# Patient Record
Sex: Female | Born: 1940 | Race: Black or African American | Hispanic: No | Smoking: Never smoker
Health system: Southern US, Community
[De-identification: ages and names within clinical notes are randomized; demographics above are authoritative.]

## PROBLEM LIST (undated history)

## (undated) DIAGNOSIS — I10 Essential (primary) hypertension: Secondary | ICD-10-CM

## (undated) DIAGNOSIS — H269 Unspecified cataract: Secondary | ICD-10-CM

## (undated) DIAGNOSIS — M1990 Unspecified osteoarthritis, unspecified site: Secondary | ICD-10-CM

## (undated) DIAGNOSIS — N289 Disorder of kidney and ureter, unspecified: Secondary | ICD-10-CM

## (undated) DIAGNOSIS — R7302 Impaired glucose tolerance (oral): Secondary | ICD-10-CM

## (undated) HISTORY — DX: Essential (primary) hypertension: I10

## (undated) HISTORY — DX: Impaired glucose tolerance (oral): R73.02

## (undated) HISTORY — DX: Unspecified osteoarthritis, unspecified site: M19.90

## (undated) HISTORY — DX: Disorder of kidney and ureter, unspecified: N28.9

## (undated) HISTORY — PX: COLONOSCOPY: SHX174

## (undated) HISTORY — DX: Unspecified cataract: H26.9

---

## 2012-02-24 ENCOUNTER — Ambulatory Visit (INDEPENDENT_AMBULATORY_CARE_PROVIDER_SITE_OTHER): Payer: Medicare Other | Admitting: Emergency Medicine

## 2012-02-24 VITALS — BP 160/80 | HR 88 | Temp 98.3°F | Resp 18 | Ht 65.5 in | Wt 266.0 lb

## 2012-02-24 DIAGNOSIS — J40 Bronchitis, not specified as acute or chronic: Secondary | ICD-10-CM | POA: Diagnosis not present

## 2012-02-24 DIAGNOSIS — E539 Vitamin B deficiency, unspecified: Secondary | ICD-10-CM

## 2012-02-24 DIAGNOSIS — J018 Other acute sinusitis: Secondary | ICD-10-CM

## 2012-02-24 DIAGNOSIS — J329 Chronic sinusitis, unspecified: Secondary | ICD-10-CM

## 2012-02-24 MED ORDER — AMOXICILLIN-POT CLAVULANATE 875-125 MG PO TABS: 1.0000 | ORAL_TABLET | Freq: Two times a day (BID) | ORAL | Status: AC

## 2012-02-24 NOTE — Progress Notes (Signed)
  Subjective:    Patient ID: Tammy Mccullough, female    DOB: 1940/10/14, 71 y.o.   MRN: [id number]  Cough This is a new problem. The current episode started in the past 7 days. The problem has been unchanged. The problem occurs hourly. The cough is productive of sputum. Associated symptoms include nasal congestion, postnasal drip and a sore throat. Pertinent negatives include no chest pain, chills, ear congestion, ear pain, fever, headaches, heartburn, hemoptysis, myalgias, rash, rhinorrhea, shortness of breath, sweats, weight loss or wheezing. The symptoms are aggravated by lying down. She has tried nothing for the symptoms. There is no history of asthma, bronchiectasis, bronchitis, COPD, emphysema, environmental allergies or pneumonia.      Review of Systems  Constitutional: Negative.  Negative for fever, chills and weight loss.  HENT: Positive for congestion, sore throat and postnasal drip. Negative for ear pain, nosebleeds, rhinorrhea, sneezing, neck stiffness, tinnitus and ear discharge.   Eyes: Negative.   Respiratory: Positive for cough. Negative for hemoptysis, choking, chest tightness, shortness of breath and wheezing.   Cardiovascular: Negative.  Negative for chest pain.  Gastrointestinal: Negative.  Negative for heartburn.  Genitourinary: Negative.   Musculoskeletal: Negative.  Negative for myalgias.  Skin: Negative for rash.  Neurological: Negative.  Negative for headaches.  Hematological: Negative for environmental allergies.       Objective:   Physical Exam  Nursing note and vitals reviewed. Constitutional: She is oriented to person, place, and time. She appears well-developed and well-nourished.  HENT:  Head: Normocephalic and atraumatic.  Right Ear: External ear normal.  Left Ear: External ear normal.  Mouth/Throat: Oropharyngeal exudate present.  Eyes: Conjunctivae and EOM are normal. Pupils are equal, round, and reactive to light.  Neck: Normal range of motion.  Neck supple.  Cardiovascular: Normal rate, regular rhythm and normal heart sounds.   Pulmonary/Chest: Effort normal and breath sounds normal.  Abdominal: Soft.  Musculoskeletal: Normal range of motion.  Neurological: She is alert and oriented to person, place, and time.  Skin: Skin is warm and dry.          Assessment & Plan:  Sinusitis Bronchitis augmentin Follow up as needed

## 2012-04-15 ENCOUNTER — Ambulatory Visit: Payer: Medicare Other

## 2012-04-15 ENCOUNTER — Ambulatory Visit (INDEPENDENT_AMBULATORY_CARE_PROVIDER_SITE_OTHER): Payer: Medicare Other | Admitting: Family Medicine

## 2012-04-15 VITALS — BP 126/74 | HR 84 | Temp 98.4°F | Resp 17 | Ht 65.0 in | Wt 262.0 lb

## 2012-04-15 DIAGNOSIS — I10 Essential (primary) hypertension: Secondary | ICD-10-CM | POA: Diagnosis not present

## 2012-04-15 MED ORDER — VALSARTAN-HYDROCHLOROTHIAZIDE 320-25 MG PO TABS: 1.0000 | ORAL_TABLET | Freq: Every day | ORAL | Status: DC

## 2012-04-15 NOTE — Patient Instructions (Addendum)
1. Hypertension  CBC with Differential, Comprehensive metabolic panel    We will schedule you for a six month follow-up and physical for 12/2012.    Hypertension As your heart beats, it forces blood through your arteries. This force is your blood pressure. If the pressure is too high, it is called hypertension (HTN) or high blood pressure. HTN is dangerous because you may have it and not know it. High blood pressure may mean that your heart has to work harder to pump blood. Your arteries may be narrow or stiff. The extra work puts you at risk for heart disease, stroke, and other problems.  Blood pressure consists of two numbers, a higher number over a lower, 110/72, for example. It is stated as "110 over 72." The ideal is below 120 for the top number (systolic) and under 80 for the bottom (diastolic). Write down your blood pressure today. You should pay close attention to your blood pressure if you have certain conditions such as:  Heart failure.   Prior heart attack.   Diabetes   Chronic kidney disease.   Prior stroke.   Multiple risk factors for heart disease.  To see if you have HTN, your blood pressure should be measured while you are seated with your arm held at the level of the heart. It should be measured at least twice. A one-time elevated blood pressure reading (especially in the Emergency Department) does not mean that you need treatment. There may be conditions in which the blood pressure is different between your right and left arms. It is important to see your caregiver soon for a recheck. Most people have essential hypertension which means that there is not a specific cause. This type of high blood pressure may be lowered by changing lifestyle factors such as:  Stress.   Smoking.   Lack of exercise.   Excessive weight.   Drug/tobacco/alcohol use.   Eating less salt.  Most people do not have symptoms from high blood pressure until it has caused damage to the body.  Effective treatment can often prevent, delay or reduce that damage. TREATMENT  When a cause has been identified, treatment for high blood pressure is directed at the cause. There are a large number of medications to treat HTN. These fall into several categories, and your caregiver will help you select the medicines that are best for you. Medications may have side effects. You should review side effects with your caregiver. If your blood pressure stays high after you have made lifestyle changes or started on medicines,   Your medication(s) may need to be changed.   Other problems may need to be addressed.   Be certain you understand your prescriptions, and know how and when to take your medicine.   Be sure to follow up with your caregiver within the time frame advised (usually within two weeks) to have your blood pressure rechecked and to review your medications.   If you are taking more than one medicine to lower your blood pressure, make sure you know how and at what times they should be taken. Taking two medicines at the same time can result in blood pressure that is too low.  SEEK IMMEDIATE MEDICAL CARE IF:  You develop a severe headache, blurred or changing vision, or confusion.   You have unusual weakness or numbness, or a faint feeling.   You have severe chest or abdominal pain, vomiting, or breathing problems.  MAKE SURE YOU:   Understand these instructions.   Will  watch your condition.   Will get help right away if you are not doing well or get worse.  Document Released: 08/03/2005 Document Revised: 07/23/2011 Document Reviewed: 03/23/2008 [facility] Patient Information 2012 [place], [place].

## 2012-04-15 NOTE — Progress Notes (Signed)
Subjective:    Patient ID: Tammy Mccullough, female    DOB: [birth year], 71 y.o.   MRN: [id number]  HPIThis 71 y.o. female presents for evaluation of HTN.  Just moved from [place], [place]; needs refill on Diovan/HCTZ 320-25 one daily.  Diagnosed with HTN 2000.  No chest pain, shortness of breath,palpitations, leg swelling.   Some R foot swelling in ankle.  No side effects to medication.  PMH:  HTN 2000, glaucoma.   Psurg:  None All:  NKDA Medications:  As above. Social:  Widowed since 2010; married/separated x 15 years; 2 children; no grandchildren.  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  Living with son disabled.   Family: M---deceased at 40; DMII complications, heart murmur, mild CVA age 51.  F -- died age 69; emphysema.   Siblings 7 brothers, 1 sister --- DMII, HTN, hyperlipidemia.    Health Maintenance:  CPE 12/2011.  Pap smear 12/2011.  Mammogram 12/2011.  Colonoscopy 2005; no polyps.  Pneumovax never.  Zostavax never.  Influenza vaccine never.  Tetanus vaccine less than 10 years.     Review of Systems  Constitutional: Negative for fever, chills, diaphoresis and fatigue.  Respiratory: Negative for cough, chest tightness, shortness of breath and wheezing.   Cardiovascular: Negative for chest pain, palpitations and leg swelling.  Gastrointestinal: Negative for nausea, vomiting, abdominal pain, diarrhea and constipation.  Neurological: Negative for dizziness, syncope, facial asymmetry, weakness, light-headedness, numbness and headaches.    Past Medical History  Diagnosis Date  . Glaucoma   . Hypertension     No past surgical history on file.  Prior to Admission medications   Medication Sig Start Date End Date Taking? Authorizing Provider  bimatoprost (LUMIGAN) 0.01 % SOLN 1 drop at bedtime.   Yes Historical Provider, MD  valsartan (DIOVAN) 40 MG tablet Take 40 mg by mouth daily.   Yes  Historical Provider, MD  valsartan-hydrochlorothiazide (DIOVAN HCT) 320-25 MG per tablet Take 1 tablet by mouth daily. 04/15/12 04/15/13  Ethelda Chick, MD    No Known Allergies  History   Social History  . Marital Status: Single    Spouse Name: N/A    Number of Children: N/A  . Years of Education: N/A   Occupational History  . Not on file.   Social History Main Topics  . Smoking status: Never Smoker   . Smokeless tobacco: Not on file  . Alcohol Use: 3.0 oz/week    6 drink(s) per week  . Drug Use: No  . Sexually Active: Not Currently   Other Topics Concern  . Not on file   Social History Narrative   Marital status: widowed since 2010 after 15 years of marriage.   Children: 2   Living with: son who is disabled.   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    No family history on file.      Objective:   Physical Exam  Nursing note and vitals reviewed. Constitutional: She is oriented to person, place, and time. She appears well-developed and well-nourished. No distress.  HENT:  Head: Normocephalic and atraumatic.  Eyes: Conjunctivae normal are normal. Pupils are equal, round, and reactive to light.  Neck: Normal range of motion. Neck supple. No JVD present. No thyromegaly present.  Cardiovascular: Normal rate, normal heart  sounds and intact distal pulses.  Exam reveals no gallop and no friction rub.   No murmur heard. Pulmonary/Chest: Effort normal and breath sounds normal. No respiratory distress. She has no wheezes. She has no rales.  Abdominal: Soft. Bowel sounds are normal. She exhibits no distension. There is no tenderness. There is no rebound and no guarding.  Lymphadenopathy:    She has no cervical adenopathy.  Neurological: She is alert and oriented to person, place, and time. No cranial nerve deficit. She exhibits normal muscle tone.    Skin: She is not diaphoretic.  Psychiatric: She has a normal mood and affect. Her behavior is normal. Judgment and thought content normal.        Assessment & Plan:   1. Hypertension  CBC with Differential, Comprehensive metabolic panel     1. HTN: controlled; no change in management today; obtain labs; refills provided.   2.  Glaucoma: stable; needs to establish with ophthalmology locally.

## 2012-04-16 LAB — CBC WITH DIFFERENTIAL/PLATELET
Basophils Absolute: 0 K/uL (ref 0.0–0.1)
Basophils Relative: 0 % (ref 0–1)
Eosinophils Absolute: 0.3 K/uL (ref 0.0–0.7)
Eosinophils Relative: 6 % — ABNORMAL HIGH (ref 0–5)
HCT: 38.6 % (ref 36.0–46.0)
Hemoglobin: 13.5 g/dL (ref 12.0–15.0)
Lymphocytes Relative: 38 % (ref 12–46)
Lymphs Abs: 1.7 K/uL (ref 0.7–4.0)
MCH: 31.1 pg (ref 26.0–34.0)
MCHC: 35 g/dL (ref 30.0–36.0)
MCV: 88.9 fL (ref 78.0–100.0)
Monocytes Absolute: 0.4 K/uL (ref 0.1–1.0)
Monocytes Relative: 8 % (ref 3–12)
Neutro Abs: 2.2 K/uL (ref 1.7–7.7)
Neutrophils Relative %: 48 % (ref 43–77)
Platelets: 214 K/uL (ref 150–400)
RBC: 4.34 MIL/uL (ref 3.87–5.11)
RDW: 14.2 % (ref 11.5–15.5)
WBC: 4.6 K/uL (ref 4.0–10.5)

## 2012-04-16 LAB — COMPREHENSIVE METABOLIC PANEL
ALT: 16 U/L (ref 0–35)
AST: 22 U/L (ref 0–37)
Albumin: 4.1 g/dL (ref 3.5–5.2)
Calcium: 9.1 mg/dL (ref 8.4–10.5)
Chloride: 105 mEq/L (ref 96–112)
Potassium: 3.8 mEq/L (ref 3.5–5.3)

## 2012-05-18 ENCOUNTER — Encounter: Payer: Self-pay | Admitting: Family Medicine

## 2012-05-20 NOTE — Progress Notes (Signed)
Reviewed and agree.

## 2012-08-04 DIAGNOSIS — L03039 Cellulitis of unspecified toe: Secondary | ICD-10-CM | POA: Diagnosis not present

## 2012-08-04 DIAGNOSIS — L600 Ingrowing nail: Secondary | ICD-10-CM | POA: Diagnosis not present

## 2012-11-17 DIAGNOSIS — H409 Unspecified glaucoma: Secondary | ICD-10-CM | POA: Diagnosis not present

## 2012-11-17 DIAGNOSIS — H4011X0 Primary open-angle glaucoma, stage unspecified: Secondary | ICD-10-CM | POA: Diagnosis not present

## 2012-11-17 DIAGNOSIS — H2510 Age-related nuclear cataract, unspecified eye: Secondary | ICD-10-CM | POA: Diagnosis not present

## 2012-11-17 DIAGNOSIS — H25019 Cortical age-related cataract, unspecified eye: Secondary | ICD-10-CM | POA: Diagnosis not present

## 2012-12-21 ENCOUNTER — Other Ambulatory Visit: Payer: Self-pay | Admitting: Family Medicine

## 2012-12-22 ENCOUNTER — Ambulatory Visit (INDEPENDENT_AMBULATORY_CARE_PROVIDER_SITE_OTHER): Payer: Medicare Other | Admitting: Family Medicine

## 2012-12-22 VITALS — BP 140/90 | HR 86 | Temp 98.4°F | Resp 18 | Ht 65.0 in | Wt 252.0 lb

## 2012-12-22 DIAGNOSIS — N289 Disorder of kidney and ureter, unspecified: Secondary | ICD-10-CM

## 2012-12-22 DIAGNOSIS — I10 Essential (primary) hypertension: Secondary | ICD-10-CM | POA: Diagnosis not present

## 2012-12-22 LAB — COMPREHENSIVE METABOLIC PANEL
AST: 24 U/L (ref 0–37)
Albumin: 3.9 g/dL (ref 3.5–5.2)
Alkaline Phosphatase: 59 U/L (ref 39–117)
BUN: 16 mg/dL (ref 6–23)
Potassium: 3.9 mEq/L (ref 3.5–5.3)
Sodium: 137 mEq/L (ref 135–145)
Total Bilirubin: 1.4 mg/dL — ABNORMAL HIGH (ref 0.3–1.2)

## 2012-12-22 LAB — CBC
HCT: 40.9 % (ref 36.0–46.0)
MCH: 30.6 pg (ref 26.0–34.0)
MCHC: 33.5 g/dL (ref 30.0–36.0)
RDW: 13.7 % (ref 11.5–15.5)

## 2012-12-22 MED ORDER — VALSARTAN-HYDROCHLOROTHIAZIDE 320-25 MG PO TABS: 1.0000 | ORAL_TABLET | Freq: Every day | ORAL | Status: DC

## 2012-12-22 NOTE — Patient Instructions (Signed)
Continue current medications.  Always make sure you drink plenty of water do not let yourself get dehydrated  Return in 6 months, which would be early November. Sooner if needed.

## 2012-12-22 NOTE — Progress Notes (Signed)
Subjective: 72 year old lady who is basically pretty healthy. She goes to water aerobics 3 or 4 times a week. She takes her blood pressure medicine faithfully, except she ran out this past weekend. The pharmacy gave her 3 pills while the prescription was pending, then she got notice that she needed to come in and be seen. She's been out of medicine for about 3 days. She does not smoke. She's not been having any chest pains palpitations or excessive shortness of breath. She does have a little swelling in ankles occasionally, but not of the problem.  Objective: Reviewed last years labs which had mild renal insufficiency. Her chest was clear. Heart regular without murmurs. No ankle edema today.Alert.  Oriented.  No bruits.  Assessment: Hypertension Mild renal insufficiency  Plan: Check CBC and metabolic panel.  Refill medicat

## 2012-12-24 ENCOUNTER — Encounter: Payer: Self-pay | Admitting: Family Medicine

## 2012-12-26 ENCOUNTER — Encounter: Payer: Self-pay | Admitting: Family Medicine

## 2013-02-09 ENCOUNTER — Ambulatory Visit (INDEPENDENT_AMBULATORY_CARE_PROVIDER_SITE_OTHER): Payer: Medicare Other | Admitting: Family Medicine

## 2013-02-09 ENCOUNTER — Ambulatory Visit: Payer: Medicare Other

## 2013-02-09 VITALS — BP 126/72 | HR 78 | Temp 97.7°F | Resp 18 | Ht 65.0 in | Wt 247.0 lb

## 2013-02-09 DIAGNOSIS — H409 Unspecified glaucoma: Secondary | ICD-10-CM

## 2013-02-09 DIAGNOSIS — M25552 Pain in left hip: Secondary | ICD-10-CM

## 2013-02-09 DIAGNOSIS — M25559 Pain in unspecified hip: Secondary | ICD-10-CM | POA: Diagnosis not present

## 2013-02-09 DIAGNOSIS — I10 Essential (primary) hypertension: Secondary | ICD-10-CM

## 2013-02-09 NOTE — Progress Notes (Signed)
[facility] [address] [place] [address] [phone]  Date:  02/09/2013   Name:  Tammy Mccullough   DOB:  12-Sep-1940   MRN:  [id number]  PCP:  Nilda Simmer, MD    Chief Complaint: Leg Pain   History of Present Illness:  Tammy Mccullough is a 72 y.o. very pleasant female patient who presents with the following:  Here today with a pain in her right leg (hip and buttock area).  She does water aerobics for exercise, and is not sure if she might have hurt herself there.  She noted that pain more when she is in bed at night.  When she gets up and is active during the day it gets better. She has noted this sx for about 2 weeks, off and on.  She will feel fine for 2 or 3 days, but then she will have some pain at night once again.   Just the left leg is affected.   She notes intermittent numbness on the lateral leg/ down the sciatic nerve root.  This is not persistent.   She has not noted any weakness.  No bowel or bladder control issues.  She is not aware of any particular injury.  She does her aerobics 3 or 4x a week.   She has tried some aleve, but her sx are intermittent so she is not sure if it is working.  She has not used anything consistently.   There are no active problems to display for this patient.   Past Medical History  Diagnosis Date  . Glaucoma   . Hypertension   . Arthritis     History reviewed. No pertinent past surgical history.  History  Substance Use Topics  . Smoking status: Never Smoker   . Smokeless tobacco: Not on file  . Alcohol Use: 3.0 oz/week    6 drink(s) per week    History reviewed. No pertinent family history.  No Known Allergies  Medication list has been reviewed and updated.  Current Outpatient Prescriptions on File Prior to Visit  Medication Sig Dispense Refill  . bimatoprost (LUMIGAN) 0.01 % SOLN 1 drop at bedtime.      . valsartan-hydrochlorothiazide (DIOVAN HCT) 320-25 MG per tablet Take 1 tablet by  mouth daily.  90 tablet  3  . valsartan (DIOVAN) 40 MG tablet Take 40 mg by mouth daily.       No current facility-administered medications on file prior to visit.    Review of Systems:  As per HPI- otherwise negative.   Physical Examination: Filed Vitals:   02/09/13 1039  BP: 126/72  Pulse: 78  Temp: 97.7 F (36.5 C)  Resp: 18   Filed Vitals:   02/09/13 1039  Height: 5\' 5"  (1.651 m)  Weight: 247 lb (112.038 kg)   Body mass index is 41.1 kg/(m^2). Ideal Body Weight: Weight in (lb) to have BMI = 25: 149.9  GEN: WDWN, NAD, Non-toxic, A & O x 3, obese, looks well HEENT: Atraumatic, Normocephalic. Neck supple. No masses, No LAD. Ears and Nose: No external deformity. CV: RRR, No M/G/R. No JVD. No thrill. No extra heart sounds. PULM: CTA B, no wheezes, crackles, rhonchi. No retractions. No resp. distress. No accessory muscle use. ABD: S, NT, ND EXTR: No c/c/e NEURO Normal gait.  PSYCH: Normally interactive. Conversant. Not depressed or anxious appearing.  Calm demeanor.  She has mild tenderness in the left glute.  Not tender over the greater trochanter.  Normal LE strength, sensation and DTR bilaterally, no saddle anesthesia.   Normal motion of the hip, no pain with internal/ external rotation of the left hip.    [facility] reading (PRIMARY) by  Dr. Patsy Lager. Left hip: negative, mild joint space loss.   LEFT HIP - COMPLETE 2+ VIEW  Comparison: None.  Findings: No trauma or bone destruction. Mild to moderate narrowing of both tip joints without erosions or other acute findings. The frontal and lateral views of the proximal left femur show no trauma or bone destruction and no soft tissue abnormalities.  IMPRESSION: Degenerative changes of both hip joints. No acute findings.  Clinically significant discrepancy from primary report, if provided: None  Assessment and Plan: Pain in left hip - Plan: DG Hip Complete Left  Likely strain of the left glute/ arthritis of the  hip.  So far she has not tried much besides occasional aleve.  Recommended that she try taking tylenol before bed consistently for the next week or so.  If not better she can call and I will rx something else.  However, given her age will try tylenol first.   Signed Abbe Amsterdam, MD

## 2013-02-09 NOTE — Patient Instructions (Addendum)
Your x-rays look good.  Try taking a dose of tylenol before bed every night for a week. If you are not feeling better please give me a call- Sooner if worse.   If you do not get better we can try calling in a muscle relaxer or stronger pain medication

## 2013-04-03 DIAGNOSIS — L259 Unspecified contact dermatitis, unspecified cause: Secondary | ICD-10-CM | POA: Diagnosis not present

## 2013-04-03 DIAGNOSIS — I872 Venous insufficiency (chronic) (peripheral): Secondary | ICD-10-CM | POA: Diagnosis not present

## 2013-05-03 DIAGNOSIS — Z23 Encounter for immunization: Secondary | ICD-10-CM | POA: Diagnosis not present

## 2013-05-05 ENCOUNTER — Ambulatory Visit (INDEPENDENT_AMBULATORY_CARE_PROVIDER_SITE_OTHER): Payer: Medicare Other | Admitting: Family Medicine

## 2013-05-05 VITALS — BP 124/64 | HR 88 | Temp 98.6°F | Resp 16 | Ht 65.0 in | Wt 246.8 lb

## 2013-05-05 DIAGNOSIS — M25559 Pain in unspecified hip: Secondary | ICD-10-CM

## 2013-05-05 DIAGNOSIS — R799 Abnormal finding of blood chemistry, unspecified: Secondary | ICD-10-CM

## 2013-05-05 DIAGNOSIS — R209 Unspecified disturbances of skin sensation: Secondary | ICD-10-CM | POA: Diagnosis not present

## 2013-05-05 DIAGNOSIS — R202 Paresthesia of skin: Secondary | ICD-10-CM

## 2013-05-05 DIAGNOSIS — R7989 Other specified abnormal findings of blood chemistry: Secondary | ICD-10-CM | POA: Diagnosis not present

## 2013-05-05 DIAGNOSIS — M25551 Pain in right hip: Secondary | ICD-10-CM

## 2013-05-05 LAB — BASIC METABOLIC PANEL
BUN: 26 mg/dL — ABNORMAL HIGH (ref 6–23)
Potassium: 3.6 mEq/L (ref 3.5–5.3)
Sodium: 139 mEq/L (ref 135–145)

## 2013-05-05 LAB — BASIC METABOLIC PANEL WITH GFR
CO2: 29 meq/L (ref 19–32)
Calcium: 9.5 mg/dL (ref 8.4–10.5)
Chloride: 101 meq/L (ref 96–112)
Creat: 1.25 mg/dL — ABNORMAL HIGH (ref 0.50–1.10)
Glucose, Bld: 141 mg/dL — ABNORMAL HIGH (ref 70–99)

## 2013-05-05 LAB — POCT GLYCOSYLATED HEMOGLOBIN (HGB A1C): Hemoglobin A1C: 5.4

## 2013-05-05 NOTE — Progress Notes (Signed)
[facility]:  Office Visit  Chief Complaint:  Chief Complaint  Patient presents with  . Hip Pain    (L) hip feels hot inside, numb at times    HPI: Tammy Mccullough is a 72 y.o. female who complains ofhere with a recurrent problem last seen in 01/2013 : "burning inside her left knee, inside herleft  leg intermittnetly for 1 week at night" along the outside of her left hip and leg, sometimes behind her leg but mostly on the outside and also the front of her leg.  She changes position and that helps, She does not get it during the day, just at night. She has been taking tylenol with some relief.  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  Sometimeines she feels nubmness, but describes it mostly as burning pain, there is no tingling no weakness, Denies incontinence.  Still does water aerobics without problems. She has arthritis on xray. Denies back, hip or knee problems.    This was  A note from prior OV in 02/09/2013. Tammy Mccullough is a 72 y.o. very pleasant female patient who presents with the following:  Here today with a pain in her right leg (hip and buttock area). She does water aerobics for exercise, and is not sure if she might have hurt herself there. She noted that pain more when she is in bed at night. When she gets up and is active during the day it gets better.  She has noted this sx for about 2 weeks, off and on. She will feel fine for 2 or 3 days, but then she will have some pain at night once again.  Just the left leg is affected.  She notes intermittent numbness on the lateral leg/ down the sciatic nerve root. This is not persistent.  She has not noted any weakness.  No bowel or bladder control issues.  She is not aware of any particular injury. She does her aerobics 3 or 4x a week.  She has tried some aleve, but her sx are intermittent so she  is not sure if it is working. She has not used anything consistently.    Past Medical History  Diagnosis Date  . Glaucoma   . Hypertension   . Arthritis   . Cataract    History reviewed. No pertinent past surgical history. History   Social History  . Marital Status: Single    Spouse Name: N/A    Number of Children: N/A  . Years of Education: N/A   Social History Main Topics  . Smoking status: Never Smoker   . Smokeless tobacco: None  . Alcohol Use: 3.0 oz/week    6 drink(s) per week  . Drug Use: No  . Sexual Activity: Not Currently   Other Topics Concern  . None   Social History Narrative   Marital status: widowed since 2010 after 15 years of marriage.      Children: 2      Living with: son who is disabled.      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   History reviewed. No pertinent family history. No Known Allergies  Prior to Admission medications   Medication Sig Start Date End Date Taking? Authorizing Provider  bimatoprost (LUMIGAN) 0.01 % SOLN 1 drop at bedtime.   Yes Historical Provider, MD  valsartan-hydrochlorothiazide (DIOVAN HCT) 320-25 MG per tablet Take 1 tablet by mouth daily. 12/22/12 12/22/13 Yes Peyton Najjar, MD     ROS: The patient denies fevers, chills, night sweats, unintentional weight loss, chest pain, palpitations, wheezing, dyspnea on exertion, nausea, vomiting, abdominal pain, dysuria, hematuria, melena, + numbness, no weakness, no tingling.   All other systems have been reviewed and were otherwise negative with the exception of those mentioned in the HPI and as above.    PHYSICAL EXAM: Filed Vitals:   05/05/13 0947  BP: 124/64  Pulse: 88  Temp: 98.6 F (37 C)  Resp: 16   Filed Vitals:   05/05/13 0947  Height: 5\' 5"  (1.651 m)  Weight: 246 lb 12.8 oz (111.948 kg)   Body mass index is  41.07 kg/(m^2).  General: Alert, no acute distress HEENT:  Normocephalic, atraumatic, oropharynx patent. EOMI, PERRLA Cardiovascular:  Regular rate and rhythm, no rubs murmurs or gallops.  No Carotid bruits, radial pulse intact. No pedal edema.  Respiratory: Clear to auscultation bilaterally.  No wheezes, rales, or rhonchi.  No cyanosis, no use of accessory musculature GI: No organomegaly, abdomen is soft and non-tender, positive bowel sounds.  No masses. Skin: No rashes. Neurologic: Facial musculature symmetric. Psychiatric: Patient is appropriate throughout our interaction. Lymphatic: No cervical lymphadenopathy Musculoskeletal: Gait intact.  Lumbar spine-nontender Left hip-nl greater troch Left knee-nl Microfilament exam nl 5/5 strength, 2/2 DTRs, sensation  Intact to microfilament exam   LABS: Results for orders placed in visit on 05/05/13  POCT GLYCOSYLATED HEMOGLOBIN (HGB A1C)      Result Value Range   Hemoglobin A1C 5.4       EKG/XRAY:   Primary read interpreted by Dr. Conley Rolls at [facility].   ASSESSMENT/PLAN: Encounter Diagnoses  Name Primary?  . Paresthesia Yes  . Hip pain, right   . Abnormal blood chemistry     She does not have diabetes.  Labs pending Rx Mobic Most likely arthritis with sciatic nerve inflammation, exercises given F/u prn Gross sideeffects, risk and benefits, and alternatives of medications d/w patient. Patient is aware that all medications have potential sideeffects and we are unable to predict every sideeffect or drug-drug interaction that may occur.  Emogene Muratalla PHUONG, DO 05/05/2013 10:41 AM

## 2013-05-05 NOTE — Patient Instructions (Signed)
Piriformis Syndrome with Rehab Piriformis syndrome is a condition the affects the nervous system in the area of the hip, and is characterized by pain and possibly a loss of feeling in the backside (posterior) thigh that may extend down the entire length of the leg. The symptoms are caused by an increase in pressure on the sciatic nerve by the piriformis muscle, which is on the back of the hip and is responsible for externally rotating the hip. The sciatic nerve and its branches connect to much of the leg. Normally the sciatic nerve runs between the piriformis muscle and other muscles. However, in certain individuals the nerve runs through the muscle, which causes an increase in pressure on the nerve and results in the symptoms of piriformis syndrome. SYMPTOMS   Pain, tingling, numbness, or burning in the back of the thigh that may also extend down the entire leg.  Occasionally, tenderness in the buttock.  Loss of function of the leg.  Pain that worsens when using the piriformis muscle (running, jumping, or stairs).  Pain that increases with prolonged sitting.  Pain that is lessened by laying flat on the back. CAUSES   Piriformis syndrome is the result of an increase in pressure placed on the sciatic nerve. Often times piriformis syndrome is an overuse injury.  Stress placed on the nerve from a sudden increase in the intensity, frequency, or duration of training.  Compensation of other extremity injuries. RISK INCREASES WITH:  Sports that involve the piriformis muscle (running, walking or jumping).  You are born with (congenital) a defect in which the sciatic nerve passes through the muscle. PREVENTION  Warm up and stretch properly before activity.  Allow for adequate recovery between workouts.  Maintain physical fitness:  Strength, flexibility, and endurance.  Cardiovascular fitness. PROGNOSIS  If treated properly, then the symptoms of piriformis syndrome usually resolve in 2  to 6 weeks. RELATED COMPLICATIONS   Persistent and possibly permanent pain and numbness in the lower extremity.  Weakness of the extremity that may progress to disability and inability to compete. TREATMENT  The most effective treatment for piriformis syndrome is rest from any activities that aggravate the symptoms. Ice and pain medication may help reduce pain and inflammation. The use of strengthening and stretching exercises may help reduce pain with activity. These exercises may be performed at home or with a therapist. A referral to a therapist may be given for further evaluation and treatment, such as ultrasound. Corticosteroid injections may be given to reduce inflammation that is causing pressure to be placed on the sciatic nerve. If non-surgical (conservative) treatment is unsuccessful, then surgery may be recommended.  MEDICATION   If pain medication is necessary, then nonsteroidal anti-inflammatory medications, such as aspirin and ibuprofen, or other minor pain relievers, such as acetaminophen, are often recommended.  Do not take pain medication for 7 days before surgery.  Prescription pain relievers may be given if deemed necessary by your caregiver. Use only as directed and only as much as you need.  Corticosteroid injections may be given by your caregiver. These injections should be reserved for the most serious cases, because they may only be given a certain number of times. HEAT AND COLD:   Cold treatment (icing) relieves pain and reduces inflammation. Cold treatment should be applied for 10 to 15 minutes every 2 to 3 hours for inflammation and pain and immediately after any activity that aggravates your symptoms. Use ice packs or massage the area with a piece of ice (ice massage).    Heat treatment may be used prior to performing the stretching and strengthening activities prescribed by your caregiver, physical therapist, or athletic trainer. Use a heat pack or soak the injury in  warm water. SEEK IMMEDIATE MEDICAL CARE IF:  Treatment seems to offer no benefit, or the condition worsens.  Any medications produce adverse side effects. EXERCISES RANGE OF MOTION (ROM) AND STRETCHING EXERCISES - Piriformis Syndrome These exercises may help you when beginning to rehabilitate your injury. Your symptoms may resolve with or without further involvement from your physician, physical therapist or athletic trainer. While completing these exercises, remember:   Restoring tissue flexibility helps normal motion to return to the joints. This allows healthier, less painful movement and activity.  An effective stretch should be held for at least 30 seconds.  A stretch should never be painful. You should only feel a gentle lengthening or release in the stretched tissue. STRETCH - Hip Rotators  Lie on your back on a firm surface. Grasp your right / left knee with your right / left hand and your ankle with your opposite hand.  Keeping your hips and shoulders firmly planted, gently pull your right / left knee and rotate your lower leg toward your opposite shoulder until you feel a stretch in your buttocks.  Hold this stretch for __________ seconds. Repeat this stretch __________ times. Complete this stretch __________ times per day. STRETCH  Iliotibial Band  On the floor or bed, lie on your side so your right / left leg is on top. Bend your knee and grab your ankle.  Slowly bring your knee back so that your thigh is in line with your trunk. Keep your heel at your buttocks and gently arch your back so your head, shoulders and hips line up.  Slowly lower your leg so that your knee approaches the floor/bed until you feel a gentle stretch on the outside of your right / left thigh. If you do not feel a stretch and your knee will not fall farther, place the heel of your opposite foot on top of your knee and pull your thigh down farther.  Hold this stretch for __________ seconds. Repeat  __________ times. Complete __________ times per day. STRENGTHENING EXERCISES - Piriformis Syndrome  These are some of the caregiver again or until your symptoms are resolved. Remember:   Strong muscles with good endurance tolerate stress better.  Do the exercises as initially prescribed by your caregiver. Progress slowly with each exercise, gradually increasing the number of repetitions and weight used under their guidance. STRENGTH - Hip Abductors, Straight Leg Raises Be aware of your form throughout the entire exercise so that you exercise the correct muscles. Sloppy form means that you are not strengthening the correct muscles.  Lie on your side so that your head, shoulders, knee and hip line up. You may bend your lower knee to help maintain your balance. Your right / left leg should be on top.  Roll your hips slightly forward, so that your hips are stacked directly over each other and your right / left knee is facing forward.  Lift your top leg up 4-6 inches, leading with your heel. Be sure that your foot does not drift forward or that your knee does not roll toward the ceiling.  Hold this position for __________ seconds. You should feel the muscles in your outer hip lifting (you may not notice this until your leg begins to tire).  Slowly lower your leg to the starting position. Allow the muscles to   fully relax before beginning the next repetition. Repeat __________ times. Complete this exercise __________ times per day.  STRENGTH - Hip Abductors, Quadriped  On a firm, lightly padded surface, position yourself on your hands and knees. Your hands should be directly below your shoulders and your knees should be directly below your hips.  Keeping your right / left knee bent, lift your leg out to the side. Keep your legs level and in line with your shoulders.  Position yourself on your hands and knees.  Hold for __________ seconds.  Keeping your trunk steady and your hips level, slowly  lower your leg to the starting position. Repeat __________ times. Complete this exercise __________ times per day.  STRENGTH - Hip Abductors, Standing  Tie one end of a rubber exercise band/tubing to a secure surface (table, pole) and tie a loop at the other end.  Place the loop around your right / left ankle. Keeping your ankle with the band directly opposite of the secured end, step away until there is tension in the tube/band.  Hold onto a chair as needed for balance.  Keeping your back upright, your shoulders over your hips, and your toes pointing forward, lift your right / left leg out to your side. Be sure to lift your leg with your hip muscles. Do not "throw" your leg or tip your body to lift your leg.  Slowly and with control, return to the starting position. Repeat exercise __________ times. Complete this exercise __________ times per day.  Document Released: 08/03/2005 Document Revised: 02/02/2012 Document Reviewed: 11/15/2008 [employer] Patient Information 2014 [employer].  

## 2013-05-06 ENCOUNTER — Other Ambulatory Visit: Payer: Self-pay | Admitting: *Deleted

## 2013-05-06 MED ORDER — MELOXICAM 7.5 MG PO TABS: 7.5000 mg | ORAL_TABLET | Freq: Two times a day (BID) | ORAL | Status: DC

## 2013-05-06 NOTE — Telephone Encounter (Signed)
rx for mobic was not sent in. rx sent in.  lmom that rx was sent in.

## 2013-05-09 DIAGNOSIS — H4011X0 Primary open-angle glaucoma, stage unspecified: Secondary | ICD-10-CM | POA: Diagnosis not present

## 2013-05-09 DIAGNOSIS — H409 Unspecified glaucoma: Secondary | ICD-10-CM | POA: Diagnosis not present

## 2013-09-23 DIAGNOSIS — K529 Noninfective gastroenteritis and colitis, unspecified: Secondary | ICD-10-CM | POA: Diagnosis not present

## 2013-11-07 DIAGNOSIS — H2510 Age-related nuclear cataract, unspecified eye: Secondary | ICD-10-CM | POA: Diagnosis not present

## 2013-11-07 DIAGNOSIS — H25019 Cortical age-related cataract, unspecified eye: Secondary | ICD-10-CM | POA: Diagnosis not present

## 2013-11-07 DIAGNOSIS — H4011X0 Primary open-angle glaucoma, stage unspecified: Secondary | ICD-10-CM | POA: Diagnosis not present

## 2013-11-07 DIAGNOSIS — H409 Unspecified glaucoma: Secondary | ICD-10-CM | POA: Diagnosis not present

## 2014-02-27 ENCOUNTER — Ambulatory Visit (INDEPENDENT_AMBULATORY_CARE_PROVIDER_SITE_OTHER): Payer: Medicare Other | Admitting: Family Medicine

## 2014-02-27 VITALS — BP 132/80 | HR 81 | Temp 98.1°F | Resp 16 | Ht 65.0 in | Wt 239.4 lb

## 2014-02-27 DIAGNOSIS — R799 Abnormal finding of blood chemistry, unspecified: Secondary | ICD-10-CM

## 2014-02-27 DIAGNOSIS — R7309 Other abnormal glucose: Secondary | ICD-10-CM | POA: Diagnosis not present

## 2014-02-27 DIAGNOSIS — R739 Hyperglycemia, unspecified: Secondary | ICD-10-CM

## 2014-02-27 DIAGNOSIS — I10 Essential (primary) hypertension: Secondary | ICD-10-CM

## 2014-02-27 DIAGNOSIS — R7989 Other specified abnormal findings of blood chemistry: Secondary | ICD-10-CM

## 2014-02-27 LAB — BASIC METABOLIC PANEL WITH GFR
BUN: 27 mg/dL — AB (ref 6–23)
CALCIUM: 9.6 mg/dL (ref 8.4–10.5)
CO2: 27 mEq/L (ref 19–32)
CREATININE: 1.22 mg/dL — AB (ref 0.50–1.10)
Chloride: 102 mEq/L (ref 96–112)
GFR, EST NON AFRICAN AMERICAN: 43 mL/min — AB
GFR, Est African American: 50 mL/min — ABNORMAL LOW
Glucose, Bld: 92 mg/dL (ref 70–99)
Potassium: 4.3 mEq/L (ref 3.5–5.3)
Sodium: 139 mEq/L (ref 135–145)

## 2014-02-27 MED ORDER — VALSARTAN-HYDROCHLOROTHIAZIDE 320-25 MG PO TABS: 1.0000 | ORAL_TABLET | Freq: Every day | ORAL | Status: DC

## 2014-02-27 NOTE — Progress Notes (Addendum)
Subjective:  This chart was scribed for Merri Ray, MD, by Neta Ehlers, Medical Scribe. This  patient's care was started at 9:41 AM.   Patient ID: Tammy Mccullough, female    DOB: 08/23/1940, 73 y.o.   MRN: [id number]  HPI  Tammy Mccullough is a 73 y.o. female.   PCP: Reginia Forts, MD.    Dr. Tamala Julian is the pt's PCP, but Tammy Mccullough has seen a few other providers since her last visit with Dr. Tamala Julian two years ago in August 2013. Tammy Mccullough is here for a medication refill and for follow-up labs from last visit. The pt is not fasting today.   1. HTN: The pt takes Dilovin HCT 320-25 qd. One year of refills was given in May of last year. Her last creatine was 1.25 in September of last year, which is up slightly from May 2014 at 1.06, but her creatine had been 1.2 in August 2013. She had been prescribed Mobic for hip pain twice per day as needed. Tammy Mccullough was advised on lab work to not use Mobic more than once per day due to elevated kidney function, and only to use the second pill if needed. She was advised to repeat kidney function in 1-3 months. Her glucose was also elevated at that visit at 141, but the AIC normal at 5.4.   In the office, Tammy Mccullough reports she has not taken Mobic a lot. However, she reports she has used Aleve 2-3 times a week, though this week she transitioned to Tylenol.   She reports the HTN medication will be finished in two days.   Tammy Mccullough denies chest pain, palpitations, SOB, abdominal pain, headaches, or weakness.   She states she does water aerobics 2-3 times a week for exercise. She denies chest pain, SOB, or chest tightness with exertion.   Previous labs: Results for orders placed in visit on 78/29/56  BASIC METABOLIC PANEL      Result Value Ref Range   Sodium 139  135 - 145 mEq/L   Potassium 3.6  3.5 - 5.3 mEq/L   Chloride 101  96 - 112 mEq/L   CO2 29  19 - 32 mEq/L   Glucose, Bld 141 (*) 70 - 99 mg/dL   BUN 26 (*) 6 - 23 mg/dL   Creat 1.25 (*) 0.50 -  1.10 mg/dL   Calcium 9.5  8.4 - 10.5 mg/dL  POCT GLYCOSYLATED HEMOGLOBIN (HGB A1C)      Result Value Ref Range   Hemoglobin A1C 5.4       Patient Active Problem List   Diagnosis Date Noted  . Glaucoma 02/09/2013  . HTN (hypertension) 02/09/2013   Past Medical History  Diagnosis Date  . Glaucoma   . Hypertension   . Arthritis   . Cataract    History reviewed. No pertinent past surgical history.  No Known Allergies  Prior to Admission medications   Medication Sig Start Date End Date Taking? Authorizing Provider  bimatoprost (LUMIGAN) 0.01 % SOLN 1 drop at bedtime.   Yes Historical Provider, MD  meloxicam (MOBIC) 7.5 MG tablet Take 1 tablet (7.5 mg total) by mouth 2 (two) times daily. 05/06/13   Areta Haber Dunn, PA-C  valsartan-hydrochlorothiazide (DIOVAN HCT) 320-25 MG per tablet Take 1 tablet by mouth daily. 12/22/12 12/22/13  Posey Boyer, MD   Review of Systems  Constitutional: Negative for fatigue and unexpected weight change.  Respiratory: Negative for chest tightness and shortness of breath.   Cardiovascular:  Negative for chest pain, palpitations and leg swelling.  Gastrointestinal: Negative for abdominal pain and blood in stool.  Neurological: Negative for dizziness, syncope, light-headedness and headaches.      Objective:   Physical Exam  Vitals reviewed. Constitutional: She is oriented to person, place, and time. She appears well-developed and well-nourished.  HENT:  Head: Normocephalic and atraumatic.  Eyes: Conjunctivae and EOM are normal. Pupils are equal, round, and reactive to light.  Neck: Carotid bruit is not present.  Cardiovascular: Normal rate, regular rhythm, normal heart sounds and intact distal pulses.   Pulmonary/Chest: Effort normal and breath sounds normal.  Abdominal: Soft. She exhibits no pulsatile midline mass. There is no tenderness.  Neurological: She is alert and oriented to person, place, and time.  Skin: Skin is warm and dry.  Psychiatric:  She has a normal mood and affect. Her behavior is normal.    Filed Vitals:   02/27/14 0900  BP: 132/80  Pulse: 81  Temp: 98.1 F (36.7 C)  TempSrc: Oral  Resp: 16  Height: 5' 5"  (1.651 m)  Weight: 239 lb 6.4 oz (108.591 kg)  SpO2: 96%      Assessment & Plan:   Tammy Mccullough is a 73 y.o. female Essential hypertension - Plan: BASIC METABOLIC PANEL WITH GFR, valsartan-hydrochlorothiazide (DIOVAN HCT) 320-25 MG per tablet  -controlled, without new SE's. . No changes in meds at this time. recheck renal function, maintain hydration. Continue low intensity exercise.   Hyperglycemia - Plan: BASIC METABOLIC PANEL WITH GFR  - repeat BMP.  Prior A1c ok.   Elevated serum creatinine - Plan: BASIC METABOLIC PANEL WITH GFR  - discussed possible causes including chronic HTN, and NSAID effect. Minimize NSAID use, recheck creat with eGFR.   Plan for physical with Dr. Tamala Julian in next 6 months for continuity of care. Will message schedulers.   Meds ordered this encounter  Medications  . valsartan-hydrochlorothiazide (DIOVAN HCT) 320-25 MG per tablet    Sig: Take 1 tablet by mouth daily.    Dispense:  90 tablet    Refill:  1   Patient Instructions  You should receive a call or letter about your lab results within the next week to 10 days.  Try to avoid NSAIDS such as Alleve unless Tylenol not helping, as these can also affect your kidney function. We will call you to arrange an appointment with Dr. Tamala Julian in the next 6 months for a physical.  Return to the clinic or go to the nearest emergency room if any of your symptoms worsen or new symptoms occur.

## 2014-02-27 NOTE — Patient Instructions (Signed)
You should receive a call or letter about your lab results within the next week to 10 days.  Try to avoid NSAIDS such as Alleve unless Tylenol not helping, as these can also affect your kidney function. We will call you to arrange an appointment with Dr. Tamala Julian in the next 6 months for a physical.  Return to the clinic or go to the nearest emergency room if any of your symptoms worsen or new symptoms occur.

## 2014-05-28 ENCOUNTER — Telehealth: Payer: Self-pay

## 2014-05-28 NOTE — Telephone Encounter (Signed)
LM on cell for pt to RTC for evaluation.

## 2014-05-28 NOTE — Telephone Encounter (Signed)
Pt is having right ear problem-pain and hearing loss   Best number is [phone]

## 2014-05-29 ENCOUNTER — Ambulatory Visit (INDEPENDENT_AMBULATORY_CARE_PROVIDER_SITE_OTHER): Payer: Medicare Other | Admitting: Family Medicine

## 2014-05-29 VITALS — BP 120/70 | HR 81 | Temp 98.4°F | Resp 15 | Ht 64.0 in | Wt 237.0 lb

## 2014-05-29 DIAGNOSIS — H409 Unspecified glaucoma: Secondary | ICD-10-CM

## 2014-05-29 DIAGNOSIS — I10 Essential (primary) hypertension: Secondary | ICD-10-CM

## 2014-05-29 DIAGNOSIS — H6983 Other specified disorders of Eustachian tube, bilateral: Secondary | ICD-10-CM

## 2014-05-29 DIAGNOSIS — H6993 Unspecified Eustachian tube disorder, bilateral: Secondary | ICD-10-CM

## 2014-05-29 DIAGNOSIS — H6123 Impacted cerumen, bilateral: Secondary | ICD-10-CM

## 2014-05-29 MED ORDER — GUAIFENESIN ER 1200 MG PO TB12: 1.0000 | ORAL_TABLET | Freq: Two times a day (BID) | ORAL | Status: DC | PRN

## 2014-05-29 NOTE — Patient Instructions (Signed)
Barotitis Media Barotitis media is inflammation of your middle ear. This occurs when the auditory tube (eustachian tube) leading from the back of your nose (nasopharynx) to your eardrum is blocked. This blockage may result from a cold, environmental allergies, or an upper respiratory infection. Unresolved barotitis media may lead to damage or hearing loss (barotrauma), which may become permanent. HOME CARE INSTRUCTIONS   Use medicines as recommended by your health care provider. Over-the-counter medicines will help unblock the canal and can help during times of air travel.  Do not put anything into your ears to clean or unplug them. Eardrops will not be helpful.  Do not swim, dive, or fly until your health care provider says it is all right to do so. If these activities are necessary, chewing gum with frequent, forceful swallowing may help. It is also helpful to hold your nose and gently blow to pop your ears for equalizing pressure changes. This forces air into the eustachian tube.  Only take over-the-counter or prescription medicines for pain, discomfort, or fever as directed by your health care provider.  A decongestant may be helpful in decongesting the middle ear and make pressure equalization easier. SEEK MEDICAL CARE IF:  You experience a serious form of dizziness in which you feel as if the room is spinning and you feel nauseated (vertigo).  Your symptoms only involve one ear. SEEK IMMEDIATE MEDICAL CARE IF:   You develop a severe headache, dizziness, or severe ear pain.  You have bloody or pus-like drainage from your ears.  You develop a fever.  Your problems do not improve or become worse. MAKE SURE YOU:   Understand these instructions.  Will watch your condition.  Will get help right away if you are not doing well or get worse. Document Released: 07/31/2000 Document Revised: 05/24/2013 Document Reviewed: 02/28/2013 [facility] Patient Information 2015 [place], [place]. This  information is not intended to replace advice given to you by your health care provider. Make sure you discuss any questions you have with your health care provider.  Serous Otitis Media Serous otitis media is fluid in the middle ear space. This space contains the bones for hearing and air. Air in the middle ear space helps to transmit sound.  The air gets there through the eustachian tube. This tube goes from the back of the nose (nasopharynx) to the middle ear space. It keeps the pressure in the middle ear the same as the outside world. It also helps to drain fluid from the middle ear space. CAUSES  Serous otitis media occurs when the eustachian tube gets blocked. Blockage can come from:  Ear infections.  Colds and other upper respiratory infections.  Allergies.  Irritants such as cigarette smoke.  Sudden changes in air pressure (such as descending in an airplane).  Enlarged adenoids.  A mass in the nasopharynx. During colds and upper respiratory infections, the middle ear space can become temporarily filled with fluid. This can happen after an ear infection also. Once the infection clears, the fluid will generally drain out of the ear through the eustachian tube. If it does not, then serous otitis media occurs. SIGNS AND SYMPTOMS   Hearing loss.  A feeling of fullness in the ear, without pain.  Young children may not show any symptoms but may show slight behavioral changes, such as agitation, ear pulling, or crying. DIAGNOSIS  Serous otitis media is diagnosed by an ear exam. Tests may be done to check on the movement of the eardrum. Hearing exams may  also be done. °TREATMENT  °The fluid most often goes away without treatment. If allergy is the cause, allergy treatment may be helpful. Fluid that persists for several months may require minor surgery. A small tube is placed in the eardrum to: °· Drain the fluid. °· Restore the air in the middle ear space. °In certain situations,  antibiotic medicines are used to avoid surgery. Surgery may be done to remove enlarged adenoids (if this is the cause). °HOME CARE INSTRUCTIONS  °· Keep children away from tobacco smoke. °· Keep all follow-up visits as directed by your health care provider. °SEEK MEDICAL CARE IF:  °· Your hearing is not better in 3 months. °· Your hearing is worse. °· You have ear pain. °· You have drainage from the ear. °· You have dizziness. °· You have serous otitis media only in one ear or have any bleeding from your nose (epistaxis). °· You notice a lump on your neck. °MAKE SURE YOU: °· Understand these instructions.   °· Will watch your condition.   °· Will get help right away if you are not doing well or get worse.   °Document Released: 10/24/2003 Document Revised: 12/18/2013 Document Reviewed: 02/28/2013 °[employer]® Patient Information ©2015 [employer]. This information is not intended to replace advice given to you by your health care provider. Make sure you discuss any questions you have with your health care provider. ° °

## 2014-05-29 NOTE — Progress Notes (Signed)
Subjective:    Patient ID: Tammy Mccullough, female    DOB: September 10, 1940, 73 y.o.   MRN: [id number] This chart was scribed for Delman Cheadle, MD by Zola Button, Medical Scribe. This patient was seen in Room 12 and the patient's care was started at 9:07 AM.  Chief Complaint  Patient presents with   Ear Fullness    8 days, pt feels  echo ? clogged     HPI HPI Comments: HEAVAN Mccullough is a 73 y.o. female with a hx of HTN and glaucoma who presents to the [facility] complaining of gradual onset, constant right ear fullness that began a week ago.  She denies fever, chills, pain, congestion, sore throat, and sinus pressure.  Past Medical History  Diagnosis Date   Glaucoma    Hypertension    Arthritis    Cataract    Current Outpatient Prescriptions on File Prior to Visit  Medication Sig Dispense Refill   bimatoprost (LUMIGAN) 0.01 % SOLN 1 drop at bedtime.       valsartan-hydrochlorothiazide (DIOVAN HCT) 320-25 MG per tablet Take 1 tablet by mouth daily.  90 tablet  1   No current facility-administered medications on file prior to visit.   No Known Allergies   Review of Systems  Constitutional: Negative for fever, chills, appetite change and fatigue.  HENT: Negative for congestion, ear pain, sinus pressure and sore throat.   Eyes: Negative for discharge.  Respiratory: Negative for cough.   Cardiovascular: Negative for chest pain.  Gastrointestinal: Negative for abdominal pain and diarrhea.  Genitourinary: Negative for frequency and hematuria.  Musculoskeletal: Negative for arthralgias, back pain and myalgias.  Skin: Negative for rash.  Neurological: Negative for seizures and headaches.  Psychiatric/Behavioral: Negative for hallucinations.       Objective:  BP 120/70   Pulse 81   Temp(Src) 98.4 F (36.9 C) (Oral)   Resp 15   Ht 5\' 4"  (1.626 m)   Wt 237 lb (107.502 kg)   BMI 40.66 kg/m2   SpO2 96%  Physical Exam  Nursing note and vitals  reviewed. Constitutional: She is oriented to person, place, and time. She appears well-developed and well-nourished. No distress.  HENT:  Head: Normocephalic and atraumatic.  Nose: Nose normal. No mucosal edema.  Mouth/Throat: Oropharynx is clear and moist. No oropharyngeal exudate.  Left and right ear occluded with soft brown cerumen. Left TM with injection and retraction. Right TM with mid-ear effusion.  Eyes: Pupils are equal, round, and reactive to light.  Neck: Neck supple.  Cardiovascular: Normal rate.   Pulmonary/Chest: Effort normal.  Musculoskeletal: She exhibits no edema.  Neurological: She is alert and oriented to person, place, and time. No cranial nerve deficit.  Skin: Skin is warm and dry. No rash noted.  Psychiatric: She has a normal mood and affect. Her behavior is normal.   Successful cerumen removal bilaterally by lavage by CMA Vallarie Mare, followed by curet removal by myself. Symptoms partially resolved.       Assessment & Plan:  Will not use oral decongestant for symptoms since pt has HTN, and no nasal steroids since pt has glaucoma. Advised Mucinex and steam treatments, and return to clinic if symptoms worsen. Eustachian tube dysfunction, bilateral  Cerumen impaction, bilateral  Glaucoma  Essential hypertension  Meds ordered this encounter  Medications   Guaifenesin (MUCINEX MAXIMUM STRENGTH) 1200 MG TB12    Sig: Take 1 tablet (1,200 mg total) by mouth every 12 (twelve) hours as needed.  Dispense:  14 tablet    Refill:  1    I personally performed the services described in this documentation, which was scribed in my presence. The recorded information has been reviewed and considered, and addended by me as needed.  Delman Cheadle, MD MPH

## 2014-05-31 DIAGNOSIS — H2513 Age-related nuclear cataract, bilateral: Secondary | ICD-10-CM | POA: Diagnosis not present

## 2014-05-31 DIAGNOSIS — H4011X1 Primary open-angle glaucoma, mild stage: Secondary | ICD-10-CM | POA: Diagnosis not present

## 2014-05-31 DIAGNOSIS — H25013 Cortical age-related cataract, bilateral: Secondary | ICD-10-CM | POA: Diagnosis not present

## 2014-06-14 DIAGNOSIS — H25013 Cortical age-related cataract, bilateral: Secondary | ICD-10-CM | POA: Diagnosis not present

## 2014-06-14 DIAGNOSIS — H2513 Age-related nuclear cataract, bilateral: Secondary | ICD-10-CM | POA: Diagnosis not present

## 2014-07-17 HISTORY — PX: CATARACT EXTRACTION: SUR2

## 2014-07-24 ENCOUNTER — Telehealth: Payer: Self-pay | Admitting: Family Medicine

## 2014-07-24 NOTE — Telephone Encounter (Signed)
Patient returned call about the flu shot.   She will be in tomorrow to see Dr. Tamala Julian and will receive the flu shot at that time.

## 2014-07-24 NOTE — Telephone Encounter (Signed)
Left a message for patient to come in for a  Flu shot.

## 2014-07-25 ENCOUNTER — Encounter: Payer: Self-pay | Admitting: Family Medicine

## 2014-07-25 ENCOUNTER — Ambulatory Visit (INDEPENDENT_AMBULATORY_CARE_PROVIDER_SITE_OTHER): Payer: Medicare Other | Admitting: Family Medicine

## 2014-07-25 VITALS — BP 156/78 | HR 91 | Temp 98.1°F | Resp 16 | Ht 64.0 in | Wt 242.0 lb

## 2014-07-25 DIAGNOSIS — Z1239 Encounter for other screening for malignant neoplasm of breast: Secondary | ICD-10-CM

## 2014-07-25 DIAGNOSIS — Z0000 Encounter for general adult medical examination without abnormal findings: Secondary | ICD-10-CM

## 2014-07-25 DIAGNOSIS — Z01419 Encounter for gynecological examination (general) (routine) without abnormal findings: Secondary | ICD-10-CM | POA: Diagnosis not present

## 2014-07-25 DIAGNOSIS — E2839 Other primary ovarian failure: Secondary | ICD-10-CM

## 2014-07-25 DIAGNOSIS — Z23 Encounter for immunization: Secondary | ICD-10-CM

## 2014-07-25 DIAGNOSIS — Z13220 Encounter for screening for lipoid disorders: Secondary | ICD-10-CM | POA: Diagnosis not present

## 2014-07-25 DIAGNOSIS — Z1211 Encounter for screening for malignant neoplasm of colon: Secondary | ICD-10-CM | POA: Diagnosis not present

## 2014-07-25 DIAGNOSIS — I10 Essential (primary) hypertension: Secondary | ICD-10-CM | POA: Diagnosis not present

## 2014-07-25 LAB — LIPID PANEL
Cholesterol: 206 mg/dL — ABNORMAL HIGH (ref 0–200)
HDL: 86 mg/dL (ref >39)
LDL Cholesterol: 99 mg/dL (ref 0–99)
Total CHOL/HDL Ratio: 2.4 Ratio
Triglycerides: 105 mg/dL (ref <150)
VLDL: 21 mg/dL (ref 0–40)

## 2014-07-25 LAB — POCT URINALYSIS DIPSTICK
Bilirubin, UA: NEGATIVE
Glucose, UA: NEGATIVE
KETONES UA: NEGATIVE
LEUKOCYTES UA: NEGATIVE
NITRITE UA: NEGATIVE
PH UA: 5
PROTEIN UA: NEGATIVE
RBC UA: NEGATIVE
Spec Grav, UA: <=1.005
Urobilinogen, UA: 0.2

## 2014-07-25 LAB — CBC WITH DIFFERENTIAL/PLATELET
BASOS PCT: 0 % (ref 0–1)
Basophils Absolute: 0 10*3/uL (ref 0.0–0.1)
EOS ABS: 0.2 10*3/uL (ref 0.0–0.7)
EOS PCT: 3 % (ref 0–5)
HEMATOCRIT: 40.8 % (ref 36.0–46.0)
HEMOGLOBIN: 12.7 g/dL (ref 12.0–15.0)
LYMPHS ABS: 2.5 10*3/uL (ref 0.7–4.0)
Lymphocytes Relative: 50 % — ABNORMAL HIGH (ref 12–46)
MCH: 30.6 pg (ref 26.0–34.0)
MCHC: 31.1 g/dL (ref 30.0–36.0)
MCV: 98.3 fL (ref 78.0–100.0)
MONOS PCT: 7 % (ref 3–12)
MPV: 12.9 fL — ABNORMAL HIGH (ref 9.4–12.4)
Monocytes Absolute: 0.4 10*3/uL (ref 0.1–1.0)
NEUTROS PCT: 40 % — AB (ref 43–77)
Neutro Abs: 2 10*3/uL (ref 1.7–7.7)
Platelets: 223 10*3/uL (ref 150–400)
RBC: 4.15 MIL/uL (ref 3.87–5.11)
RDW: 13.4 % (ref 11.5–15.5)
WBC: 5 10*3/uL (ref 4.0–10.5)

## 2014-07-25 LAB — COMPREHENSIVE METABOLIC PANEL
ALT: 13 U/L (ref 0–35)
AST: 21 U/L (ref 0–37)
Albumin: 3.9 g/dL (ref 3.5–5.2)
Alkaline Phosphatase: 53 U/L (ref 39–117)
BILIRUBIN TOTAL: 1.3 mg/dL — AB (ref 0.2–1.2)
BUN: 19 mg/dL (ref 6–23)
CO2: 26 mEq/L (ref 19–32)
CREATININE: 1.06 mg/dL (ref 0.50–1.10)
Calcium: 9.1 mg/dL (ref 8.4–10.5)
Chloride: 102 mEq/L (ref 96–112)
Glucose, Bld: 115 mg/dL — ABNORMAL HIGH (ref 70–99)
Potassium: 3.6 mEq/L (ref 3.5–5.3)
Sodium: 140 mEq/L (ref 135–145)
Total Protein: 6.9 g/dL (ref 6.0–8.3)

## 2014-07-25 NOTE — Progress Notes (Addendum)
Subjective:    Patient ID: Tammy Mccullough, female    DOB: [birth year], 73 y.o.   MRN: [id number]  07/25/2014  Annual Exam and Hypertension   HPI This 73 y.o. female presents for Annual Wellness Exam.   Last physical: 04/15/2012. Pap smear:  2013 Mammogram: 2013 Colonoscopy:2005 Bone density:  never TDAP:  Less than ten years. Pneumovax:  04/2013 Zostavax: never; no previous chicken pox. Influenza: today Eye exam:  +glasses; scheduled for cataract surgery next week by [facility]. Dental exam:  In [place]; every year.   HTN: Patient reports good compliance with medication, good tolerance to medication, and good symptom control.  Denies CP/palp/SOB/leg swelling.  Review of Systems  Constitutional: Negative for fever, chills, diaphoresis, activity change, appetite change, fatigue and unexpected weight change.  HENT: Positive for sinus pressure, sneezing and sore throat. Negative for congestion, dental problem, drooling, ear discharge, ear pain, facial swelling, hearing loss, mouth sores, nosebleeds, postnasal drip, rhinorrhea, tinnitus, trouble swallowing and voice change.   Eyes: Negative for photophobia, pain, discharge, redness, itching and visual disturbance.  Respiratory: Negative for apnea, cough, choking, chest tightness, shortness of breath, wheezing and stridor.   Cardiovascular: Negative for chest pain, palpitations and leg swelling.  Gastrointestinal: Negative for nausea, vomiting, abdominal pain, diarrhea, constipation, blood in stool, abdominal distention, anal bleeding and rectal pain.  Endocrine: Negative for cold intolerance, heat intolerance, polydipsia, polyphagia and polyuria.  Genitourinary: Negative for dysuria, urgency, frequency, hematuria, flank pain, decreased urine volume, vaginal bleeding, vaginal discharge, enuresis, difficulty urinating, genital sores, vaginal pain, menstrual problem, pelvic pain and dyspareunia.  Musculoskeletal: Negative for myalgias, back  pain, joint swelling, arthralgias, gait problem, neck pain and neck stiffness.  Skin: Negative for color change, pallor, rash and wound.  Allergic/Immunologic: Negative for environmental allergies, food allergies and immunocompromised state.  Neurological: Negative for dizziness, tremors, seizures, syncope, facial asymmetry, speech difficulty, weakness, light-headedness, numbness and headaches.  Hematological: Negative for adenopathy. Does not bruise/bleed easily.  Psychiatric/Behavioral: Negative for suicidal ideas, hallucinations, behavioral problems, confusion, sleep disturbance, self-injury, dysphoric mood, decreased concentration and agitation. The patient is not nervous/anxious and is not hyperactive.     Past Medical History  Diagnosis Date  . Glaucoma   . Hypertension   . Cataract   . Arthritis     B knees.   Past Surgical History  Procedure Laterality Date  . Cataract extraction Right 07-2014  . Colonoscopy      10 +yrs ago in [place], normal per pt  . Vaginal delivery      x2   No Known Allergies Current Outpatient Prescriptions  Medication Sig Dispense Refill  . bimatoprost (LUMIGAN) 0.01 % SOLN 1 drop at bedtime.    Marland Kitchen acetaminophen (TYLENOL) 650 MG CR tablet Take 650 mg by mouth every 8 (eight) hours as needed for pain.    . valsartan-hydrochlorothiazide (DIOVAN-HCT) 320-25 MG per tablet TAKE 1 TABLET BY MOUTH DAILY. 90 tablet 0   No current facility-administered medications for this visit.   History   Social History  . Marital Status: Single    Spouse Name: N/A  . Number of Children: N/A  . Years of Education: N/A   Occupational History  . retired    Social History Main Topics  . Smoking status: Never Smoker   . Smokeless tobacco: Never Used  . Alcohol Use: 3.6 oz/week    6 Standard drinks or equivalent per week     Comment: 2-3  . Drug Use: No  . Sexual Activity: Not  Currently   Other Topics Concern  . Not on file   Social History Narrative   Marital  status: widowed since 2010 after 67 years of marriage.      Children:  2 Children; no grandchildren; 2 adopted grandchildren.      Living: alone with 2 cats.      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  2 cups per week.      Drugs: none      Exercise: water aerobics three days per week.  Teaching a class.        Seatbelt: 100%      Guns: none      ADLs:: indepdent with ADLs.        Advanced Directives: yes; healthcare POA:  Older brother [name]);  FULL CODE but no prolonged measures.     Family History  Problem Relation Age of Onset  . Diabetes Mother   . Heart disease Mother   . Stroke Mother   . COPD Father   . Diabetes Sister   . Hypertension Sister   . Diabetes Brother   . Diabetes Brother   . Colon cancer Neg Hx   . Rectal cancer Neg Hx   . Stomach cancer Neg Hx        Objective:    BP 156/78 mmHg  Pulse 91  Temp(Src) 98.1 F (36.7 C) (Oral)  Resp 16  Ht 5\' 4"  (1.626 m)  Wt 242 lb (109.77 kg)  BMI 41.52 kg/m2  SpO2 97% Physical Exam  Constitutional: She is oriented to person, place, and time. She appears well-developed and well-nourished. No distress.  HENT:  Head: Normocephalic and atraumatic.  Right Ear: External ear normal.  Left Ear: External ear normal.  Nose: Nose normal.  Mouth/Throat: Oropharynx is clear and moist.  Eyes: Conjunctivae and EOM are normal. Pupils are equal, round, and reactive to light.  Neck: Normal range of motion and full passive range of motion without pain. Neck supple. No JVD present. Carotid bruit is not present. No thyromegaly present.  Cardiovascular: Normal rate, regular rhythm and normal heart sounds.  Exam reveals no gallop and no friction rub.   No murmur heard. Pulmonary/Chest: Effort normal and breath sounds normal. She has no wheezes. She has no rales. Right breast exhibits no inverted nipple, no mass, no nipple  discharge, no skin change and no tenderness. Left breast exhibits no inverted nipple, no mass, no nipple discharge, no skin change and no tenderness. Breasts are symmetrical.  Abdominal: Soft. Bowel sounds are normal. She exhibits no distension and no mass. There is no tenderness. There is no rebound and no guarding.  Genitourinary: Vagina normal and uterus normal. There is no rash, tenderness or lesion on the right labia. There is no rash, tenderness or lesion on the left labia. Cervix exhibits no motion tenderness. Right adnexum displays no mass, no tenderness and no fullness. Left adnexum displays no mass, no tenderness and no fullness.  Musculoskeletal:       Right shoulder: Normal.       Left shoulder: Normal.       Cervical back: Normal.  Lymphadenopathy:    She has no cervical adenopathy.  Neurological: She is alert and oriented to person, place, and time. She has normal reflexes. No cranial nerve deficit. She exhibits normal muscle tone. Coordination normal.  Skin: Skin is warm  and dry. No rash noted. She is not diaphoretic. No erythema. No pallor.  Psychiatric: She has a normal mood and affect. Her behavior is normal. Judgment and thought content normal.  Nursing note and vitals reviewed.  INFLUENZA VACCINE AND PREVNAR-13 ADMINISTERED.     Assessment & Plan:   1. Need for prophylactic vaccination and inoculation against influenza   2. Medicare annual wellness visit, subsequent   3. Colon cancer screening   4. Essential hypertension, benign   5. Screening, lipid   6. Breast cancer screening   7. Screening for breast cancer   8. Estrogen deficiency   9. Need for prophylactic vaccination against Streptococcus pneumoniae (pneumococcus)     1. Annual Wellness Examination Medicare Subsequent: anticipatory guidance --- exercise, weight loss, ASA 81mg  daily.  No longer warrants pap smears due to age.  Refer for mammogram.  Refer for bone density scan.  Refer for colonoscopy.   Immunizations reviewed; s/p Prevnar-13 and influenza vaccines.  Independent with ADLs.  Low fall risk.  No evidence of depression. No hearing loss.  Has advanced directives.   2.  Gynecological exam: completed; no longer warrants pap smears; refer for mammogram.  Refer for bone density scan. 3.  Colon cancer screening: Refer to GI for colonoscopy. 4.  HTN: moderately controlled; obtain labs, u/a.  Rx for Diovan-HCTZ provided. RTC six months. 5.  Screening lipid: obtain FLP. 5.  Breast cancer screening; obtain mammogram. 7.  Estrogen deficiency/screening osteoporosis: obtain bone density scan; recommend 3 servings of dairy daily. 8. S/p Prevnar 13 and influenza vaccines.   No orders of the defined types were placed in this encounter.    Return in about 6 months (around 01/24/2015) for recheck high blood pressure.    Reginia Forts, M.D.  [facility] [address] [place], [place]  [id number] [phone] phone [phone] fax

## 2014-07-25 NOTE — Patient Instructions (Signed)

## 2014-07-26 ENCOUNTER — Encounter: Payer: Self-pay | Admitting: Gastroenterology

## 2014-07-30 ENCOUNTER — Encounter: Payer: Self-pay | Admitting: Family Medicine

## 2014-07-31 DIAGNOSIS — H25811 Combined forms of age-related cataract, right eye: Secondary | ICD-10-CM | POA: Diagnosis not present

## 2014-07-31 DIAGNOSIS — H2511 Age-related nuclear cataract, right eye: Secondary | ICD-10-CM | POA: Diagnosis not present

## 2014-07-31 DIAGNOSIS — H25011 Cortical age-related cataract, right eye: Secondary | ICD-10-CM | POA: Diagnosis not present

## 2014-08-20 ENCOUNTER — Ambulatory Visit
Admission: RE | Admit: 2014-08-20 | Discharge: 2014-08-20 | Disposition: A | Discharge status: Home / Self Care | Payer: Medicare Other | Source: Ambulatory Visit | Attending: Family Medicine | Admitting: Family Medicine

## 2014-08-20 DIAGNOSIS — Z780 Asymptomatic menopausal state: Secondary | ICD-10-CM | POA: Diagnosis not present

## 2014-08-20 DIAGNOSIS — Z13820 Encounter for screening for osteoporosis: Secondary | ICD-10-CM | POA: Diagnosis not present

## 2014-08-20 DIAGNOSIS — Z1231 Encounter for screening mammogram for malignant neoplasm of breast: Secondary | ICD-10-CM | POA: Diagnosis not present

## 2014-08-20 DIAGNOSIS — Z1239 Encounter for other screening for malignant neoplasm of breast: Secondary | ICD-10-CM

## 2014-08-20 DIAGNOSIS — E2839 Other primary ovarian failure: Secondary | ICD-10-CM

## 2014-08-22 ENCOUNTER — Other Ambulatory Visit: Payer: Self-pay | Admitting: Family Medicine

## 2014-08-22 DIAGNOSIS — R928 Other abnormal and inconclusive findings on diagnostic imaging of breast: Secondary | ICD-10-CM

## 2014-09-04 ENCOUNTER — Ambulatory Visit
Admission: RE | Admit: 2014-09-04 | Discharge: 2014-09-04 | Disposition: A | Discharge status: Home / Self Care | Payer: Medicare Other | Source: Ambulatory Visit | Attending: Family Medicine | Admitting: Family Medicine

## 2014-09-04 DIAGNOSIS — R928 Other abnormal and inconclusive findings on diagnostic imaging of breast: Secondary | ICD-10-CM

## 2014-09-04 DIAGNOSIS — N6012 Diffuse cystic mastopathy of left breast: Secondary | ICD-10-CM | POA: Diagnosis not present

## 2014-09-04 DIAGNOSIS — N6011 Diffuse cystic mastopathy of right breast: Secondary | ICD-10-CM | POA: Diagnosis not present

## 2014-09-13 ENCOUNTER — Other Ambulatory Visit: Payer: Self-pay | Admitting: Family Medicine

## 2014-09-14 ENCOUNTER — Encounter: Payer: Self-pay | Admitting: Family Medicine

## 2014-09-26 ENCOUNTER — Ambulatory Visit (AMBULATORY_SURGERY_CENTER): Payer: Self-pay | Admitting: *Deleted

## 2014-09-26 VITALS — Ht 65.0 in | Wt 241.8 lb

## 2014-09-26 DIAGNOSIS — Z1211 Encounter for screening for malignant neoplasm of colon: Secondary | ICD-10-CM

## 2014-09-26 MED ORDER — MOVIPREP 100 G PO SOLR: 1.0000 | Freq: Once | ORAL | Status: DC

## 2014-09-26 NOTE — Progress Notes (Signed)
No egg or soy allergy No diet pills No home 02 use No issues with past sedation Pt declined emmi video

## 2014-10-16 ENCOUNTER — Ambulatory Visit (AMBULATORY_SURGERY_CENTER): Payer: Medicare Other | Admitting: Gastroenterology

## 2014-10-16 ENCOUNTER — Encounter: Payer: Self-pay | Admitting: Gastroenterology

## 2014-10-16 VITALS — BP 125/77 | HR 79 | Temp 96.4°F | Resp 16 | Ht 65.0 in | Wt 241.0 lb

## 2014-10-16 DIAGNOSIS — D122 Benign neoplasm of ascending colon: Secondary | ICD-10-CM

## 2014-10-16 DIAGNOSIS — D125 Benign neoplasm of sigmoid colon: Secondary | ICD-10-CM | POA: Diagnosis not present

## 2014-10-16 DIAGNOSIS — D123 Benign neoplasm of transverse colon: Secondary | ICD-10-CM | POA: Diagnosis not present

## 2014-10-16 DIAGNOSIS — Z1211 Encounter for screening for malignant neoplasm of colon: Secondary | ICD-10-CM | POA: Diagnosis not present

## 2014-10-16 DIAGNOSIS — I10 Essential (primary) hypertension: Secondary | ICD-10-CM | POA: Diagnosis not present

## 2014-10-16 MED ORDER — SODIUM CHLORIDE 0.9 % IV SOLN: 500.0000 mL | INTRAVENOUS | Status: DC

## 2014-10-16 NOTE — Patient Instructions (Addendum)
YOU HAD AN ENDOSCOPIC PROCEDURE TODAY AT [facility]:   Refer to the procedure report that was given to you for any specific questions about what was found during the examination.  If the procedure report does not answer your questions, please call your gastroenterologist to clarify.  If you requested that your care partner not be given the details of your procedure findings, then the procedure report has been included in a sealed envelope for you to review at your convenience later.  YOU SHOULD EXPECT: Some feelings of bloating in the abdomen. Passage of more gas than usual.  Walking can help get rid of the air that was put into your GI tract during the procedure and reduce the bloating. If you had a lower endoscopy (such as a colonoscopy or flexible sigmoidoscopy) you may notice spotting of blood in your stool or on the toilet paper. If you underwent a bowel prep for your procedure, you may not have a normal bowel movement for a few days.  Please Note:  You might notice some irritation and congestion in your nose or some drainage.  This is from the oxygen used during your procedure.  There is no need for concern and it should clear up in a day or so.  SYMPTOMS TO REPORT IMMEDIATELY:   Following lower endoscopy (colonoscopy or flexible sigmoidoscopy):  Excessive amounts of blood in the stool  Significant tenderness or worsening of abdominal pains  Swelling of the abdomen that is new, acute  Fever of 100F or higher   A gastroenterologist can be reached at any hour by calling [phone].   DIET: Your first meal following the procedure should be a small meal and then it is ok to progress to your normal diet. Heavy or fried foods are harder to digest and may make you feel nauseous or bloated.  Likewise, meals heavy in dairy and vegetables can increase bloating.  Drink plenty of fluids but you should avoid alcoholic beverages for 24 hours. Try to increase the fiber in your  diet.  ACTIVITY:  You should plan to take it easy for the rest of today and you should NOT DRIVE or use heavy machinery until tomorrow (because of the sedation medicines used during the test).    FOLLOW UP: Our staff will call the number listed on your records the next business day following your procedure to check on you and address any questions or concerns that you may have regarding the information given to you following your procedure. If we do not reach you, we will leave a message.  However, if you are feeling well and you are not experiencing any problems, there is no need to return our call.  We will assume that you have returned to your regular daily activities without incident.  If any biopsies were taken you will be contacted by phone or by letter within the next 1-3 weeks.  Please call us at [phone] if you have not heard about the biopsies in 3 weeks.    SIGNATURES/CONFIDENTIALITY: You and/or your care partner have signed paperwork which will be entered into your electronic medical record.  These signatures attest to the fact that that the information above on your After Visit Summary has been reviewed and is understood.  Full responsibility of the confidentiality of this discharge information lies with you and/or your care-partner.  Read all of the handouts given to you by your recovery room nurse.

## 2014-10-16 NOTE — Progress Notes (Signed)
OK to discharge in 20 minutes per Dr. Fuller Plan.

## 2014-10-16 NOTE — Progress Notes (Signed)
Called to room to assist during endoscopic procedure.  Patient ID and intended procedure confirmed with present staff. Received instructions for my participation in the procedure from the performing physician.  

## 2014-10-16 NOTE — Op Note (Addendum)
[facility]  [address] [place], [id number]   COLONOSCOPY PROCEDURE REPORT  PATIENT: Tammy Mccullough, Tammy Mccullough  MR#: [id number] BIRTHDATE: 01/24/1941 , 62  yrs. old GENDER: female ENDOSCOPIST: Ladene Artist, MD, [facility] REFERRED [id number] Tamala Julian, M.D. PROCEDURE DATE:  10/16/2014 PROCEDURE:   Colonoscopy with snare polypectomy First Screening Colonoscopy - Avg.  risk and is 50 yrs.  old or older Yes.  Prior Negative Screening - Now for repeat screening. N/A  History of Adenoma - Now for follow-up colonoscopy & has been > or = to 3 yrs.  N/A  Polyps Removed Today? Yes. ASA CLASS:   Class II INDICATIONS:average risk patient for colorectal cancer. MEDICATIONS: Monitored anesthesia care and Propofol 260 mg IV DESCRIPTION OF PROCEDURE:   After the risks benefits and alternatives of the procedure were thoroughly explained, informed consent was obtained.  The digital rectal exam revealed no abnormalities of the rectum.   The [id number]  endoscope was introduced through the anus and advanced to the cecum, which was identified by both the appendix and ileocecal valve. No adverse events experienced.   The quality of the prep was excellent, using MoviPrep  The instrument was then slowly withdrawn as the colon was fully examined.    COLON FINDINGS: Five sessile polyps measuring 5-6 mm in size were found in the sigmoid colon, transverse colon, and ascending colon. Polypectomies were performed with a cold snare.  The resection was complete, the polyp tissue was completely retrieved and sent to histology.   There was diverticulosis noted in the descending colon and sigmoid colon.   The examination was otherwise normal. Retroflexion was not performed due to a narrow rectal vault and internal Grade I hemorrhoids. The time to cecum=3 minutes 13 seconds.  Withdrawal time=9 minutes 34 seconds.  The scope was withdrawn and the procedure completed. COMPLICATIONS: There were no  immediate complications.  ENDOSCOPIC IMPRESSION: 1.   Five sessile polyps in the sigmoid, transverse and ascending colon; polypectomies performed with a cold snare 2.   Diverticulosis in the descending colon and sigmoid colon 3.   Grade l internal hemorrhoids  RECOMMENDATIONS: 1.  Await pathology results 2.  High fiber diet with liberal fluid intake. 3.  Repeat colonoscopy in 5 years if polyp(s) adenomatous; otherwise given your age, you will not need another colonoscopy for colon cancer screening or polyp surveillance.  These types of tests usually stop around the age 16.  eSigned:  Ladene Artist, MD, [facility] 10/16/2014 10:16 AM Revised: 10/16/2014 10:16 AM

## 2014-10-16 NOTE — Progress Notes (Signed)
Procedure ends, to recovery, report given to Nyra Capes, RN. VSS.

## 2014-10-17 ENCOUNTER — Telehealth: Payer: Self-pay | Admitting: *Deleted

## 2014-10-17 NOTE — Telephone Encounter (Signed)
  Follow up Call-  Call back number 10/16/2014  Post procedure Call Back phone  # [phone]  Permission to leave phone message No     Patient questions:  Do you have a fever, pain , or abdominal swelling? No. Pain Score  0 *  Have you tolerated food without any problems? Yes.    Have you been able to return to your normal activities? Yes.    Do you have any questions about your discharge instructions: Diet   No. Medications  No. Follow up visit  No.  Do you have questions or concerns about your Care? No.  Actions: * If pain score is 4 or above: No action needed, pain <4.

## 2014-10-23 ENCOUNTER — Encounter: Payer: Self-pay | Admitting: Gastroenterology

## 2014-12-25 ENCOUNTER — Ambulatory Visit (INDEPENDENT_AMBULATORY_CARE_PROVIDER_SITE_OTHER): Payer: Medicare Other | Admitting: Physician Assistant

## 2014-12-25 VITALS — BP 128/70 | HR 78 | Temp 97.8°F | Resp 18 | Ht 64.0 in | Wt 243.4 lb

## 2014-12-25 DIAGNOSIS — Z91048 Other nonmedicinal substance allergy status: Secondary | ICD-10-CM

## 2014-12-25 DIAGNOSIS — Z9109 Other allergy status, other than to drugs and biological substances: Secondary | ICD-10-CM

## 2014-12-25 DIAGNOSIS — I10 Essential (primary) hypertension: Secondary | ICD-10-CM

## 2014-12-25 DIAGNOSIS — J309 Allergic rhinitis, unspecified: Secondary | ICD-10-CM

## 2014-12-25 MED ORDER — IPRATROPIUM BROMIDE 0.03 % NA SOLN: 2.0000 | Freq: Two times a day (BID) | NASAL | Status: DC

## 2014-12-25 MED ORDER — GUAIFENESIN ER 1200 MG PO TB12: 1.0000 | ORAL_TABLET | Freq: Two times a day (BID) | ORAL | Status: DC | PRN

## 2014-12-25 MED ORDER — CETIRIZINE HCL 10 MG PO TABS: 10.0000 mg | ORAL_TABLET | Freq: Every day | ORAL | Status: DC

## 2014-12-25 MED ORDER — VALSARTAN-HYDROCHLOROTHIAZIDE 320-25 MG PO TABS: ORAL_TABLET | ORAL | Status: DC

## 2014-12-25 NOTE — Patient Instructions (Signed)
Please continue to increase your water intake to over 64 oz per day (~4 regular sized water bottles) Allergic Rhinitis Allergic rhinitis is when the mucous membranes in the nose respond to allergens. Allergens are particles in the air that cause your body to have an allergic reaction. This causes you to release allergic antibodies. Through a chain of events, these eventually cause you to release histamine into the blood stream. Although meant to protect the body, it is this release of histamine that causes your discomfort, such as frequent sneezing, congestion, and an itchy, runny nose.  CAUSES  Seasonal allergic rhinitis (hay fever) is caused by pollen allergens that may come from grasses, trees, and weeds. Year-round allergic rhinitis (perennial allergic rhinitis) is caused by allergens such as house dust mites, pet dander, and mold spores.  SYMPTOMS   Nasal stuffiness (congestion).  Itchy, runny nose with sneezing and tearing of the eyes. DIAGNOSIS  Your health care provider can help you determine the allergen or allergens that trigger your symptoms. If you and your health care provider are unable to determine the allergen, skin or blood testing may be used. TREATMENT  Allergic rhinitis does not have a cure, but it can be controlled by:  Medicines and allergy shots (immunotherapy).  Avoiding the allergen. Hay fever may often be treated with antihistamines in pill or nasal spray forms. Antihistamines block the effects of histamine. There are over-the-counter medicines that may help with nasal congestion and swelling around the eyes. Check with your health care provider before taking or giving this medicine.  If avoiding the allergen or the medicine prescribed do not work, there are many new medicines your health care provider can prescribe. Stronger medicine may be used if initial measures are ineffective. Desensitizing injections can be used if medicine and avoidance does not work.  Desensitization is when a patient is given ongoing shots until the body becomes less sensitive to the allergen. Make sure you follow up with your health care provider if problems continue. HOME CARE INSTRUCTIONS It is not possible to completely avoid allergens, but you can reduce your symptoms by taking steps to limit your exposure to them. It helps to know exactly what you are allergic to so that you can avoid your specific triggers. SEEK MEDICAL CARE IF:   You have a fever.  You develop a cough that does not stop easily (persistent).  You have shortness of breath.  You start wheezing.  Symptoms interfere with normal daily activities. Document Released: 04/28/2001 Document Revised: 08/08/2013 Document Reviewed: 04/10/2013 [facility] Patient Information 2015 [place], [place]. This information is not intended to replace advice given to you by your health care provider. Make sure you discuss any questions you have with your health care provider.

## 2014-12-25 NOTE — Progress Notes (Signed)
[facility] [address], [place] [address] [phone]  Date:  12/25/2014   Name:  Tammy Mccullough   DOB:  [birth year]   MRN:  [id number]  PCP:  Reginia Forts, MD    Chief Complaint: sneezing; Nasal Congestion; Ear Problem; and Medication Refill   History of Present Illness:  Tammy Mccullough is a 74 y.o. very pleasant female patient who presents with the following:  Patient reports sneezing and nasal congestion that occurs intermittently for the last three weeks.  It generally occurs in the evening.  Yesterday, she felt like her right ear was clogged.  Today, it is on the left.  She describes these symptoms as aggravating, moreso than painful.  She has no cough, watery eyes, or dyspnea.  There is no fever or chills.  She does not think the air is dry in her home.  She has no known allergy history.  She has no GI symptoms of abdominal pain, nausea, or vomiting.    She would also like a refill of her anti-hypertensive.  She has some pills left.  She tolerates the medication well, and reports no adverse side effects.  She does not monitor her blood pressure, but denies chest pains, palpitations, leg swelling, or sob.    Patient Active Problem List   Diagnosis Date Noted  . Glaucoma 02/09/2013  . HTN (hypertension) 02/09/2013    Past Medical History  Diagnosis Date  . Glaucoma   . Hypertension   . Cataract   . Arthritis     B knees.    Past Surgical History  Procedure Laterality Date  . Cataract extraction Right 07-2014  . Colonoscopy      10 +yrs ago in [place], normal per pt  . Vaginal delivery      x2    History  Substance Use Topics  . Smoking status: Never Smoker   . Smokeless tobacco: Never Used  . Alcohol Use: 3.6 oz/week    6 Standard drinks or equivalent per week     Comment: 2-3    Family History  Problem Relation Age of Onset  . Diabetes Mother   . Heart disease Mother   . Stroke Mother   . COPD Father   . Diabetes Sister   .  Hypertension Sister   . Diabetes Brother   . Diabetes Brother   . Colon cancer Neg Hx   . Rectal cancer Neg Hx   . Stomach cancer Neg Hx     No Known Allergies  Medication list has been reviewed and updated.  Current Outpatient Prescriptions on File Prior to Visit  Medication Sig Dispense Refill  . acetaminophen (TYLENOL) 650 MG CR tablet Take 650 mg by mouth every 8 (eight) hours as needed for pain.    . bimatoprost (LUMIGAN) 0.01 % SOLN 1 drop at bedtime.    . valsartan-hydrochlorothiazide (DIOVAN-HCT) 320-25 MG per tablet TAKE 1 TABLET BY MOUTH DAILY. 90 tablet 0   No current facility-administered medications on file prior to visit.    Review of Systems: ROS otherwise unremarkable unless listed above.    Physical Examination: Filed Vitals:   12/25/14 0834  BP: 128/70  Pulse: 78  Temp: 97.8 F (36.6 C)  Resp: 18   Filed Vitals:   12/25/14 0834  Height: 5\' 4"  (1.626 m)  Weight: 243 lb 6.4 oz (110.406 kg)   Body mass index is 41.76 kg/(m^2). Ideal Body Weight: Weight in (lb) to have BMI = 25:  145.3  Cooperative, alert and oriented x4 in NAD.   No auricular, tonsillar, submandibular, clavicular, or cervical adenopathy. No injected conjunctiva.  Nasal turbinates are mildly edematous and pale.  Normal oral mucosa without erythema or tonsillar edema.  Ears without injection, erythema, bulging, or perforation.  The left eye had some cerumen preventing visualization of tympanic membrane, but after irrigation, TM is normal as stated.   Lungs are circulating air well without decreased breath sounds, wheezing, or rhonchi. Heart has normal R and R, without murmurs, rubs, or gallops. Skin is warm and dry without rash Normal radial and DP pulses without extremity edema.     Assessment and Plan: 74 year old female is here today for chief complaint of nasal congestion, sneezing, and desires medication refill for the diovan-hct.  Environmental allergies -  Plan: Guaifenesin  (MUCINEX MAXIMUM STRENGTH) 1200 MG TB12,  Called in cetirizine 10mg  to use for 2 weeks.    Allergic rhinitis, unspecified allergic rhinitis type -  Plan: ipratropium (ATROVENT) 0.03 % nasal spray,  Guaifenesin (MUCINEX MAXIMUM STRENGTH) 1200 MG TB12  Essential hypertension -  Plan: valsartan-hydrochlorothiazide (DIOVAN-HCT) 320-25 MG per tablet Stable, will refill.  She has an appointment with PCP in about a month. Essential hypertension, benign  Tammy Drape, PA-C [facility] 5/10/20163:04 PM

## 2015-01-16 DIAGNOSIS — H401332 Pigmentary glaucoma, bilateral, moderate stage: Secondary | ICD-10-CM | POA: Diagnosis not present

## 2015-01-16 DIAGNOSIS — H25812 Combined forms of age-related cataract, left eye: Secondary | ICD-10-CM | POA: Diagnosis not present

## 2015-01-30 ENCOUNTER — Ambulatory Visit: Payer: Medicare Other | Admitting: Family Medicine

## 2015-02-01 DIAGNOSIS — H401332 Pigmentary glaucoma, bilateral, moderate stage: Secondary | ICD-10-CM | POA: Diagnosis not present

## 2015-02-11 ENCOUNTER — Ambulatory Visit (INDEPENDENT_AMBULATORY_CARE_PROVIDER_SITE_OTHER): Payer: Medicare Other | Admitting: Podiatry

## 2015-02-11 ENCOUNTER — Ambulatory Visit (INDEPENDENT_AMBULATORY_CARE_PROVIDER_SITE_OTHER): Payer: Medicare Other

## 2015-02-11 ENCOUNTER — Encounter: Payer: Self-pay | Admitting: Podiatry

## 2015-02-11 VITALS — BP 142/76 | HR 88 | Temp 98.3°F | Resp 16

## 2015-02-11 DIAGNOSIS — L601 Onycholysis: Secondary | ICD-10-CM

## 2015-02-11 DIAGNOSIS — S90222A Contusion of left lesser toe(s) with damage to nail, initial encounter: Secondary | ICD-10-CM

## 2015-02-11 DIAGNOSIS — R52 Pain, unspecified: Secondary | ICD-10-CM

## 2015-02-11 MED ORDER — CEPHALEXIN 500 MG PO CAPS: 500.0000 mg | ORAL_CAPSULE | Freq: Three times a day (TID) | ORAL | Status: DC

## 2015-02-11 NOTE — Patient Instructions (Signed)

## 2015-02-12 NOTE — Progress Notes (Signed)
Subjective:     Patient ID: RAELIN PIXLER, female   DOB: 04/02/1941, 74 y.o.   MRN: [id number]  HPI 74 year old female presents the office with concerns of left big toenail pain. She states that last week she was mopping the floor she fell injuring her toenail. She states that since then she since noticed that her toenails with the lift up and there is been dark underneath the toenail. She states the nail is painful especially with pressure and shoe gear, causing a throbbing sensation. She denies any surrounding redness or any drainage others been some slight edema directly around the nail borders. She denies any other areas of pain to bilateral lower extremity or other injuries at the time of the accident.  Review of Systems  All other systems reviewed and are negative.      Objective:   Physical Exam AAO x3, NAD DP/PT pulses palpable bilaterally, CRT less than 3 seconds Protective sensation intact with Simms Weinstein monofilament, vibratory sensation intact, Achilles tendon reflex intact His left hallux toenail is tenderness palpation overlying the nail. The nail does appear to be loose and underlying nailbed distally. There is evidence of subungual hematoma, since approximating 80% of the toenail. There is localized edema directly around the nail borders without any associated erythema, ascending cellulitis, drainage/purulence, malodor. No areas of tenderness to bilateral lower extremities. MMT 5/5, ROM WNL.  No open lesions or pre-ulcerative lesions.  No overlying edema, erythema, increase in warmth to bilateral lower extremities.  No pain with calf compression, swelling, warmth, erythema bilaterally.      Assessment:     74 year old female with onycholysis and subungual hematoma secondary to injury    Plan:     -X-rays were obtained and reviewed with the patient.  -Treatment options discussed including all alternatives, risks, and complications -At this time, discussed total  nail removal without chemical matricectomy vs. Debridement of the nail to the left hallux. At this time she elects to proceed with the total nail avulsion.  Risks and complications were discussed with the patient for which they understand and  verbally consent to the procedure. Under sterile conditions a total of 3 mL of a mixture of 2% lidocaine plain and 0.5% Marcaine plain was infiltrated in a hallux block fashion. Once anesthetized, the skin was prepped in sterile fashion. A tourniquet was then applied. Next the left hallux nail was excised making sure to remove the entire offending nail borders. Thick, serosangenous drainage was expressed. No frank purulence was identified. Once the nail was removed, the area was debrided and the underlying skin was intact. The area was irrigated and hemostasis was obtained.  A dry sterile dressing was applied. After application of the dressing the tourniquet was removed and there is found to be an immediate capillary refill time to the digit. The patient tolerated the procedure well any complications. Post procedure instructions were discussed the patient for which he verbally understood. Follow-up in one week for nail check or sooner if any problems are to arise. Discussed signs/symptoms of worsening infection and directed to call the office immediately should any occur or go directly to the emergency room. In the meantime, encouraged to call the office with any questions, concerns, changes symptoms. -Rx Keflex  Celesta Gentile, DPM

## 2015-02-21 ENCOUNTER — Ambulatory Visit (INDEPENDENT_AMBULATORY_CARE_PROVIDER_SITE_OTHER): Payer: Medicare Other | Admitting: Podiatry

## 2015-02-21 DIAGNOSIS — S90222A Contusion of left lesser toe(s) with damage to nail, initial encounter: Secondary | ICD-10-CM

## 2015-02-21 DIAGNOSIS — Z9889 Other specified postprocedural states: Secondary | ICD-10-CM

## 2015-02-21 NOTE — Progress Notes (Signed)
She presents today for matrixectomy tibiofibular border of the hallux left. She states this seems to be doing well but I'm not sure what is suppose to look like she says. She continues to soak in Epsom salts and warm water and she is currently at plying Neosporin ointment as well as drops.  Objective: Vital signs are stable she is alert and oriented 3. Margins appear to be healing uneventfully. There is no erythema edema cellulitis drainage or odor.  Assessment: Well-healed surgical matrixectomy hallux left.  Plan: I encouraged her to increase the concentration of the salt's and continue to soak until completely resolved. She she develop any redness or swelling. Once or malodor she is to notify us immediately.

## 2015-02-26 DIAGNOSIS — H25812 Combined forms of age-related cataract, left eye: Secondary | ICD-10-CM | POA: Diagnosis not present

## 2015-02-26 DIAGNOSIS — H4011X2 Primary open-angle glaucoma, moderate stage: Secondary | ICD-10-CM | POA: Diagnosis not present

## 2015-03-08 ENCOUNTER — Ambulatory Visit (INDEPENDENT_AMBULATORY_CARE_PROVIDER_SITE_OTHER): Payer: Medicare Other | Admitting: Family Medicine

## 2015-03-08 ENCOUNTER — Encounter: Payer: Self-pay | Admitting: Family Medicine

## 2015-03-08 VITALS — BP 130/90 | HR 83 | Temp 98.7°F | Resp 16 | Ht 64.0 in | Wt 244.4 lb

## 2015-03-08 DIAGNOSIS — I10 Essential (primary) hypertension: Secondary | ICD-10-CM | POA: Diagnosis not present

## 2015-03-08 DIAGNOSIS — R7309 Other abnormal glucose: Secondary | ICD-10-CM | POA: Diagnosis not present

## 2015-03-08 DIAGNOSIS — R739 Hyperglycemia, unspecified: Secondary | ICD-10-CM | POA: Diagnosis not present

## 2015-03-08 DIAGNOSIS — H409 Unspecified glaucoma: Secondary | ICD-10-CM | POA: Diagnosis not present

## 2015-03-08 DIAGNOSIS — H6123 Impacted cerumen, bilateral: Secondary | ICD-10-CM

## 2015-03-08 DIAGNOSIS — E669 Obesity, unspecified: Secondary | ICD-10-CM

## 2015-03-08 LAB — CBC WITH DIFFERENTIAL/PLATELET
BASOS PCT: 1 % (ref 0–1)
Basophils Absolute: 0 10*3/uL (ref 0.0–0.1)
Eosinophils Absolute: 0.2 10*3/uL (ref 0.0–0.7)
Eosinophils Relative: 5 % (ref 0–5)
HCT: 38.9 % (ref 36.0–46.0)
Hemoglobin: 13 g/dL (ref 12.0–15.0)
Lymphocytes Relative: 46 % (ref 12–46)
Lymphs Abs: 1.7 10*3/uL (ref 0.7–4.0)
MCH: 31 pg (ref 26.0–34.0)
MCHC: 33.4 g/dL (ref 30.0–36.0)
MCV: 92.8 fL (ref 78.0–100.0)
MPV: 12.2 fL (ref 8.6–12.4)
Monocytes Absolute: 0.1 10*3/uL (ref 0.1–1.0)
Monocytes Relative: 3 % (ref 3–12)
NEUTROS PCT: 45 % (ref 43–77)
Neutro Abs: 1.7 10*3/uL (ref 1.7–7.7)
Platelets: 207 10*3/uL (ref 150–400)
RBC: 4.19 MIL/uL (ref 3.87–5.11)
RDW: 13.5 % (ref 11.5–15.5)
WBC: 3.8 10*3/uL — ABNORMAL LOW (ref 4.0–10.5)

## 2015-03-08 LAB — COMPREHENSIVE METABOLIC PANEL
ALK PHOS: 54 U/L (ref 39–117)
ALT: 12 U/L (ref 0–35)
AST: 20 U/L (ref 0–37)
Albumin: 3.9 g/dL (ref 3.5–5.2)
BILIRUBIN TOTAL: 1 mg/dL (ref 0.2–1.2)
BUN: 23 mg/dL (ref 6–23)
CHLORIDE: 101 meq/L (ref 96–112)
CO2: 27 meq/L (ref 19–32)
Calcium: 9.3 mg/dL (ref 8.4–10.5)
Creat: 1.11 mg/dL — ABNORMAL HIGH (ref 0.50–1.10)
GLUCOSE: 115 mg/dL — AB (ref 70–99)
Potassium: 3.6 mEq/L (ref 3.5–5.3)
Sodium: 139 mEq/L (ref 135–145)
Total Protein: 7.1 g/dL (ref 6.0–8.3)

## 2015-03-08 LAB — HEMOGLOBIN A1C
Hgb A1c MFr Bld: 5.5 % (ref <5.7)
Mean Plasma Glucose: 111 mg/dL (ref <117)

## 2015-03-08 MED ORDER — VALSARTAN-HYDROCHLOROTHIAZIDE 320-25 MG PO TABS: ORAL_TABLET | ORAL | Status: DC

## 2015-03-08 NOTE — Patient Instructions (Signed)

## 2015-03-08 NOTE — Progress Notes (Signed)
Subjective:    Patient ID: Tammy Mccullough, female    DOB: June 30, 1941, 74 y.o.   MRN: [id number]  03/08/2015  Follow-up; Hypertension; and Medication Refill   HPI This 74 y.o. female presents for six month follow-up for the following:  1.  HTN:  Patient reports good compliance with medication, good tolerance to medication, and good symptom control.  Exercising some; could not exercise due to toe surgery; water aerobics three days per week.  2. Hyperglycemia: glucose elevated at CPE six months ago.  No weight loss; not exercising.  Family history of diabetes.  3.  Health maintenance:  Mammogram 2016 with breast cysts. Bone density scan normal in 2016. S/p Prevnar 13 and influenza vaccines. S/p colonoscopy in 10/2014.  4. Glaucoma:  Followed every six months; R eye pressure was not improving; seeing a glaucoma specialist now; went last week; returns in one month; s/p laser procedure due to elevated pressure; Dr. Venetia Maxon; lasering in office.    5.  L first toenail subungal hematoma: mopping floor; slipped and toe hit refrigerator; s/p removal.   6. Obesity: trying to lose weight.  No success.  Will lose 2 pounds and then gain it back.     Review of Systems  Constitutional: Negative for fever, chills, diaphoresis and fatigue.  Eyes: Negative for visual disturbance.  Respiratory: Negative for cough and shortness of breath.   Cardiovascular: Negative for chest pain, palpitations and leg swelling.  Gastrointestinal: Negative for nausea, vomiting, abdominal pain, diarrhea and constipation.  Endocrine: Negative for cold intolerance, heat intolerance, polydipsia, polyphagia and polyuria.  Neurological: Negative for dizziness, tremors, seizures, syncope, facial asymmetry, speech difficulty, weakness, light-headedness, numbness and headaches.    Past Medical History  Diagnosis Date  . Glaucoma   . Hypertension   . Cataract   . Arthritis     B knees.   Past Surgical History    Procedure Laterality Date  . Cataract extraction Right 07-2014  . Colonoscopy      10 +yrs ago in [place], normal per pt  . Vaginal delivery      x2   No Known Allergies Current Outpatient Prescriptions  Medication Sig Dispense Refill  . bimatoprost (LUMIGAN) 0.01 % SOLN 1 drop at bedtime.    . Brimonidine Tartrate-Timolol (COMBIGAN OP) Apply to eye 2 (two) times daily.    . Brinzolamide (AZOPT OP) Apply to eye 3 (three) times daily.    . valsartan-hydrochlorothiazide (DIOVAN-HCT) 320-25 MG per tablet TAKE 1 TABLET BY MOUTH DAILY. 90 tablet 3   No current facility-administered medications for this visit.   History   Social History  . Marital Status: Single    Spouse Name: N/A  . Number of Children: N/A  . Years of Education: N/A   Occupational History  . retired    Social History Main Topics  . Smoking status: Never Smoker   . Smokeless tobacco: Never Used  . Alcohol Use: 3.6 oz/week    6 Standard drinks or equivalent per week     Comment: 2-3  . Drug Use: No  . Sexual Activity: Not Currently   Other Topics Concern  . Not on file   Social History Narrative   Marital status: widowed since 2010 after 50 years of marriage.      Children:  2 Children; no grandchildren; 2 adopted grandchildren.      Living: alone with 2 cats.      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  2 cups per week.      Drugs: none      Exercise: water aerobics three days per week.  Teaching a class.        Seatbelt: 100%      Guns: none      ADLs:: indepdent with ADLs.        Advanced Directives: yes; healthcare POA:  Older brother [name]);  FULL CODE but no prolonged measures.     Family History  Problem Relation Age of Onset  . Diabetes Mother   . Heart disease Mother   . Stroke Mother   . COPD Father   . Diabetes Sister   . Hypertension Sister   . Diabetes Brother   . Diabetes  Brother   . Colon cancer Neg Hx   . Rectal cancer Neg Hx   . Stomach cancer Neg Hx        Objective:    BP 130/90 mmHg  Pulse 83  Temp(Src) 98.7 F (37.1 C) (Oral)  Resp 16  Ht 5' 4"  (1.626 m)  Wt 244 lb 6.4 oz (110.859 kg)  BMI 41.93 kg/m2  SpO2 94% Physical Exam  Constitutional: She is oriented to person, place, and time. She appears well-developed and well-nourished. No distress.  obese  HENT:  Head: Normocephalic and atraumatic.  Right Ear: External ear normal.  Left Ear: External ear normal.  Nose: Nose normal.  Mouth/Throat: Oropharynx is clear and moist.  Cerumen impaction B.  Eyes: Conjunctivae and EOM are normal. Pupils are equal, round, and reactive to light.  Neck: Normal range of motion. Neck supple. Carotid bruit is not present. No thyromegaly present.  Cardiovascular: Normal rate, regular rhythm, normal heart sounds and intact distal pulses.  Exam reveals no gallop and no friction rub.   No murmur heard. 1+ non-pitting edema BLE.  Pulmonary/Chest: Effort normal and breath sounds normal. She has no wheezes. She has no rales.  Abdominal: Soft. Bowel sounds are normal. She exhibits no distension and no mass. There is no tenderness. There is no rebound and no guarding.  Lymphadenopathy:    She has no cervical adenopathy.  Neurological: She is alert and oriented to person, place, and time. No cranial nerve deficit.  Skin: Skin is warm and dry. No rash noted. She is not diaphoretic. No erythema. No pallor.  Psychiatric: She has a normal mood and affect. Her behavior is normal.   Results for orders placed or performed in visit on 03/08/15  CBC with Differential/Platelet  Result Value Ref Range   WBC 3.8 (L) 4.0 - 10.5 K/uL   RBC 4.19 3.87 - 5.11 MIL/uL   Hemoglobin 13.0 12.0 - 15.0 g/dL   HCT 38.9 36.0 - 46.0 %   MCV 92.8 78.0 - 100.0 fL   MCH 31.0 26.0 - 34.0 pg   MCHC 33.4 30.0 - 36.0 g/dL   RDW 13.5 11.5 - 15.5 %   Platelets 207 150 - 400 K/uL   MPV 12.2  8.6 - 12.4 fL   Neutrophils Relative % 45 43 - 77 %   Neutro Abs 1.7 1.7 - 7.7 K/uL   Lymphocytes Relative 46 12 - 46 %   Lymphs Abs 1.7 0.7 - 4.0 K/uL   Monocytes Relative 3 3 - 12 %   Monocytes Absolute 0.1 0.1 - 1.0 K/uL   Eosinophils Relative 5 0 - 5 %   Eosinophils Absolute 0.2  0.0 - 0.7 K/uL   Basophils Relative 1 0 - 1 %   Basophils Absolute 0.0 0.0 - 0.1 K/uL   Smear Review Criteria for review not met   Comprehensive metabolic panel  Result Value Ref Range   Sodium 139 135 - 145 mEq/L   Potassium 3.6 3.5 - 5.3 mEq/L   Chloride 101 96 - 112 mEq/L   CO2 27 19 - 32 mEq/L   Glucose, Bld 115 (H) 70 - 99 mg/dL   BUN 23 6 - 23 mg/dL   Creat 1.11 (H) 0.50 - 1.10 mg/dL   Total Bilirubin 1.0 0.2 - 1.2 mg/dL   Alkaline Phosphatase 54 39 - 117 U/L   AST 20 0 - 37 U/L   ALT 12 0 - 35 U/L   Total Protein 7.1 6.0 - 8.3 g/dL   Albumin 3.9 3.5 - 5.2 g/dL   Calcium 9.3 8.4 - 10.5 mg/dL  Hemoglobin A1c  Result Value Ref Range   Hgb A1c MFr Bld  <5.7 %   Mean Plasma Glucose  <117 mg/dL   B EARS IRRIGATED.    Assessment & Plan:   1. Essential hypertension, benign   2. Glaucoma   3. Hyperglycemia   4. Obesity   5. Severe obesity (BMI >= 40)   6. Cerumen impaction, bilateral   7. Essential hypertension     1. HTN: controlled; obtain labs; refill provided. 2.  Glaucoma: worsening; referred to glaucoma specialist and followed every month. 3. Hyperglycemia: New at CPE six months ago; repeat glucose today with HgbA1c. 4.  Severe obesity: persistent; highly recommend weight loss, exercise, low-caloric food choices. 5.  Cerumen impaction B: New.  S/p irrigation of B ears in office.   Meds ordered this encounter  Medications  . valsartan-hydrochlorothiazide (DIOVAN-HCT) 320-25 MG per tablet    Sig: TAKE 1 TABLET BY MOUTH DAILY.    Dispense:  90 tablet    Refill:  3    Return for complete physical examiniation.     Tobie Perdue Elayne Guerin, M.D. [facility] [address] [place], [place]  [address] [phone] phone [phone] fax

## 2015-04-12 DIAGNOSIS — H401332 Pigmentary glaucoma, bilateral, moderate stage: Secondary | ICD-10-CM | POA: Diagnosis not present

## 2015-04-12 DIAGNOSIS — H25812 Combined forms of age-related cataract, left eye: Secondary | ICD-10-CM | POA: Diagnosis not present

## 2015-05-11 DIAGNOSIS — Z23 Encounter for immunization: Secondary | ICD-10-CM | POA: Diagnosis not present

## 2015-06-07 DIAGNOSIS — H25812 Combined forms of age-related cataract, left eye: Secondary | ICD-10-CM | POA: Diagnosis not present

## 2015-06-07 DIAGNOSIS — H401332 Pigmentary glaucoma, bilateral, moderate stage: Secondary | ICD-10-CM | POA: Diagnosis not present

## 2015-08-02 DIAGNOSIS — H401332 Pigmentary glaucoma, bilateral, moderate stage: Secondary | ICD-10-CM | POA: Diagnosis not present

## 2015-08-02 DIAGNOSIS — H25812 Combined forms of age-related cataract, left eye: Secondary | ICD-10-CM | POA: Diagnosis not present

## 2015-09-09 ENCOUNTER — Ambulatory Visit (INDEPENDENT_AMBULATORY_CARE_PROVIDER_SITE_OTHER): Payer: Medicare Other | Admitting: Family Medicine

## 2015-09-09 ENCOUNTER — Encounter: Payer: Self-pay | Admitting: Family Medicine

## 2015-09-09 VITALS — BP 138/82 | HR 78 | Temp 97.9°F | Resp 16 | Ht 64.0 in | Wt 241.8 lb

## 2015-09-09 DIAGNOSIS — E669 Obesity, unspecified: Secondary | ICD-10-CM | POA: Diagnosis not present

## 2015-09-09 DIAGNOSIS — I10 Essential (primary) hypertension: Secondary | ICD-10-CM | POA: Diagnosis not present

## 2015-09-09 DIAGNOSIS — Z6841 Body Mass Index (BMI) 40.0 and over, adult: Secondary | ICD-10-CM | POA: Diagnosis not present

## 2015-09-09 DIAGNOSIS — H409 Unspecified glaucoma: Secondary | ICD-10-CM

## 2015-09-09 DIAGNOSIS — N181 Chronic kidney disease, stage 1: Secondary | ICD-10-CM | POA: Diagnosis not present

## 2015-09-09 DIAGNOSIS — R7309 Other abnormal glucose: Secondary | ICD-10-CM | POA: Diagnosis not present

## 2015-09-09 DIAGNOSIS — Z23 Encounter for immunization: Secondary | ICD-10-CM

## 2015-09-09 DIAGNOSIS — Z0000 Encounter for general adult medical examination without abnormal findings: Secondary | ICD-10-CM | POA: Diagnosis not present

## 2015-09-09 DIAGNOSIS — R739 Hyperglycemia, unspecified: Secondary | ICD-10-CM | POA: Diagnosis not present

## 2015-09-09 DIAGNOSIS — N189 Chronic kidney disease, unspecified: Secondary | ICD-10-CM | POA: Insufficient documentation

## 2015-09-09 LAB — COMPREHENSIVE METABOLIC PANEL
ALT: 11 U/L (ref 6–29)
AST: 15 U/L (ref 10–35)
Albumin: 3.8 g/dL (ref 3.6–5.1)
Alkaline Phosphatase: 51 U/L (ref 33–130)
BUN: 25 mg/dL (ref 7–25)
CHLORIDE: 102 mmol/L (ref 98–110)
CO2: 27 mmol/L (ref 20–31)
CREATININE: 1.13 mg/dL — AB (ref 0.60–0.93)
Calcium: 9.1 mg/dL (ref 8.6–10.4)
Glucose, Bld: 121 mg/dL — ABNORMAL HIGH (ref 65–99)
Potassium: 3.5 mmol/L (ref 3.5–5.3)
SODIUM: 141 mmol/L (ref 135–146)
Total Bilirubin: 1.4 mg/dL — ABNORMAL HIGH (ref 0.2–1.2)
Total Protein: 6.7 g/dL (ref 6.1–8.1)

## 2015-09-09 LAB — CBC WITH DIFFERENTIAL/PLATELET
BASOS PCT: 0 % (ref 0–1)
Basophils Absolute: 0 10*3/uL (ref 0.0–0.1)
Eosinophils Absolute: 0.3 10*3/uL (ref 0.0–0.7)
Eosinophils Relative: 7 % — ABNORMAL HIGH (ref 0–5)
HCT: 38.9 % (ref 36.0–46.0)
HEMOGLOBIN: 13.1 g/dL (ref 12.0–15.0)
LYMPHS PCT: 37 % (ref 12–46)
Lymphs Abs: 1.6 10*3/uL (ref 0.7–4.0)
MCH: 31.7 pg (ref 26.0–34.0)
MCHC: 33.7 g/dL (ref 30.0–36.0)
MCV: 94.2 fL (ref 78.0–100.0)
MONO ABS: 0.3 10*3/uL (ref 0.1–1.0)
MONOS PCT: 8 % (ref 3–12)
MPV: 12.4 fL (ref 8.6–12.4)
NEUTROS ABS: 2 10*3/uL (ref 1.7–7.7)
Neutrophils Relative %: 48 % (ref 43–77)
Platelets: 209 10*3/uL (ref 150–400)
RBC: 4.13 MIL/uL (ref 3.87–5.11)
RDW: 13.2 % (ref 11.5–15.5)
WBC: 4.2 10*3/uL (ref 4.0–10.5)

## 2015-09-09 LAB — HEMOGLOBIN A1C
Hgb A1c MFr Bld: 5.7 % — ABNORMAL HIGH (ref <5.7)
Mean Plasma Glucose: 117 mg/dL — ABNORMAL HIGH (ref <117)

## 2015-09-09 LAB — POCT URINALYSIS DIP (MANUAL ENTRY)
BILIRUBIN UA: NEGATIVE
BILIRUBIN UA: NEGATIVE
Blood, UA: NEGATIVE
GLUCOSE UA: NEGATIVE
LEUKOCYTES UA: NEGATIVE
Nitrite, UA: NEGATIVE
Protein Ur, POC: NEGATIVE
SPEC GRAV UA: 1.025
Urobilinogen, UA: 0.2
pH, UA: 5

## 2015-09-09 MED ORDER — ZOSTER VACCINE LIVE 19400 UNT/0.65ML ~~LOC~~ SOLR: 0.6500 mL | Freq: Once | SUBCUTANEOUS | Status: DC

## 2015-09-09 MED ORDER — VALSARTAN-HYDROCHLOROTHIAZIDE 320-25 MG PO TABS: ORAL_TABLET | ORAL | Status: DC

## 2015-09-09 NOTE — Patient Instructions (Signed)

## 2015-09-09 NOTE — Progress Notes (Signed)
Subjective:    Patient ID: Tammy Mccullough, female    DOB: [birth year], 75 y.o.   MRN: [id number]  09/09/2015  Annual Exam   HPI This 75 y.o. female presents for Annual Wellness Examination.  Last physical:  07-25-2014 Pap smear: n/a Mammogram:  09/04/2014; needs to call. Colonoscopy:  10/16/2014 Bone density:  08/21/2014 TDAP:  2007 Pneumovax:  2014; Prevnar 2015 Zostavax:  never Influenza:  05/11/2015 Eye exam:  S/p cataract resection; regularly; [place].  Glaucoma in 07/2015. Dental exam:  Last week.   HTN: Patient reports good compliance with medication, good tolerance to medication, and good symptom control.  Does not check BP at home.    Hyperglycemia: glucose 115 on 02/2015; Hgba1c 5.5.  Renal insufficiency:  Creatinine 1.110 02/2015  Glaucoma:  Patient reports good compliance with medication, good tolerance to medication, and good symptom control.  ;   TOPS: needs height and weight that should be.    Review of Systems  Constitutional: Negative for fever, chills, diaphoresis, activity change, appetite change, fatigue and unexpected weight change.  HENT: Negative for congestion, dental problem, drooling, ear discharge, ear pain, facial swelling, hearing loss, mouth sores, nosebleeds, postnasal drip, rhinorrhea, sinus pressure, sneezing, sore throat, tinnitus, trouble swallowing and voice change.   Eyes: Negative for photophobia, pain, discharge, redness, itching and visual disturbance.  Respiratory: Negative for apnea, cough, choking, chest tightness, shortness of breath, wheezing and stridor.   Cardiovascular: Negative for chest pain, palpitations and leg swelling.  Gastrointestinal: Negative for nausea, vomiting, abdominal pain, diarrhea, constipation, blood in stool, abdominal distention, anal bleeding and rectal pain.  Endocrine: Negative for cold intolerance, heat intolerance, polydipsia, polyphagia and polyuria.  Genitourinary: Negative for dysuria, urgency, frequency,  hematuria, flank pain, decreased urine volume, vaginal bleeding, vaginal discharge, enuresis, difficulty urinating, genital sores, vaginal pain, menstrual problem, pelvic pain and dyspareunia.  Musculoskeletal: Negative for myalgias, back pain, joint swelling, arthralgias, gait problem, neck pain and neck stiffness.  Skin: Negative for color change, pallor, rash and wound.  Allergic/Immunologic: Negative for environmental allergies, food allergies and immunocompromised state.  Neurological: Negative for dizziness, tremors, seizures, syncope, facial asymmetry, speech difficulty, weakness, light-headedness, numbness and headaches.  Hematological: Negative for adenopathy. Does not bruise/bleed easily.  Psychiatric/Behavioral: Negative for suicidal ideas, hallucinations, behavioral problems, confusion, sleep disturbance, self-injury, dysphoric mood, decreased concentration and agitation. The patient is not nervous/anxious and is not hyperactive.     Past Medical History  Diagnosis Date  . Glaucoma   . Hypertension   . Cataract   . Arthritis     B knees.   Past Surgical History  Procedure Laterality Date  . Cataract extraction Right 07-2014  . Colonoscopy      10 +yrs ago in [place], normal per pt  . Vaginal delivery      x2   No Known Allergies Current Outpatient Prescriptions  Medication Sig Dispense Refill  . aspirin 81 MG tablet Take 81 mg by mouth daily.    . bimatoprost (LUMIGAN) 0.01 % SOLN 1 drop at bedtime.    . Brimonidine Tartrate-Timolol (COMBIGAN OP) Apply to eye 2 (two) times daily.    . Brinzolamide (AZOPT OP) Apply to eye 3 (three) times daily.    . valsartan-hydrochlorothiazide (DIOVAN-HCT) 320-25 MG tablet TAKE 1 TABLET BY MOUTH DAILY. 90 tablet 3  . zoster vaccine live, PF, (ZOSTAVAX) 29562 UNT/0.65ML injection Inject 19,400 Units into the skin once. 1 each 0   No current facility-administered medications for this visit.   Social History  Social History  . Marital  Status: Single    Spouse Name: N/A  . Number of Children: N/A  . Years of Education: N/A   Occupational History  . retired    Social History Main Topics  . Smoking status: Never Smoker   . Smokeless tobacco: Never Used  . Alcohol Use: 3.6 oz/week    6 Standard drinks or equivalent per week     Comment: 2-3  . Drug Use: No  . Sexual Activity: Not Currently   Other Topics Concern  . Not on file   Social History Narrative   Marital status: widowed since 2010 after 63 years of marriage.  Not dating but would like to be.      Children:  2 Children; no grandchildren; 2 adopted grandchildren.      Living: alone with 2 cats.      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      Tobacco: never      Alcohol: weekends liquor; some during the week.  3-5 cups per week.      Drugs: none      Exercise: water aerobics three days per week.  [employer].      Seatbelt: 100%      Guns: none      ADLs:: indepdent with ADLs.   Drives.      Advanced Directives: yes; healthcare POA:  Older brother [name]);  FULL CODE but no prolonged measures.     Family History  Problem Relation Age of Onset  . Diabetes Mother   . Heart disease Mother     unsure  . Stroke Mother   . COPD Father   . Diabetes Sister   . Hypertension Sister   . Diabetes Brother   . Diabetes Brother   . Colon cancer Neg Hx   . Rectal cancer Neg Hx   . Stomach cancer Neg Hx        Objective:    BP 138/82 mmHg  Pulse 78  Temp(Src) 97.9 F (36.6 C) (Oral)  Resp 16  Ht 5\' 4"  (1.626 m)  Wt 241 lb 12.8 oz (109.68 kg)  BMI 41.48 kg/m2 Physical Exam  Constitutional: She is oriented to person, place, and time. She appears well-developed and well-nourished. No distress.  HENT:  Head: Normocephalic and atraumatic.  Right Ear: External ear normal.  Left Ear: External ear normal.  Nose: Nose normal.  Mouth/Throat: Oropharynx is clear and moist.  Eyes:  Conjunctivae and EOM are normal. Pupils are equal, round, and reactive to light.  Neck: Normal range of motion and full passive range of motion without pain. Neck supple. No JVD present. Carotid bruit is not present. No thyromegaly present.  Cardiovascular: Normal rate, regular rhythm and normal heart sounds.  Exam reveals no gallop and no friction rub.   No murmur heard. Pulmonary/Chest: Effort normal and breath sounds normal. She has no wheezes. She has no rales.  Abdominal: Soft. Bowel sounds are normal. She exhibits no distension and no mass. There is no tenderness. There is no rebound and no guarding.  Musculoskeletal:       Right shoulder: Normal.       Left shoulder: Normal.       Cervical back: Normal.  Lymphadenopathy:    She has no cervical adenopathy.  Neurological: She is alert and oriented to person, place, and time. She has normal reflexes. No cranial nerve deficit. She exhibits normal muscle  tone. Coordination normal.  Skin: Skin is warm and dry. No rash noted. She is not diaphoretic. No erythema. No pallor.  Psychiatric: She has a normal mood and affect. Her behavior is normal. Judgment and thought content normal.  Nursing note and vitals reviewed.  Results for orders placed or performed in visit on 09/09/15  POCT urinalysis dipstick  Result Value Ref Range   Color, UA yellow yellow   Clarity, UA clear clear   Glucose, UA negative negative   Bilirubin, UA negative negative   Ketones, POC UA negative negative   Spec Grav, UA 1.025    Blood, UA negative negative   pH, UA 5.0    Protein Ur, POC negative negative   Urobilinogen, UA 0.2    Nitrite, UA Negative Negative   Leukocytes, UA Negative Negative       Assessment & Plan:   1. Encounter for Medicare annual wellness exam   2. Essential hypertension   3. Glaucoma   4. Hyperglycemia   5. Chronic renal insufficiency, stage 1   6. Need for shingles vaccine   7. Obesity   8. BMI 40.0-44.9, adult ([place])     -anticipatory guidance provided --- weight loss, exercise, ASA 81mg  daily, 3 servings dairy daily. -Immunizations reviewed; rx for Zostavax provided. -Colonoscopy, mammogram, bone density scan UTD. -obtain labs. -BP controlled; refill of medication. -independent with ADLs; no evidence of depression; low fall risk; no hearing loss.  No urinary incontinence.   Orders Placed This Encounter  Procedures  . CBC with Differential/Platelet  . Comprehensive metabolic panel  . Hemoglobin A1c  . POCT urinalysis dipstick   Meds ordered this encounter  Medications  . zoster vaccine live, PF, (ZOSTAVAX) 02725 UNT/0.65ML injection    Sig: Inject 19,400 Units into the skin once.    Dispense:  1 each    Refill:  0  . aspirin 81 MG tablet    Sig: Take 81 mg by mouth daily.  . valsartan-hydrochlorothiazide (DIOVAN-HCT) 320-25 MG tablet    Sig: TAKE 1 TABLET BY MOUTH DAILY.    Dispense:  90 tablet    Refill:  3    Return in about 6 months (around 03/08/2016) for recheck.    Fenris Cauble Elayne Guerin, M.D. [facility] [address] [place], [place]  [address] [phone] phone [phone] fax

## 2015-09-09 NOTE — Progress Notes (Signed)
   Subjective:    Patient ID: Tammy Mccullough, female    DOB: Mar 08, 1941, 75 y.o.   MRN: [id number]  HPI    Review of Systems  Constitutional: Negative.   HENT: Negative.   Eyes: Positive for visual disturbance.       Cataract in left eye   Respiratory: Negative.   Cardiovascular: Negative.   Gastrointestinal: Negative.   Endocrine: Negative.   Genitourinary: Negative.   Musculoskeletal: Positive for arthralgias.  Skin: Negative.   Allergic/Immunologic: Negative.   Neurological: Negative.   Hematological: Negative.   Psychiatric/Behavioral: Negative.        Objective:   Physical Exam        Assessment & Plan:

## 2015-09-12 ENCOUNTER — Other Ambulatory Visit: Payer: Self-pay

## 2015-09-12 DIAGNOSIS — Z1231 Encounter for screening mammogram for malignant neoplasm of breast: Secondary | ICD-10-CM

## 2015-09-19 ENCOUNTER — Ambulatory Visit
Admission: RE | Admit: 2015-09-19 | Discharge: 2015-09-19 | Disposition: A | Discharge status: Home / Self Care | Payer: Medicare Other | Source: Ambulatory Visit

## 2015-09-19 DIAGNOSIS — Z1231 Encounter for screening mammogram for malignant neoplasm of breast: Secondary | ICD-10-CM

## 2015-09-23 ENCOUNTER — Encounter: Payer: Self-pay | Admitting: Family Medicine

## 2015-10-02 DIAGNOSIS — H25012 Cortical age-related cataract, left eye: Secondary | ICD-10-CM | POA: Diagnosis not present

## 2015-10-02 DIAGNOSIS — H2512 Age-related nuclear cataract, left eye: Secondary | ICD-10-CM | POA: Diagnosis not present

## 2015-10-29 DIAGNOSIS — H25012 Cortical age-related cataract, left eye: Secondary | ICD-10-CM | POA: Diagnosis not present

## 2015-10-29 DIAGNOSIS — H25812 Combined forms of age-related cataract, left eye: Secondary | ICD-10-CM | POA: Diagnosis not present

## 2015-10-29 DIAGNOSIS — H2512 Age-related nuclear cataract, left eye: Secondary | ICD-10-CM | POA: Diagnosis not present

## 2016-01-16 HISTORY — PX: CATARACT EXTRACTION: SUR2

## 2016-02-01 ENCOUNTER — Ambulatory Visit (INDEPENDENT_AMBULATORY_CARE_PROVIDER_SITE_OTHER): Payer: Medicare Other | Admitting: Emergency Medicine

## 2016-02-01 VITALS — BP 120/68 | HR 84 | Temp 98.2°F | Resp 14 | Ht 65.0 in | Wt 241.0 lb

## 2016-02-01 DIAGNOSIS — R531 Weakness: Secondary | ICD-10-CM

## 2016-02-01 DIAGNOSIS — H6123 Impacted cerumen, bilateral: Secondary | ICD-10-CM | POA: Diagnosis not present

## 2016-02-01 LAB — GLUCOSE, POCT (MANUAL RESULT ENTRY): POC Glucose: 109 mg/dl — AB (ref 70–99)

## 2016-02-01 NOTE — Patient Instructions (Addendum)
Take 1 Aleve every morning with food. Be on a soft diet for the next 3-4 days. If you continue to have symptoms please be seen by your  dentist.    IF you received an x-ray today, you will receive an invoice from [facility]. Please contact [facility] at [phone] with questions or concerns regarding your invoice.   IF you received labwork today, you will receive an invoice from [employer]. Please contact [facility] at [phone] with questions or concerns regarding your invoice.   Our billing staff will not be able to assist you with questions regarding bills from these companies.  You will be contacted with the lab results as soon as they are available. The fastest way to get your results is to activate your My Chart account. Instructions are located on the last page of this paperwork. If you have not heard from [place] regarding the results in 2 weeks, please contact this office.     Temporomandibular Joint Syndrome Temporomandibular joint (TMJ) syndrome is a condition that affects the joints between your jaw and your skull. The TMJs are located near your ears and allow your jaw to open and close. These joints and the nearby muscles are involved in all movements of the jaw. People with TMJ syndrome have pain in the area of these joints and muscles. Chewing, biting, or other movements of the jaw can be difficult or painful. TMJ syndrome can be caused by various things. In many cases, the condition is mild and goes away within a few weeks. For some people, the condition can become a long-term problem. CAUSES Possible causes of TMJ syndrome include:  Grinding your teeth or clenching your jaw. Some people do this when they are under stress.  Arthritis.  Injury to the jaw.  Head or neck injury.  Teeth or dentures that are not aligned well. In some cases, the cause of TMJ syndrome may not be known. SIGNS AND SYMPTOMS The most common symptom is  an aching pain on the side of the head in the area of the TMJ. Other symptoms may include:  Pain when moving your jaw, such as when chewing or biting.  Being unable to open your jaw all the way.  Making a clicking sound when you open your mouth.  Headache.  Earache.  Neck or shoulder pain. DIAGNOSIS Diagnosis can usually be made based on your symptoms, your medical history, and a physical exam. Your health care provider may check the range of motion of your jaw. Imaging tests, such as X-rays or an MRI, are sometimes done. You may need to see your dentist to determine if your teeth and jaw are lined up correctly. TREATMENT TMJ syndrome often goes away on its own. If treatment is needed, the options may include:  Eating soft foods and applying ice or heat.  Medicines to relieve pain or inflammation.  Medicines to relax the muscles.  A splint, bite plate, or mouthpiece to prevent teeth grinding or jaw clenching.  Relaxation techniques or counseling to help reduce stress.  Transcutaneous electrical nerve stimulation (TENS). This helps to relieve pain by applying an electrical current through the skin.  Acupuncture. This is sometimes helpful to relieve pain.  Jaw surgery. This is rarely needed. HOME CARE INSTRUCTIONS  Take medicines only as directed by your health care provider.  Eat a soft diet if you are having trouble chewing.  Apply ice to the painful area.  Put ice in a plastic bag.  Place a  towel between your skin and the bag.  Leave the ice on for 20 minutes, 2-3 times a day.  Apply a warm compress to the painful area as directed.  Massage your jaw area and perform any jaw stretching exercises as recommended by your health care provider.  If you were given a mouthpiece or bite plate, wear it as directed.  Avoid foods that require a lot of chewing. Do not chew gum.  Keep all follow-up visits as directed by your health care provider. This is important. SEEK  MEDICAL CARE IF:  You are having trouble eating.  You have new or worsening symptoms. SEEK IMMEDIATE MEDICAL CARE IF:  Your jaw locks open or closed.   This information is not intended to replace advice given to you by your health care provider. Make sure you discuss any questions you have with your health care provider.   Document Released: 04/28/2001 Document Revised: 08/24/2014 Document Reviewed: 03/08/2014 Elsevier Interactive Patient Education [employer].

## 2016-02-01 NOTE — Progress Notes (Signed)
By signing my name below, I, Tammy Mccullough, attest that this documentation has been prepared under the direction and in the presence of Tammy Queen, MD.  Electronically Signed: Verlee Mccullough, Medical Scribe. 02/01/2016. 8:52 AM.   Chief Complaint:  Chief Complaint  Patient presents with  . Ear Pain    3 days been swimming    HPI: Tammy Mccullough is a 75 y.o. female who reports to [facility] today complaining of right ear pain. Pt reports clogged ears and felt like there is liquid inside her ear. Pt has put sweet oil and peroxide inside her ear without relief to her symptoms. Pt states the pain was really bad last night but got better when she woke up. Pt thinks it's swimmers ear since she does water aerobics at the gym- pt reports she doesn't know the symptoms of swimmers ear. Pt had to start cleaning her ears every year since she turned 75 y/o. Pt tries not to use Q-Tips when cleaning her ear.   Past Medical History  Diagnosis Date  . Glaucoma   . Hypertension   . Cataract   . Arthritis     B knees.   Past Surgical History  Procedure Laterality Date  . Cataract extraction Right 07-2014  . Colonoscopy      10 +yrs ago in [place], normal per pt  . Vaginal delivery      x2   Social History   Social History  . Marital Status: Single    Spouse Name: N/A  . Number of Children: N/A  . Years of Education: N/A   Occupational History  . retired    Social History Main Topics  . Smoking status: Never Smoker   . Smokeless tobacco: Never Used  . Alcohol Use: 3.6 oz/week    6 Standard drinks or equivalent per week     Comment: 2-3  . Drug Use: No  . Sexual Activity: Not Currently   Other Topics Concern  . None   Social History Narrative   Marital status: widowed since 2010 after 15 years of marriage.  Not dating but would like to be.      Children:  2 Children; no grandchildren; 2 adopted grandchildren.      Living: alone with 2 cats.      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      Tobacco: never      Alcohol: weekends liquor; some during the week.  3-5 cups per week.      Drugs: none      Exercise: water aerobics three days per week.  [employer].      Seatbelt: 100%      Guns: none      ADLs:: indepdent with ADLs.   Drives.      Advanced Directives: yes; healthcare POA:  Older brother Tammy Mccullough);  FULL CODE but no prolonged measures.     Family History  Problem Relation Age of Onset  . Diabetes Mother   . Heart disease Mother     unsure  . Stroke Mother   . COPD Father   . Diabetes Sister   . Hypertension Sister   . Diabetes Brother   . Diabetes Brother   . Colon cancer Neg Hx   . Rectal cancer Neg Hx   . Stomach cancer Neg Hx    No Known Allergies Prior to Admission medications   Medication Sig Start Date End  Date Taking? Authorizing Provider  aspirin 81 MG tablet Take 81 mg by mouth daily.   Yes Historical Provider, MD  bimatoprost (LUMIGAN) 0.01 % SOLN 1 drop at bedtime.   Yes Historical Provider, MD  Brimonidine Tartrate-Timolol (COMBIGAN OP) Apply to eye 2 (two) times daily.   Yes Historical Provider, MD  Brinzolamide (AZOPT OP) Apply to eye 3 (three) times daily.   Yes Historical Provider, MD  valsartan-hydrochlorothiazide (DIOVAN-HCT) 320-25 MG tablet TAKE 1 TABLET BY MOUTH DAILY. 09/09/15  Yes Wardell Honour, MD  zoster vaccine live, PF, (ZOSTAVAX) 16109 UNT/0.65ML injection Inject 19,400 Units into the skin once. 09/09/15  Yes Wardell Honour, MD     ROS: The patient denies fevers, chills, night sweats, unintentional weight loss, chest pain, palpitations, wheezing, dyspnea on exertion, nausea, vomiting, abdominal pain, dysuria, hematuria, melena, numbness, weakness, or tingling.  All other systems have been reviewed and were otherwise negative with the exception of those mentioned in the HPI and as above.    PHYSICAL EXAM: Filed Vitals:   02/01/16 0838  BP:  130/74  Pulse: 77  Temp: 98.2 F (36.8 C)  Resp: 14   Body mass index is 40.1 kg/(m^2).   General: Alert and cooperative, no acute distress HEENT:  Normocephalic, atraumatic, oropharynx patent. Wax impaction in both ears. Throat nl. No pain with movement of right ear helix. After removal of the wax the eardrum was normal. The ear canal was normal. There is significant tenderness anteriorly over the TMJ joint with opening of the jaw. Eye: Tammy Mccullough [facility] Cardiovascular:  Regular rate and rhythm, no rubs murmurs or gallops.  No Carotid bruits, radial pulse intact. No pedal edema.  Respiratory: Clear to auscultation bilaterally.  No wheezes, rales, or rhonchi.  No cyanosis, no use of accessory musculature Abdominal: No organomegaly, abdomen is soft and non-tender, positive bowel sounds.  No masses. Musculoskeletal: Gait intact. No edema, tenderness Skin: No rashes. Neurologic: Facial musculature symmetric. Psychiatric: Patient acts appropriately throughout our interaction. Lymphatic: No cervical or submandibular lymphadenopathy  LABS: Results for orders placed or performed in visit on 02/01/16  POCT glucose (manual entry)  Result Value Ref Range   POC Glucose 109 (A) 70 - 99 mg/dl   EKG/XRAY:   Primary read interpreted by Dr. Everlene Farrier at [facility].   ASSESSMENT/PLAN: Patient had bilateral wax impaction in her years which was removed. She has persistent tenderness over the right TMJ. While laying down after Colace was placed in the left external auditory canal patient appeared to be unresponsive. When I arrived to the room and should the patient the first 10 seconds she did not respond but had a normal pulse and normal respirations. She then sat up and said she was just resting and thought she had been receiving a massage. Patient was totally lucid and fine without any complaints when she sat up and spoke. I did check her sugar which was 109  and repeated  her vital signs   Gross sideeffects,  risk and benefits, and alternatives of medications d/w patient. Patient is aware that all medications have potential sideeffects and we are unable to predict every sideeffect or drug-drug interaction that may occur.  Tammy Queen MD 02/01/2016 8:52 AM

## 2016-02-11 ENCOUNTER — Ambulatory Visit (INDEPENDENT_AMBULATORY_CARE_PROVIDER_SITE_OTHER): Payer: Medicare Other | Admitting: Podiatry

## 2016-02-11 ENCOUNTER — Encounter: Payer: Self-pay | Admitting: Podiatry

## 2016-02-11 VITALS — BP 133/71 | HR 67 | Resp 16

## 2016-02-11 DIAGNOSIS — L600 Ingrowing nail: Secondary | ICD-10-CM

## 2016-02-11 MED ORDER — NEOMYCIN-POLYMYXIN-HC 1 % OT SOLN: OTIC | Status: DC

## 2016-02-11 NOTE — Patient Instructions (Signed)

## 2016-02-11 NOTE — Progress Notes (Signed)
She presents today with a chief complaint of a painful lateral nail border borders of the third digit of the right foot. States that it is sharp and incurvated and is always painful.  Objective: Vital signs are stable she is alert and oriented 3 pulses are strongly palpable. Neurologic sensorium is intact. Deep tendon reflexes are intact. Muscle strength is normal. Orthopedic evaluation does demonstrate mild flexible hammertoe deformities. Cutaneous evaluation was a sharp incurvated nail margin. The border third digit right foot with pain on palpation.  Assessment: Ingrown nail tibial border third digit right foot.  Plan: Performed a chemical matrixectomy to the lateral margin of the third digit right foot after 2 mL of local anesthetic was administered. She tolerated this procedure well and I will follow-up with her in 1 week. She was given both oral home-going instructions for care and soaking of the toe as well as a prescription for Cortisporin Otic to be applied twice daily after soaking. She will call with questions or concerns.

## 2016-02-12 ENCOUNTER — Telehealth: Payer: Self-pay | Admitting: *Deleted

## 2016-02-12 NOTE — Telephone Encounter (Signed)
Pt asked when she could swim. I left message informing pt as long as there was redness, drainage or a scab she needed to remain out of pools, lakes and oceans.

## 2016-02-15 ENCOUNTER — Ambulatory Visit (INDEPENDENT_AMBULATORY_CARE_PROVIDER_SITE_OTHER): Payer: Medicare Other | Admitting: Physician Assistant

## 2016-02-15 VITALS — BP 136/82 | HR 88 | Temp 98.0°F | Resp 18 | Ht 65.0 in | Wt 242.0 lb

## 2016-02-15 DIAGNOSIS — M79675 Pain in left toe(s): Secondary | ICD-10-CM | POA: Diagnosis not present

## 2016-02-15 DIAGNOSIS — S91209A Unspecified open wound of unspecified toe(s) with damage to nail, initial encounter: Secondary | ICD-10-CM

## 2016-02-15 DIAGNOSIS — S91202A Unspecified open wound of left great toe with damage to nail, initial encounter: Secondary | ICD-10-CM | POA: Diagnosis not present

## 2016-02-15 NOTE — Progress Notes (Signed)
02/15/2016 10:12 AM   DOB: 03/25/1941 / MRN: [id number]  SUBJECTIVE:  Tammy Mccullough is a 75 y.o. female presenting for left great toenail pain that started this morning after striking the toenail on a bed post.  She complains that the toenail is now loose and it very painful.  She has had toenail problems in the past and she is seen by podiatry on a regular basis.  She has a history of mild prediabetes.    She has No Known Allergies.   She  has a past medical history of Glaucoma; Hypertension; Cataract; and Arthritis.    She  reports that she has never smoked. She has never used smokeless tobacco. She reports that she drinks about 3.6 oz of alcohol per week. She reports that she does not use illicit drugs. She  reports that she does not currently engage in sexual activity. The patient  has past surgical history that includes Cataract extraction (Right, 07-2014); Colonoscopy; and Vaginal delivery.  Her family history includes COPD in her father; Diabetes in her brother, brother, mother, and sister; Heart disease in her mother; Hypertension in her sister; Stroke in her mother. There is no history of Colon cancer, Rectal cancer, or Stomach cancer.  Review of Systems  Constitutional: Negative for fever and chills.  Eyes: Negative for blurred vision.  Respiratory: Negative for cough and shortness of breath.   Cardiovascular: Negative for chest pain.  Gastrointestinal: Negative for nausea and abdominal pain.  Genitourinary: Negative for dysuria, urgency and frequency.  Musculoskeletal: Negative for myalgias.  Skin: Negative for rash.  Neurological: Negative for dizziness, tingling and headaches.  Psychiatric/Behavioral: Negative for depression. The patient is not nervous/anxious.     Problem list and medications reviewed and updated by myself where necessary, and exist elsewhere in the encounter.   OBJECTIVE:  BP 136/82 mmHg  Pulse 88  Temp(Src) 98 F (36.7 C) (Oral)  Resp 18  Ht 5'  5" (1.651 m)  Wt 242 lb (109.77 kg)  BMI 40.27 kg/m2  SpO2 96%  Physical Exam  Constitutional: She is oriented to person, place, and time. She appears well-nourished. No distress.  Eyes: EOM are normal. Pupils are equal, round, and reactive to light.  Cardiovascular: Normal rate and regular rhythm.   Pulmonary/Chest: Effort normal and breath sounds normal.  Abdominal: She exhibits no distension.  Neurological: She is alert and oriented to person, place, and time. No cranial nerve deficit. Gait normal.  Skin: Skin is dry. She is not diaphoretic.  Psychiatric: She has a normal mood and affect.  Vitals reviewed.  Proccedure: Verbal consent obtained. Patient anesthestized via digital nerve block with 2% lido without and Marcaine 1:1 mix.  Toe prepped with betadine. Toe nail lifted away and xeroform gauze placed.  Dressing placed.  Wound care instructions discussed.     No results found for this or any previous visit (from the past 72 hour(s)).  No results found.  ASSESSMENT AND PLAN  Nuri was seen today for ingrown toenail.  Diagnoses and all orders for this visit:  Traumatic avulsion of nail plate of toe, initial encounter: Removed per procedure note. Will see her back PRN.      The patient was advised to call or return to clinic if she does not see an improvement in symptoms or to seek the care of the closest emergency department if she worsens with the above plan.   Philis Fendt, MHS, PA-C [facility] 02/15/2016  10:12 AM

## 2016-02-15 NOTE — Patient Instructions (Addendum)
WOUND CARE . Keep area clean and dry for 24 hours. Do not remove bandage, if applied. . After 24 hours, remove bandage and wash wound gently with mild soap and warm water. Reapply a new bandage after cleaning wound, if directed. . Continue daily cleansing with soap and water until stitches/staples are removed. . Do not apply any ointments or creams to the wound while stitches/staples are in place, as this may cause delayed healing. . Notify the office if you experience any of the following signs of infection: Swelling, redness, pus drainage, streaking, fever >101.0 F . Notify the office if you experience excessive bleeding that does not stop after 15-20 minutes of constant, firm pressure.    IF you received an x-ray today, you will receive an invoice from [facility]. Please contact [facility] at [phone] with questions or concerns regarding your invoice.   IF you received labwork today, you will receive an invoice from [employer]. Please contact [facility] at [phone] with questions or concerns regarding your invoice.   Our billing staff will not be able to assist you with questions regarding bills from these companies.  You will be contacted with the lab results as soon as they are available. The fastest way to get your results is to activate your My Chart account. Instructions are located on the last page of this paperwork. If you have not heard from [place] regarding the results in 2 weeks, please contact this office.

## 2016-02-20 DIAGNOSIS — H40053 Ocular hypertension, bilateral: Secondary | ICD-10-CM | POA: Diagnosis not present

## 2016-02-28 DIAGNOSIS — H40053 Ocular hypertension, bilateral: Secondary | ICD-10-CM | POA: Diagnosis not present

## 2016-03-03 ENCOUNTER — Encounter: Payer: Self-pay | Admitting: Podiatry

## 2016-03-03 ENCOUNTER — Ambulatory Visit (INDEPENDENT_AMBULATORY_CARE_PROVIDER_SITE_OTHER): Payer: Medicare Other | Admitting: Podiatry

## 2016-03-03 DIAGNOSIS — L600 Ingrowing nail: Secondary | ICD-10-CM

## 2016-03-03 NOTE — Progress Notes (Signed)
She presents today for follow-up of matrixectomy medial border third digit right foot. She states that is doing much better. She states that however in the meantime I have not the nail plate off of the left hallux.  Objective: Vital signs are stable alert and oriented 3 third digit of the right foot appears to be healing very nicely S he no signs of infection. A portion of the nail plate hallux left as been avulsed but appears to be healing on either fully.  Assessment: Well-healing surgical toe third right.  Plan: Discontinue Betadine sterile Epsom salts or warm soaks continue soaking to completely resolve no redness no soreness no drainage. She will cover during the day and leave open at bedtime.

## 2016-03-10 ENCOUNTER — Ambulatory Visit: Payer: Medicare Other | Admitting: Family Medicine

## 2016-03-11 DIAGNOSIS — H40053 Ocular hypertension, bilateral: Secondary | ICD-10-CM | POA: Diagnosis not present

## 2016-04-21 ENCOUNTER — Encounter: Payer: Self-pay | Admitting: Family Medicine

## 2016-04-21 ENCOUNTER — Ambulatory Visit (INDEPENDENT_AMBULATORY_CARE_PROVIDER_SITE_OTHER): Payer: Medicare Other | Admitting: Family Medicine

## 2016-04-21 VITALS — BP 116/76 | HR 75 | Temp 98.3°F | Resp 18 | Ht 65.0 in | Wt 235.6 lb

## 2016-04-21 DIAGNOSIS — R7302 Impaired glucose tolerance (oral): Secondary | ICD-10-CM | POA: Diagnosis not present

## 2016-04-21 DIAGNOSIS — H409 Unspecified glaucoma: Secondary | ICD-10-CM

## 2016-04-21 DIAGNOSIS — I10 Essential (primary) hypertension: Secondary | ICD-10-CM

## 2016-04-21 DIAGNOSIS — N181 Chronic kidney disease, stage 1: Secondary | ICD-10-CM

## 2016-04-21 DIAGNOSIS — Z23 Encounter for immunization: Secondary | ICD-10-CM | POA: Diagnosis not present

## 2016-04-21 LAB — COMPREHENSIVE METABOLIC PANEL
ALBUMIN: 3.8 g/dL (ref 3.6–5.1)
ALT: 11 U/L (ref 6–29)
AST: 17 U/L (ref 10–35)
Alkaline Phosphatase: 48 U/L (ref 33–130)
BUN: 26 mg/dL — ABNORMAL HIGH (ref 7–25)
CALCIUM: 9.2 mg/dL (ref 8.6–10.4)
CHLORIDE: 105 mmol/L (ref 98–110)
CO2: 26 mmol/L (ref 20–31)
CREATININE: 1.25 mg/dL — AB (ref 0.60–0.93)
GLUCOSE: 104 mg/dL — AB (ref 65–99)
Potassium: 3.9 mmol/L (ref 3.5–5.3)
SODIUM: 139 mmol/L (ref 135–146)
Total Bilirubin: 1.4 mg/dL — ABNORMAL HIGH (ref 0.2–1.2)
Total Protein: 6.7 g/dL (ref 6.1–8.1)

## 2016-04-21 LAB — CBC WITH DIFFERENTIAL/PLATELET
BASOS ABS: 42 {cells}/uL (ref 0–200)
Basophils Relative: 1 %
EOS PCT: 10 %
Eosinophils Absolute: 420 cells/uL (ref 15–500)
HCT: 37.3 % (ref 35.0–45.0)
HEMOGLOBIN: 12.4 g/dL (ref 11.7–15.5)
LYMPHS ABS: 1848 {cells}/uL (ref 850–3900)
LYMPHS PCT: 44 %
MCH: 31.2 pg (ref 27.0–33.0)
MCHC: 33.2 g/dL (ref 32.0–36.0)
MCV: 93.7 fL (ref 80.0–100.0)
MPV: 12.8 fL — ABNORMAL HIGH (ref 7.5–12.5)
Monocytes Absolute: 336 cells/uL (ref 200–950)
Monocytes Relative: 8 %
NEUTROS PCT: 37 %
Neutro Abs: 1554 cells/uL (ref 1500–7800)
Platelets: 201 10*3/uL (ref 140–400)
RBC: 3.98 MIL/uL (ref 3.80–5.10)
RDW: 13.4 % (ref 11.0–15.0)
WBC: 4.2 10*3/uL (ref 3.8–10.8)

## 2016-04-21 MED ORDER — VALSARTAN-HYDROCHLOROTHIAZIDE 320-25 MG PO TABS: ORAL_TABLET | ORAL | 3 refills | Status: DC

## 2016-04-21 NOTE — Progress Notes (Signed)
Subjective:    Patient ID: Tammy Mccullough, female    DOB: [birth year], 75 y.o.   MRN: [id number]  04/21/2016  Follow-up (6 month follow up)  By signing my name below, I, Tammy Mccullough, attest that this documentation has been prepared under the direction and in the presence of Tammy Baller, MD. Electronically Signed: Judithe Mccullough, ER Scribe. 04/21/2016. 8:33 AM.  HPI HPI Comments: ZOI [name] is a 75 y.o. female who presents to [facility] reporting for a six month follow up. I last saw her in January. Her glucose was 121 at that time with an A1C of 5.5. Her kidney fx was somewhat reduced with a creatinine 1.110 on 02/2015. She would like a flu shot today. She does not check her BP at home. She had cataract surgery several months ago. She sees her eye doctor every months. She denies fall allergies. She is starting water aerobics again three days per week. She denies CP, palpitations, SOB, cough, diarrhea, constipation, abdominal pain. She has swelling in her legs when she is on her feet all day. She endorses occasional GERD sx.    Review of Systems  Constitutional: Negative for chills, diaphoresis, fatigue and fever.  HENT: Negative for congestion.   Eyes: Negative for visual disturbance.  Respiratory: Negative for cough and shortness of breath.   Cardiovascular: Negative for chest pain, palpitations and leg swelling.  Gastrointestinal: Negative for abdominal pain, constipation, diarrhea, nausea and vomiting.  Endocrine: Negative for cold intolerance, heat intolerance, polydipsia, polyphagia and polyuria.  Neurological: Negative for dizziness, tremors, seizures, syncope, facial asymmetry, speech difficulty, weakness, light-headedness, numbness and headaches.    Past Medical History:  Diagnosis Date  . Arthritis    B knees.  . Cataract   . Glaucoma   . Hypertension    Past Surgical History:  Procedure Laterality Date  . CATARACT EXTRACTION Right 07-2014  . COLONOSCOPY     10 +yrs ago in [place], normal per pt  . VAGINAL DELIVERY     x2   Not on File Current Outpatient Prescriptions  Medication Sig Dispense Refill  . aspirin 81 MG tablet Take 81 mg by mouth daily. Reported on 02/15/2016    . bimatoprost (LUMIGAN) 0.01 % SOLN 1 drop at bedtime.    . Brimonidine Tartrate-Timolol (COMBIGAN OP) Apply to eye 2 (two) times daily.    . Brinzolamide (AZOPT OP) Apply to eye 3 (three) times daily. Reported on 02/15/2016    . NEOMYCIN-POLYMYXIN-HYDROCORTISONE (CORTISPORIN) 1 % SOLN otic solution Apply 1-2 drops to toe BID after soaking 10 mL 1  . valsartan-hydrochlorothiazide (DIOVAN-HCT) 320-25 MG tablet TAKE 1 TABLET BY MOUTH DAILY. 90 tablet 3  . zoster vaccine live, PF, (ZOSTAVAX) 57846 UNT/0.65ML injection Inject 19,400 Units into the skin once. 1 each 0   No current facility-administered medications for this visit.    Social History   Social History  . Marital status: Single    Spouse name: N/A  . Number of children: N/A  . Years of education: N/A   Occupational History  . retired    Social History Main Topics  . Smoking status: Never Smoker  . Smokeless tobacco: Never Used  . Alcohol use 3.6 oz/week    6 Standard drinks or equivalent per week     Comment: 2-3  . Drug use: No  . Sexual activity: Not Currently   Other Topics Concern  . Not on file   Social History Narrative   Marital status: widowed since  2010 after 15 years of marriage.  Not dating but would like to be.      Children:  2 Children; no grandchildren; 2 adopted grandchildren.      Living: alone with 2 cats.      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      Tobacco: never      Alcohol: weekends liquor; some during the week.  3-5 cups per week.      Drugs: none      Exercise: water aerobics three days per week.  [employer].      Seatbelt: 100%      Guns: none      ADLs:: indepdent with ADLs.   Drives.      Advanced  Directives: yes; healthcare POA:  Older brother [name]);  FULL CODE but no prolonged measures.     Family History  Problem Relation Age of Onset  . Diabetes Mother   . Heart disease Mother     unsure  . Stroke Mother   . COPD Father   . Diabetes Sister   . Hypertension Sister   . Diabetes Brother   . Diabetes Brother   . Colon cancer Neg Hx   . Rectal cancer Neg Hx   . Stomach cancer Neg Hx        Objective:    BP 116/76   Pulse 75   Temp 98.3 F (36.8 C) (Oral)   Resp 18   Ht 5\' 5"  (1.651 m)   Wt 235 lb 9.6 oz (106.9 kg)   SpO2 98%   BMI 39.21 kg/m  Physical Exam  Constitutional: She is oriented to person, place, and time. She appears well-developed and well-nourished. No distress.  HENT:  Head: Normocephalic and atraumatic.  Right Ear: External ear normal.  Left Ear: External ear normal.  Nose: Nose normal.  Mouth/Throat: Oropharynx is clear and moist.  Eyes: Conjunctivae and EOM are normal. Pupils are equal, round, and reactive to light.  Neck: Normal range of motion. Neck supple. Carotid bruit is not present. No thyromegaly present.  No carotid bruits  Cardiovascular: Normal rate, regular rhythm, normal heart sounds and intact distal pulses.  Exam reveals no gallop and no friction rub.   No murmur heard. 1+ non pitting edema in bilateral lower extremities  Pulmonary/Chest: Effort normal and breath sounds normal. No respiratory distress. She has no wheezes. She has no rales.  Abdominal: Soft. Bowel sounds are normal. She exhibits no distension and no mass. There is no tenderness. There is no rebound and no guarding.  Musculoskeletal: Normal range of motion.  Lymphadenopathy:    She has no cervical adenopathy.  Neurological: She is alert and oriented to person, place, and time. No cranial nerve deficit. Coordination normal.  Skin: Skin is warm and dry. No rash noted. She is not diaphoretic. No erythema. No pallor.  Psychiatric: She has a normal mood and  affect. Her behavior is normal.  Nursing note and vitals reviewed.  Results for orders placed or performed in visit on 02/01/16  POCT glucose (manual entry)  Result Value Ref Range   POC Glucose 109 (A) 70 - 99 mg/dl       Assessment & Plan:   1. Flu vaccine need   2. Essential hypertension   3. Glaucoma   4. Glucose intolerance (impaired glucose tolerance)    -controlled. -counseled regarding glucose intolerance; recommend weight loss, exercise, and dietary modification. -discussed mild  renal insufficiency; recommend avoiding NSAIDs on a regular basis.  Recommend Tylenol as a medication for pain.  Will monitor closely at each visit.   Orders Placed This Encounter  Procedures  . Flu Vaccine QUAD 36+ mos IM  . CBC with Differential/Platelet  . Comprehensive metabolic panel  . Hemoglobin A1c   Meds ordered this encounter  Medications  . valsartan-hydrochlorothiazide (DIOVAN-HCT) 320-25 MG tablet    Sig: TAKE 1 TABLET BY MOUTH DAILY.    Dispense:  90 tablet    Refill:  3    No Follow-up on file.   I personally performed the services described in this documentation, which was scribed in my presence. The recorded information has been reviewed and considered.  Kristi Elayne Guerin, M.D. [facility] [address] [place], [place]  [address] [phone] phone [phone] fax

## 2016-04-21 NOTE — Patient Instructions (Addendum)
   IF you received an x-ray today, you will receive an invoice from [facility]. Please contact [facility] at [phone] with questions or concerns regarding your invoice.   IF you received labwork today, you will receive an invoice from [facility]. Please contact [facility] at [phone] with questions or concerns regarding your invoice.   Our billing staff will not be able to assist you with questions regarding bills from these companies.  You will be contacted with the lab results as soon as they are available. The fastest way to get your results is to activate your My Chart account. Instructions are located on the last page of this paperwork. If you have not heard from us regarding the results in 2 weeks, please contact this office.    Hypertension Hypertension, commonly called high blood pressure, is when the force of blood pumping through your arteries is too strong. Your arteries are the blood vessels that carry blood from your heart throughout your body. A blood pressure reading consists of a higher number over a lower number, such as 110/72. The higher number (systolic) is the pressure inside your arteries when your heart pumps. The lower number (diastolic) is the pressure inside your arteries when your heart relaxes. Ideally you want your blood pressure below 120/80. Hypertension forces your heart to work harder to pump blood. Your arteries may become narrow or stiff. Having untreated or uncontrolled hypertension can cause heart attack, stroke, kidney disease, and other problems. RISK FACTORS Some risk factors for high blood pressure are controllable. Others are not.  Risk factors you cannot control include:   Race. You may be at higher risk if you are African American.  Age. Risk increases with age.  Gender. Men are at higher risk than women before age 45 years. After age 65, women are at higher risk than men. Risk factors you can  control include:  Not getting enough exercise or physical activity.  Being overweight.  Getting too much fat, sugar, calories, or salt in your diet.  Drinking too much alcohol. SIGNS AND SYMPTOMS Hypertension does not usually cause signs or symptoms. Extremely high blood pressure (hypertensive crisis) may cause headache, anxiety, shortness of breath, and nosebleed. DIAGNOSIS To check if you have hypertension, your health care provider will measure your blood pressure while you are seated, with your arm held at the level of your heart. It should be measured at least twice using the same arm. Certain conditions can cause a difference in blood pressure between your right and left arms. A blood pressure reading that is higher than normal on one occasion does not mean that you need treatment. If it is not clear whether you have high blood pressure, you may be asked to return on a different day to have your blood pressure checked again. Or, you may be asked to monitor your blood pressure at home for 1 or more weeks. TREATMENT Treating high blood pressure includes making lifestyle changes and possibly taking medicine. Living a healthy lifestyle can help lower high blood pressure. You may need to change some of your habits. Lifestyle changes may include:  Following the DASH diet. This diet is high in fruits, vegetables, and whole grains. It is low in salt, red meat, and added sugars.  Keep your sodium intake below 2,300 mg per day.  Getting at least 30-45 minutes of aerobic exercise at least 4 times per week.  Losing weight if necessary.  Not smoking.  Limiting alcoholic beverages.    Learning ways to reduce stress. Your health care provider may prescribe medicine if lifestyle changes are not enough to get your blood pressure under control, and if one of the following is true:  You are 24-71 years of age and your systolic blood pressure is above 140.  You are 67 years of age or older, and  your systolic blood pressure is above 150.  Your diastolic blood pressure is above 90.  You have diabetes, and your systolic blood pressure is over [id number] or your diastolic blood pressure is over 90.  You have kidney disease and your blood pressure is above 140/90.  You have heart disease and your blood pressure is above 140/90. Your personal target blood pressure may vary depending on your medical conditions, your age, and other factors. HOME CARE INSTRUCTIONS  Have your blood pressure rechecked as directed by your health care provider.   Take medicines only as directed by your health care provider. Follow the directions carefully. Blood pressure medicines must be taken as prescribed. The medicine does not work as well when you skip doses. Skipping doses also puts you at risk for problems.  Do not smoke.   Monitor your blood pressure at home as directed by your health care provider. SEEK MEDICAL CARE IF:   You think you are having a reaction to medicines taken.  You have recurrent headaches or feel dizzy.  You have swelling in your ankles.  You have trouble with your vision. SEEK IMMEDIATE MEDICAL CARE IF:  You develop a severe headache or confusion.  You have unusual weakness, numbness, or feel faint.  You have severe chest or abdominal pain.  You vomit repeatedly.  You have trouble breathing. MAKE SURE YOU:   Understand these instructions.  Will watch your condition.  Will get help right away if you are not doing well or get worse.   This information is not intended to replace advice given to you by your health care provider. Make sure you discuss any questions you have with your health care provider.   Document Released: 08/03/2005 Document Revised: 12/18/2014 Document Reviewed: 05/26/2013 Elsevier Interactive Patient Education 2016 [place] (Flu) Vaccine (Inactivated or Recombinant):  1. Why get vaccinated? Influenza ("flu") is a contagious  disease that spreads around the [place] every year, usually between October and May. Flu is caused by influenza viruses, and is spread mainly by coughing, sneezing, and close contact. Anyone can get flu. Flu strikes suddenly and can last several days. Symptoms vary by age, but can include:  fever/chills  sore throat  muscle aches  fatigue  cough  headache  runny or stuffy nose Flu can also lead to pneumonia and blood infections, and cause diarrhea and seizures in children. If you have a medical condition, such as heart or lung disease, flu can make it worse. Flu is more dangerous for some people. Infants and young children, people 6 years of age and older, pregnant women, and people with certain health conditions or a weakened immune system are at greatest risk. Each year thousands of people in the [place] die from flu, and many more are hospitalized. Flu vaccine can:  keep you from getting flu,  make flu less severe if you do get it, and  keep you from spreading flu to your family and other people. 2. Inactivated and recombinant flu vaccines A dose of flu vaccine is recommended every flu season. Children 6 months through 28 years of age may need two doses during the  same flu season. Everyone else needs only one dose each flu season. Some inactivated flu vaccines contain a very small amount of a mercury-based preservative called thimerosal. Studies have not shown thimerosal in vaccines to be harmful, but flu vaccines that do not contain thimerosal are available. There is no live flu virus in flu shots. They cannot cause the flu. There are many flu viruses, and they are always changing. Each year a new flu vaccine is made to protect against three or four viruses that are likely to cause disease in the upcoming flu season. But even when the vaccine doesn't exactly match these viruses, it may still provide some protection. Flu vaccine cannot prevent:  flu that is caused by  a virus not covered by the vaccine, or  illnesses that look like flu but are not. It takes about 2 weeks for protection to develop after vaccination, and protection lasts through the flu season. 3. Some people should not get this vaccine Tell the person who is giving you the vaccine:  If you have any severe, life-threatening allergies. If you ever had a life-threatening allergic reaction after a dose of flu vaccine, or have a severe allergy to any part of this vaccine, you may be advised not to get vaccinated. Most, but not all, types of flu vaccine contain a small amount of egg protein.  If you ever had Guillain-Barre Syndrome (also called GBS). Some people with a history of GBS should not get this vaccine. This should be discussed with your doctor.  If you are not feeling well. It is usually okay to get flu vaccine when you have a mild illness, but you might be asked to come back when you feel better. 4. Risks of a vaccine reaction With any medicine, including vaccines, there is a chance of reactions. These are usually mild and go away on their own, but serious reactions are also possible. Most people who get a flu shot do not have any problems with it. Minor problems following a flu shot include:  soreness, redness, or swelling where the shot was given  hoarseness  sore, red or itchy eyes  cough  fever  aches  headache  itching  fatigue If these problems occur, they usually begin soon after the shot and last 1 or 2 days. More serious problems following a flu shot can include the following:  There may be a small increased risk of Guillain-Barre Syndrome (GBS) after inactivated flu vaccine. This risk has been estimated at 1 or 2 additional cases per million people vaccinated. This is much lower than the risk of severe complications from flu, which can be prevented by flu vaccine.  Young children who get the flu shot along with pneumococcal vaccine (PCV13) and/or DTaP vaccine  at the same time might be slightly more likely to have a seizure caused by fever. Ask your doctor for more information. Tell your doctor if a child who is getting flu vaccine has ever had a seizure. Problems that could happen after any injected vaccine:  People sometimes faint after a medical procedure, including vaccination. Sitting or lying down for about 15 minutes can help prevent fainting, and injuries caused by a fall. Tell your doctor if you feel dizzy, or have vision changes or ringing in the ears.  Some people get severe pain in the shoulder and have difficulty moving the arm where a shot was given. This happens very rarely.  Any medication can cause a severe allergic reaction. Such reactions from a  vaccine are very rare, estimated at about 1 in a million doses, and would happen within a few minutes to a few hours after the vaccination. As with any medicine, there is a very remote chance of a vaccine causing a serious injury or death. The safety of vaccines is always being monitored. For more information, visit: http://www.aguilar.org/ 5. What if there is a serious reaction? What should I look for?  Look for anything that concerns you, such as signs of a severe allergic reaction, very high fever, or unusual behavior. Signs of a severe allergic reaction can include hives, swelling of the face and throat, difficulty breathing, a fast heartbeat, dizziness, and weakness. These would start a few minutes to a few hours after the vaccination. What should I do?  If you think it is a severe allergic reaction or other emergency that can't wait, call 9-1-1 and get the person to the nearest hospital. Otherwise, call your doctor.  Reactions should be reported to the Vaccine Adverse Event Reporting System (VAERS). Your doctor should file this report, or you can do it yourself through the VAERS web site at www.vaers.SamedayNews.es, or by calling 7747660586. VAERS does not give medical advice. 6. The  National Vaccine Injury Compensation Program The [employer] Vaccine Injury Compensation Program (VICP) is a federal program that was created to compensate people who may have been injured by certain vaccines. Persons who believe they may have been injured by a vaccine can learn about the program and about filing a claim by calling (580)644-9539 or visiting the [employer] website at GoldCloset.com.ee. There is a time limit to file a claim for compensation. 7. How can I learn more?  Ask your healthcare provider. He or she can give you the vaccine package insert or suggest other sources of information.  Call your local or state health department.  Contact the Centers for Disease Control and Prevention (CDC):  Call 7150945889 (1-800-CDC-INFO) or  Visit CDC's website at https://gibson.com/ Vaccine Information Statement Inactivated Influenza Vaccine (03/23/2014)   This information is not intended to replace advice given to you by your health care provider. Make sure you discuss any questions you have with your health care provider.   Document Released: 05/28/2006 Document Revised: 08/24/2014 Document Reviewed: 03/26/2014 Elsevier Interactive Patient Education [employer].

## 2016-04-22 LAB — HEMOGLOBIN A1C
Hgb A1c MFr Bld: 5.3 % (ref <5.7)
MEAN PLASMA GLUCOSE: 105 mg/dL

## 2016-04-30 DIAGNOSIS — H40053 Ocular hypertension, bilateral: Secondary | ICD-10-CM | POA: Diagnosis not present

## 2016-05-11 ENCOUNTER — Ambulatory Visit (INDEPENDENT_AMBULATORY_CARE_PROVIDER_SITE_OTHER): Payer: Medicare Other | Admitting: Podiatry

## 2016-05-11 DIAGNOSIS — L609 Nail disorder, unspecified: Secondary | ICD-10-CM

## 2016-05-11 DIAGNOSIS — B351 Tinea unguium: Secondary | ICD-10-CM

## 2016-05-11 DIAGNOSIS — L608 Other nail disorders: Secondary | ICD-10-CM

## 2016-05-11 DIAGNOSIS — M79609 Pain in unspecified limb: Principal | ICD-10-CM

## 2016-05-11 DIAGNOSIS — L603 Nail dystrophy: Secondary | ICD-10-CM

## 2016-05-11 DIAGNOSIS — M79673 Pain in unspecified foot: Secondary | ICD-10-CM | POA: Diagnosis not present

## 2016-05-11 NOTE — Progress Notes (Signed)
SUBJECTIVE Patient  presents to office today complaining of elongated, thickened nails. Pain while ambulating in shoes. Patient is unable to trim their own nails.   Patient also complains of loss of her left great toenail. She also complains that approximately one month ago she had a partial nail removal of the third digit right foot. She's concerned about infection.  OBJECTIVE General Patient is awake, alert, and oriented x 3 and in no acute distress. Derm Skin is dry and supple bilateral. Negative open lesions or macerations. Remaining integument unremarkable. Nails are tender, long, thickened and dystrophic with subungual debris, consistent with onychomycosis, 1-5 bilateral. No signs of infection noted. Vasc  DP and PT pedal pulses palpable bilaterally. Temperature gradient within normal limits.  Neuro Epicritic and protective threshold sensation diminished bilaterally.  Musculoskeletal Exam No symptomatic pedal deformities noted bilateral. Muscular strength within normal limits.  ASSESSMENT 1. Onychodystrophic nails 1-5 bilateral with hyperkeratosis of nails.  2. Onychomycosis of nail due to dermatophyte bilateral 3. Pain in foot bilateral  PLAN OF CARE 1. Patient evaluated today.  2. Instructed to maintain good pedal hygiene and foot care.  3. Mechanical debridement of nails 1-5 bilaterally performed using a nail nipper. Filed with dremel without incident.  4. Return to clinic in 6 mos to reevaluate the regrowth of the left great toenail.Edrick Kins, DPM

## 2016-05-21 DIAGNOSIS — H40053 Ocular hypertension, bilateral: Secondary | ICD-10-CM | POA: Diagnosis not present

## 2016-06-02 ENCOUNTER — Ambulatory Visit (INDEPENDENT_AMBULATORY_CARE_PROVIDER_SITE_OTHER): Payer: Medicare Other | Admitting: Physician Assistant

## 2016-06-02 VITALS — BP 140/74 | HR 87 | Temp 98.2°F | Resp 16 | Wt 243.0 lb

## 2016-06-02 DIAGNOSIS — Z23 Encounter for immunization: Secondary | ICD-10-CM

## 2016-06-02 DIAGNOSIS — S61214A Laceration without foreign body of right ring finger without damage to nail, initial encounter: Secondary | ICD-10-CM

## 2016-06-02 NOTE — Patient Instructions (Signed)

## 2016-06-04 DIAGNOSIS — H40052 Ocular hypertension, left eye: Secondary | ICD-10-CM | POA: Diagnosis not present

## 2016-06-04 DIAGNOSIS — H40051 Ocular hypertension, right eye: Secondary | ICD-10-CM | POA: Diagnosis not present

## 2016-06-08 NOTE — Progress Notes (Signed)
06/02/2016 3:03 PM   DOB: 15-Aug-1941 / MRN: [id number]  SUBJECTIVE:  Tammy Mccullough is a 75 y.o. female presenting for a laceration to the right ring finger at the level of the DIP.  erports she was using an [employer] and her hand got caught up in the blades.  She denies a loss of function and paresthesia distal the injury.  She applied a bandage at home along with ice and came straight here.   She has No Known Allergies.   She  has a past medical history of Arthritis; Cataract; Glaucoma; and Hypertension.    She  reports that she has never smoked. She has never used smokeless tobacco. She reports that she drinks about 3.6 oz of alcohol per week . She reports that she does not use drugs. She  reports that she does not currently engage in sexual activity. The patient  has a past surgical history that includes Cataract extraction (Right, 07-2014); Colonoscopy; Vaginal delivery; and Cataract extraction (Left, 01/16/2016).  Her family history includes COPD in her father; Diabetes in her brother, brother, mother, and sister; Heart disease in her mother; Hypertension in her sister; Stroke in her mother.  Review of Systems  Eyes: Negative for blurred vision.  Gastrointestinal: Negative for abdominal pain and nausea.  Musculoskeletal: Negative for myalgias.  Neurological: Negative for dizziness, tingling, tremors, sensory change and focal weakness.  Psychiatric/Behavioral: Negative for depression. The patient is not nervous/anxious.     The problem list and medications were reviewed and updated by myself where necessary and exist elsewhere in the encounter.   OBJECTIVE:  BP 140/74 (BP Location: Left Arm, Cuff Size: Large)   Pulse 87   Temp 98.2 F (36.8 C) (Oral)   Resp 16   Wt 243 lb (110.2 kg)   SpO2 95%   BMI 40.44 kg/m   Physical Exam  Constitutional: She is oriented to person, place, and time.  Cardiovascular: Normal rate and regular rhythm.   Pulmonary/Chest:  Effort normal and breath sounds normal.  Musculoskeletal: Normal range of motion. She exhibits no edema.       Hands: Neurological: She is alert and oriented to person, place, and time.   Risk and benefits discussed and verbal consent obtained. Anesthetic allergies reviewed. Patient anesthetized using 1:1 mix of 2% lidocaine without epi and Marcaine. The wound was cleansed thoroughly with soap and water. Sterile prep and drape. Wound closed with using 5-0 Ethilon suture material. Hemostasis achieved. Mupirocin applied to the wound and bandage placed. The patient tolerated well. Wound instructions were provided and the patient is to return in 10 days for suture removal.   No results found for this or any previous visit (from the past 72 hour(s)).  No results found.  ASSESSMENT AND PLAN  Aleicia was seen today for laceration.  Diagnoses and all orders for this visit:  Need for Tdap vaccination -     Tdap vaccine greater than or equal to 7yo IM  Laceration of right ring finger without foreign body without damage to nail, initial encounter: She has a rather complex laceration however the wound did approximate well.  RTC in 10 days for removal.  Sooner is pus, drainage, warmth or worsening tenderness.     The patient is advised to call or return to clinic if she does not see an improvement in symptoms, or to seek the care of the closest emergency department if she worsens with the above plan.   Philis Fendt, MHS, PA-C  [facility] 06/08/2016 3:03 PM

## 2016-06-12 ENCOUNTER — Ambulatory Visit (INDEPENDENT_AMBULATORY_CARE_PROVIDER_SITE_OTHER): Payer: Medicare Other | Admitting: Physician Assistant

## 2016-06-12 VITALS — BP 110/80 | HR 69 | Temp 98.3°F | Resp 16 | Ht 65.0 in | Wt 241.4 lb

## 2016-06-12 DIAGNOSIS — Z4802 Encounter for removal of sutures: Secondary | ICD-10-CM

## 2016-06-12 NOTE — Progress Notes (Signed)
   06/12/2016 9:12 AM   DOB: 06/20/1941 / MRN: [id number]  SUBJECTIVE:  Tammy Mccullough is a 75 y.o. female presenting for suture removal.  Reports feeling well.  No loss of function of the affected finger, fever, chills exquisite tenderness.    She has No Known Allergies.   She  has a past medical history of Arthritis; Cataract; Glaucoma; and Hypertension.    She  reports that she has never smoked. She has never used smokeless tobacco. She reports that she drinks about 3.6 oz of alcohol per week . She reports that she does not use drugs. She  reports that she does not currently engage in sexual activity. The patient  has a past surgical history that includes Cataract extraction (Right, 07-2014); Colonoscopy; Vaginal delivery; and Cataract extraction (Left, 01/16/2016).  Her family history includes COPD in her father; Diabetes in her brother, brother, mother, and sister; Heart disease in her mother; Hypertension in her sister; Stroke in her mother.  ROS  PER HPI  The problem list and medications were reviewed and updated by myself where necessary and exist elsewhere in the encounter.   OBJECTIVE:  BP 110/80 (BP Location: Right Arm, Patient Position: Sitting, Cuff Size: Large)   Pulse 69   Temp 98.3 F (36.8 C) (Oral)   Resp 16   Ht 5\' 5"  (1.651 m)   Wt 241 lb 6.4 oz (109.5 kg)   SpO2 96%   BMI 40.17 kg/m   Physical Exam  Cardiovascular: Normal rate.   Pulmonary/Chest: Effort normal.  Neurological: She is alert.  Skin:  Right ring finger wound well approximated without erythema, warmth or tenderness.  Wound well approximated with 9 SI sutures and removed without difficulty.  Bandage applied.     No results found for this or any previous visit (from the past 72 hour(s)).  No results found.  ASSESSMENT AND PLAN  Tammy Mccullough was seen today for follow-up.  Diagnoses and all orders for this visit:  Visit for suture removal: See exam.  Advised copious vaseline over the next  week to keep the skin supple.     The patient is advised to call or return to clinic if she does not see an improvement in symptoms, or to seek the care of the closest emergency department if she worsens with the above plan.   Philis Fendt, MHS, PA-C [facility] 06/12/2016 9:12 AM

## 2016-06-12 NOTE — Patient Instructions (Signed)
     IF you received an x-ray today, you will receive an invoice from [facility]. Please contact [facility] at [phone] with questions or concerns regarding your invoice.   IF you received labwork today, you will receive an invoice from [facility]. Please contact [facility] at [phone] with questions or concerns regarding your invoice.   Our billing staff will not be able to assist you with questions regarding bills from these companies.  You will be contacted with the lab results as soon as they are available. The fastest way to get your results is to activate your My Chart account. Instructions are located on the last page of this paperwork. If you have not heard from us regarding the results in 2 weeks, please contact this office.      

## 2016-06-18 DIAGNOSIS — H401332 Pigmentary glaucoma, bilateral, moderate stage: Secondary | ICD-10-CM | POA: Diagnosis not present

## 2016-06-18 DIAGNOSIS — H25812 Combined forms of age-related cataract, left eye: Secondary | ICD-10-CM | POA: Diagnosis not present

## 2016-06-23 DIAGNOSIS — M19072 Primary osteoarthritis, left ankle and foot: Secondary | ICD-10-CM | POA: Diagnosis not present

## 2016-06-23 DIAGNOSIS — M109 Gout, unspecified: Secondary | ICD-10-CM | POA: Diagnosis not present

## 2016-06-23 DIAGNOSIS — M7752 Other enthesopathy of left foot: Secondary | ICD-10-CM | POA: Diagnosis not present

## 2016-06-23 DIAGNOSIS — M659 Synovitis and tenosynovitis, unspecified: Secondary | ICD-10-CM | POA: Diagnosis not present

## 2016-06-26 DIAGNOSIS — M65872 Other synovitis and tenosynovitis, left ankle and foot: Secondary | ICD-10-CM | POA: Diagnosis not present

## 2016-06-26 DIAGNOSIS — M7752 Other enthesopathy of left foot: Secondary | ICD-10-CM | POA: Diagnosis not present

## 2016-06-26 DIAGNOSIS — M1A0720 Idiopathic chronic gout, left ankle and foot, without tophus (tophi): Secondary | ICD-10-CM | POA: Diagnosis not present

## 2016-07-01 DIAGNOSIS — H401332 Pigmentary glaucoma, bilateral, moderate stage: Secondary | ICD-10-CM | POA: Diagnosis not present

## 2016-07-03 DIAGNOSIS — M65872 Other synovitis and tenosynovitis, left ankle and foot: Secondary | ICD-10-CM | POA: Diagnosis not present

## 2016-07-03 DIAGNOSIS — M7752 Other enthesopathy of left foot: Secondary | ICD-10-CM | POA: Diagnosis not present

## 2016-07-14 ENCOUNTER — Ambulatory Visit (INDEPENDENT_AMBULATORY_CARE_PROVIDER_SITE_OTHER): Payer: Medicare Other | Admitting: Physician Assistant

## 2016-07-14 VITALS — BP 128/80 | HR 79 | Temp 98.4°F | Resp 17 | Ht 64.5 in | Wt 237.0 lb

## 2016-07-14 DIAGNOSIS — R202 Paresthesia of skin: Secondary | ICD-10-CM

## 2016-07-14 NOTE — Patient Instructions (Signed)
     IF you received an x-ray today, you will receive an invoice from [facility]. Please contact  Radiology at [phone] with questions or concerns regarding your invoice.   IF you received labwork today, you will receive an invoice from [facility]. Please contact [facility] at [phone] with questions or concerns regarding your invoice.   Our billing staff will not be able to assist you with questions regarding bills from these companies.  You will be contacted with the lab results as soon as they are available. The fastest way to get your results is to activate your My Chart account. Instructions are located on the last page of this paperwork. If you have not heard from us regarding the results in 2 weeks, please contact this office.      

## 2016-07-14 NOTE — Progress Notes (Signed)
   07/14/2016 11:08 AM   DOB: [birth year] / MRN: [id number]  SUBJECTIVE:  Tammy Mccullough is a 75 y.o. female presenting for right third finger paresthesia that started after a cut about 2/3 up the lateral aspect of the finger.  The cut occurred roughly one month ago. She denies any loss of function.  She just wants to make sure everything is okay today.   She has No Known Allergies.   She  has a past medical history of Arthritis; Cataract; Glaucoma; and Hypertension.    She  reports that she has never smoked. She has never used smokeless tobacco. She reports that she drinks about 3.6 oz of alcohol per week . She reports that she does not use drugs. She  reports that she does not currently engage in sexual activity. The patient  has a past surgical history that includes Cataract extraction (Right, 07-2014); Colonoscopy; Vaginal delivery; and Cataract extraction (Left, 01/16/2016).  Her family history includes COPD in her father; Diabetes in her brother, brother, mother, and sister; Heart disease in her mother; Hypertension in her sister; Stroke in her mother.  ROS  Per HPI  The problem list and medications were reviewed and updated by myself where necessary and exist elsewhere in the encounter.   OBJECTIVE:  BP 128/80 (BP Location: Right Arm, Patient Position: Sitting, Cuff Size: Normal)   Pulse 79   Temp 98.4 F (36.9 C) (Oral)   Resp 17   Ht 5' 4.5" (1.638 m)   Wt 237 lb (107.5 kg)   SpO2 98%   BMI 40.05 kg/m   Physical Exam  Constitutional: She is oriented to person, place, and time. She appears well-developed and well-nourished. No distress.  HENT:  Mouth/Throat: No oropharyngeal exudate.  Eyes: Conjunctivae and EOM are normal. Pupils are equal, round, and reactive to light.  Cardiovascular: Normal rate, regular rhythm and normal heart sounds.   Pulmonary/Chest: Effort normal and breath sounds normal.  Abdominal: Soft. Bowel sounds are normal.  Musculoskeletal: Normal  range of motion.       Hands: Neurological: She is alert and oriented to person, place, and time. She has normal reflexes. No cranial nerve deficit.  Skin: She is not diaphoretic.  Psychiatric: She has a normal mood and affect.    No results found for this or any previous visit (from the past 72 hour(s)).  No results found.  ASSESSMENT AND PLAN  [name] was seen today for finger injury.  Diagnoses and all orders for this visit:  [name] was seen today for finger injury.  Diagnoses and all orders for this visit:  Paresthesia Comments: secondary to previous laceration. Advised this is benign and will return in time and this is not worrisome.  She has no loss of function.      The patient is advised to call or return to clinic if she does not see an improvement in symptoms, or to seek the care of the closest emergency department if she worsens with the above plan.   Philis Fendt, MHS, PA-C [facility] 07/14/2016 11:08 AM

## 2016-07-21 DIAGNOSIS — H401332 Pigmentary glaucoma, bilateral, moderate stage: Secondary | ICD-10-CM | POA: Diagnosis not present

## 2016-07-24 ENCOUNTER — Encounter: Payer: Self-pay | Admitting: Family Medicine

## 2016-08-05 DIAGNOSIS — H1033 Unspecified acute conjunctivitis, bilateral: Secondary | ICD-10-CM | POA: Diagnosis not present

## 2016-08-05 DIAGNOSIS — H401332 Pigmentary glaucoma, bilateral, moderate stage: Secondary | ICD-10-CM | POA: Diagnosis not present

## 2016-09-04 ENCOUNTER — Other Ambulatory Visit: Payer: Self-pay | Admitting: Family Medicine

## 2016-09-04 DIAGNOSIS — Z1231 Encounter for screening mammogram for malignant neoplasm of breast: Secondary | ICD-10-CM

## 2016-09-08 DIAGNOSIS — H401132 Primary open-angle glaucoma, bilateral, moderate stage: Secondary | ICD-10-CM | POA: Diagnosis not present

## 2016-09-29 ENCOUNTER — Ambulatory Visit
Admission: RE | Admit: 2016-09-29 | Discharge: 2016-09-29 | Disposition: A | Discharge status: Home / Self Care | Payer: Medicare Other | Source: Ambulatory Visit | Attending: Family Medicine | Admitting: Family Medicine

## 2016-09-29 DIAGNOSIS — Z1231 Encounter for screening mammogram for malignant neoplasm of breast: Secondary | ICD-10-CM

## 2016-10-01 DIAGNOSIS — H401132 Primary open-angle glaucoma, bilateral, moderate stage: Secondary | ICD-10-CM | POA: Diagnosis not present

## 2016-10-20 ENCOUNTER — Ambulatory Visit (INDEPENDENT_AMBULATORY_CARE_PROVIDER_SITE_OTHER): Payer: Medicare Other | Admitting: Family Medicine

## 2016-10-20 ENCOUNTER — Encounter: Payer: Self-pay | Admitting: Family Medicine

## 2016-10-20 VITALS — BP 158/88 | HR 74 | Temp 98.6°F | Resp 18 | Ht 64.5 in | Wt 237.0 lb

## 2016-10-20 DIAGNOSIS — I10 Essential (primary) hypertension: Secondary | ICD-10-CM

## 2016-10-20 DIAGNOSIS — Z0000 Encounter for general adult medical examination without abnormal findings: Secondary | ICD-10-CM | POA: Diagnosis not present

## 2016-10-20 DIAGNOSIS — IMO0001 Reserved for inherently not codable concepts without codable children: Secondary | ICD-10-CM | POA: Insufficient documentation

## 2016-10-20 DIAGNOSIS — H401131 Primary open-angle glaucoma, bilateral, mild stage: Secondary | ICD-10-CM

## 2016-10-20 DIAGNOSIS — Z6841 Body Mass Index (BMI) 40.0 and over, adult: Secondary | ICD-10-CM | POA: Diagnosis not present

## 2016-10-20 DIAGNOSIS — E6609 Other obesity due to excess calories: Secondary | ICD-10-CM

## 2016-10-20 DIAGNOSIS — R7302 Impaired glucose tolerance (oral): Secondary | ICD-10-CM | POA: Diagnosis not present

## 2016-10-20 DIAGNOSIS — N181 Chronic kidney disease, stage 1: Secondary | ICD-10-CM | POA: Diagnosis not present

## 2016-10-20 LAB — POCT URINALYSIS DIP (MANUAL ENTRY)
BILIRUBIN UA: NEGATIVE
Blood, UA: NEGATIVE
GLUCOSE UA: NEGATIVE
Ketones, POC UA: NEGATIVE
LEUKOCYTES UA: NEGATIVE
NITRITE UA: NEGATIVE
PH UA: 5.5
Protein Ur, POC: NEGATIVE
Spec Grav, UA: 1.01
UROBILINOGEN UA: 0.2

## 2016-10-20 MED ORDER — VALSARTAN-HYDROCHLOROTHIAZIDE 320-25 MG PO TABS: ORAL_TABLET | ORAL | 3 refills | Status: DC

## 2016-10-20 NOTE — Patient Instructions (Addendum)
   IF you received an x-ray today, you will receive an invoice from [facility]. Please contact [facility] at [phone] with questions or concerns regarding your invoice.   IF you received labwork today, you will receive an invoice from [facility]. Please contact [facility] at [phone] with questions or concerns regarding your invoice.   Our billing staff will not be able to assist you with questions regarding bills from these companies.  You will be contacted with the lab results as soon as they are available. The fastest way to get your results is to activate your My Chart account. Instructions are located on the last page of this paperwork. If you have not heard from us regarding the results in 2 weeks, please contact this office.     Keeping You Healthy  Get These Tests  Blood Pressure- Have your blood pressure checked by your healthcare provider at least once a year.  Normal blood pressure is 120/80.  Weight- Have your body mass index (BMI) calculated to screen for obesity.  BMI is a measure of body fat based on height and weight.  You can calculate your own BMI at www.nhlbisupport.com/bmi/  Cholesterol- Have your cholesterol checked every year.  Diabetes- Have your blood sugar checked every year if you have high blood pressure, high cholesterol, a family history of diabetes or if you are overweight.  Pap Test - Have a pap test every 1 to 5 years if you have been sexually active.  If you are older than 65 and recent pap tests have been normal you may not need additional pap tests.  In addition, if you have had a hysterectomy  for benign disease additional pap tests are not necessary.  Mammogram-Yearly mammograms are essential for early detection of breast cancer  Screening for Colon Cancer- Colonoscopy starting at age 50. Screening may begin sooner depending on your family history and other health conditions.  Follow up colonoscopy as directed by your  Gastroenterologist.  Screening for Osteoporosis- Screening begins at age 65 with bone density scanning, sooner if you are at higher risk for developing Osteoporosis.  Get these medicines  Calcium with Vitamin D- Your body requires 1200-1500 mg of Calcium a day and 800-1000 IU of Vitamin D a day.  You can only absorb 500 mg of Calcium at a time therefore Calcium must be taken in 2 or 3 separate doses throughout the day.  Hormones- Hormone therapy has been associated with increased risk for certain cancers and heart disease.  Talk to your healthcare provider about if you need relief from menopausal symptoms.  Aspirin- Ask your healthcare provider about taking Aspirin to prevent Heart Disease and Stroke.  Get these Immuniztions  Flu shot- Every fall  Pneumonia shot- Once after the age of 65; if you are younger ask your healthcare provider if you need a pneumonia shot.  Tetanus- Every ten years.  Zostavax- Once after the age of 60 to prevent shingles.  Take these steps  Don't smoke- Your healthcare provider can help you quit. For tips on how to quit, ask your healthcare provider or go to www.smokefree.gov or call 1-800 QUIT-NOW.  Be physically active- Exercise 5 days a week for a minimum of 30 minutes.  If you are not already physically active, start slow and gradually work up to 30 minutes of moderate physical activity.  Try walking, dancing, bike riding, swimming, etc.  Eat a healthy diet- Eat a variety of healthy foods such as fruits, vegetables, whole grains, low   fat milk, low fat cheeses, yogurt, lean meats, chicken, fish, eggs, dried beans, tofu, etc.  For more information go to www.thenutritionsource.org  Dental visit- Brush and floss teeth twice daily; visit your dentist twice a year.  Eye exam- Visit your Optometrist or Ophthalmologist yearly.  Drink alcohol in moderation- Limit alcohol intake to one drink or less a day.  Never drink and drive.  Depression- Your emotional  health is as important as your physical health.  If you're feeling down or losing interest in things you normally enjoy, please talk to your healthcare provider.  Seat Belts- can save your life; always wear one  Smoke/Carbon Monoxide detectors- These detectors need to be installed on the appropriate level of your home.  Replace batteries at least once a year.  Violence- If anyone is threatening or hurting you, please tell your healthcare provider. Living Will/ Health care power of attorney- Discuss with your healthcare provider and family.    Tammy Mccullough , Thank you for taking time to come for your Medicare Wellness Visit. I appreciate your ongoing commitment to your health goals. Please review the following plan we discussed and let me know if I can assist you in the future.   These are the goals we discussed: Goals    None      This is a list of the screening recommended for you and due dates:  Health Maintenance  Topic Date Due  . Colon Cancer Screening  10/16/2019  . Tetanus Vaccine  06/02/2026  . Flu Shot  Completed  . DEXA scan (bone density measurement)  Completed  . Pneumonia vaccines  Completed

## 2016-10-20 NOTE — Progress Notes (Signed)
Subjective:    Patient ID: Tammy Mccullough, female    DOB: 1941-06-26, 76 y.o.   MRN: [id number]  10/20/2016  Annual Exam (with pelvic exam.)   HPI This 76 y.o. female presents for Annual Wellness Examination and follow-up of chronic medical conditions.  Last physical:  08-2015 Pap smear: n/a age Mammogram: 09-29-16 Colonoscopy: 10-2014 Bone density: 08-20-2014 Eye exam:  Every 2 months; glaucoma;  Dental exam:  Every six months.   Immunization History  Administered Date(s) Administered  . Influenza Split 05/03/2013, 05/11/2015  . Influenza,inj,Quad PF,36+ Mos 07/25/2014, 04/21/2016  . Pneumococcal Conjugate-13 05/03/2013, 07/25/2014  . Pneumococcal-Unspecified 05/03/2013  . Td 08/17/2005  . Tdap 06/02/2016  . Zoster 10/22/2015   BP Readings from Last 3 Encounters:  10/20/16 (!) 158/88  07/14/16 128/80  06/12/16 110/80   Wt Readings from Last 3 Encounters:  10/20/16 237 lb (107.5 kg)  07/14/16 237 lb (107.5 kg)  06/12/16 241 lb 6.4 oz (109.5 kg)     HTN: Patient reports good compliance with medication, good tolerance to medication, and good symptom control.  Water aerobics twice weekly; not checking blood pressure.  Muscle pain: doing son's grocery shopping and own; having B shoulder pain L>R.  Intermittent.  Just started two weeks ago.    Review of Systems  Constitutional: Negative for activity change, appetite change, chills, diaphoresis, fatigue, fever and unexpected weight change.  HENT: Negative for congestion, dental problem, drooling, ear discharge, ear pain, facial swelling, hearing loss, mouth sores, nosebleeds, postnasal drip, rhinorrhea, sinus pressure, sneezing, sore throat, tinnitus, trouble swallowing and voice change.   Eyes: Positive for photophobia and redness. Negative for pain, discharge, itching and visual disturbance.  Respiratory: Negative for apnea, cough, choking, chest tightness, shortness of breath, wheezing and stridor.   Cardiovascular:  Negative for chest pain, palpitations and leg swelling.  Gastrointestinal: Negative for abdominal distention, abdominal pain, anal bleeding, blood in stool, constipation, diarrhea, nausea, rectal pain and vomiting.  Endocrine: Negative for cold intolerance, heat intolerance, polydipsia, polyphagia and polyuria.  Genitourinary: Negative for decreased urine volume, difficulty urinating, dyspareunia, dysuria, enuresis, flank pain, frequency, genital sores, hematuria, menstrual problem, pelvic pain, urgency, vaginal bleeding, vaginal discharge and vaginal pain.  Musculoskeletal: Positive for arthralgias and myalgias. Negative for back pain, gait problem, joint swelling, neck pain and neck stiffness.  Skin: Negative for color change, pallor, rash and wound.  Allergic/Immunologic: Negative for environmental allergies, food allergies and immunocompromised state.  Neurological: Negative for dizziness, tremors, seizures, syncope, facial asymmetry, speech difficulty, weakness, light-headedness, numbness and headaches.  Hematological: Negative for adenopathy. Does not bruise/bleed easily.  Psychiatric/Behavioral: Negative for agitation, behavioral problems, confusion, decreased concentration, dysphoric mood, hallucinations, self-injury, sleep disturbance and suicidal ideas. The patient is not nervous/anxious and is not hyperactive.     Past Medical History:  Diagnosis Date  . Arthritis    B knees.  . Cataract   . Glaucoma   . Glucose intolerance (impaired glucose tolerance)   . Hypertension   . Renal insufficiency    Past Surgical History:  Procedure Laterality Date  . CATARACT EXTRACTION Right 07-2014  . CATARACT EXTRACTION Left 01/16/2016  . COLONOSCOPY     10 +yrs ago in [place], normal per pt  . VAGINAL DELIVERY     x2   No Known Allergies Current Outpatient Prescriptions  Medication Sig Dispense Refill  . aspirin 81 MG tablet Take 81 mg by mouth daily. Reported on 02/15/2016    .  valsartan-hydrochlorothiazide (DIOVAN-HCT) 320-25 MG tablet TAKE 1  TABLET BY MOUTH DAILY. 90 tablet 3  . AZOPT 1 % ophthalmic suspension     . COMBIGAN 0.2-0.5 % ophthalmic solution     . ZIOPTAN 0.0015 % SOLN      No current facility-administered medications for this visit.    Social History   Social History  . Marital status: Widowed    Spouse name: N/A  . Number of children: 2  . Years of education: N/A   Occupational History  . retired    Social History Main Topics  . Smoking status: Never Smoker  . Smokeless tobacco: Never Used  . Alcohol use 3.6 oz/week    6 Standard drinks or equivalent per week     Comment: 2-3  . Drug use: No  . Sexual activity: Not Currently   Other Topics Concern  . Not on file   Social History Narrative   Marital status: widowed since 2010 after 10 years of marriage.  Not dating but would like to be.      Children:  2 Children; no grandchildren; 2 adopted grandchildren.  One son is disabled; patient supports; son is obese.      Living: alone with 2 cats.      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      Tobacco: never      Alcohol: weekends liquor; some during the week.  3-5 cups per week.      Drugs: none      Exercise: water aerobics three days per week.  [employer].      Seatbelt: 100%;      Guns: none      ADLs:: independent with ADLs.   Drives.      Advanced Directives: yes; healthcare POA:  Older brother Tammy Mccullough);  FULL CODE but no prolonged measures.     Family History  Problem Relation Age of Onset  . Diabetes Mother   . Heart disease Mother     unsure  . COPD Father   . Diabetes Sister   . Hypertension Sister   . Kidney disease Sister   . Diabetes Brother   . Diabetes Brother   . Colon cancer Neg Hx   . Rectal cancer Neg Hx   . Stomach cancer Neg Hx        Objective:    BP (!) 158/88   Pulse 74   Temp 98.6 F (37 C) (Oral)   Resp 18   Ht 5'  4.5" (1.638 m)   Wt 237 lb (107.5 kg)   SpO2 100%   BMI 40.05 kg/m  Physical Exam  Constitutional: She is oriented to person, place, and time. She appears well-developed and well-nourished. No distress.  HENT:  Head: Normocephalic and atraumatic.  Right Ear: External ear normal.  Left Ear: External ear normal.  Nose: Nose normal.  Mouth/Throat: Oropharynx is clear and moist.  Eyes: Conjunctivae and EOM are normal. Pupils are equal, round, and reactive to light.  Neck: Normal range of motion and full passive range of motion without pain. Neck supple. No JVD present. Carotid bruit is not present. No thyromegaly present.  Cardiovascular: Normal rate, regular rhythm and normal heart sounds.  Exam reveals no gallop and no friction rub.   No murmur heard. Pulmonary/Chest: Effort normal and breath sounds normal. She has no wheezes. She has no rales. Right breast exhibits no inverted nipple, no mass, no nipple discharge, no skin change and no tenderness. Left  breast exhibits no inverted nipple, no mass, no nipple discharge, no skin change and no tenderness. Breasts are symmetrical.  Abdominal: Soft. Bowel sounds are normal. She exhibits no distension and no mass. There is no tenderness. There is no rebound and no guarding.  Musculoskeletal:       Right shoulder: Normal.       Left shoulder: Normal.       Cervical back: Normal.  Lymphadenopathy:    She has no cervical adenopathy.  Neurological: She is alert and oriented to person, place, and time. She has normal reflexes. No cranial nerve deficit. She exhibits normal muscle tone. Coordination normal.  Skin: Skin is warm and dry. No rash noted. She is not diaphoretic. No erythema. No pallor.  Psychiatric: She has a normal mood and affect. Her behavior is normal. Judgment and thought content normal.  Nursing note and vitals reviewed.  Results for orders placed or performed in visit on 10/20/16  POCT urinalysis dipstick  Result Value Ref Range    Color, UA yellow yellow   Clarity, UA clear clear   Glucose, UA negative negative   Bilirubin, UA negative negative   Ketones, POC UA negative negative   Spec Grav, UA 1.010    Blood, UA negative negative   pH, UA 5.5    Protein Ur, POC negative negative   Urobilinogen, UA 0.2    Nitrite, UA Negative Negative   Leukocytes, UA Negative Negative   Depression screen [facility] 2/9 10/20/2016 07/14/2016 06/12/2016 04/21/2016 02/15/2016  Decreased Interest 0 0 0 0 0  Down, Depressed, Hopeless 0 0 0 0 0  PHQ - 2 Score 0 0 0 0 0   Fall Risk  10/20/2016 07/14/2016 06/12/2016 04/21/2016 02/15/2016  Falls in the past year? No No No No No   Functional Status Survey: Is the patient deaf or have difficulty hearing?: No Does the patient have difficulty seeing, even when wearing glasses/contacts?: No Does the patient have difficulty concentrating, remembering, or making decisions?: No Does the patient have difficulty walking or climbing stairs?: No Does the patient have difficulty dressing or bathing?: No Does the patient have difficulty doing errands alone such as visiting a doctor's office or shopping?: No     Assessment & Plan:   1. Encounter for Medicare annual wellness exam   2. Essential hypertension   3. Glucose intolerance (impaired glucose tolerance)   4. Chronic renal impairment, stage 1   5. Primary open angle glaucoma of both eyes, mild stage   6. 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 -check blood pressure daily; call with readings in one week; will add Amlodipine if remains elevated. -obtain labs for chronic disease states; refill provided. -consider new shingles vaccine in upcoming year.   Orders Placed This Encounter  Procedures  . CBC with Differential/Platelet  . Comprehensive metabolic panel    . Hemoglobin A1c  . Microalbumin, urine  . Care order/instruction:    Please recheck BP.  Marland Kitchen POCT urinalysis dipstick   Meds ordered this encounter  Medications  . COMBIGAN 0.2-0.5 % ophthalmic solution  . AZOPT 1 % ophthalmic suspension  . ZIOPTAN 0.0015 % SOLN  . valsartan-hydrochlorothiazide (DIOVAN-HCT) 320-25 MG tablet    Sig: TAKE 1 TABLET BY MOUTH DAILY.    Dispense:  90 tablet  Refill:  3    Return in about 6 months (around 04/22/2017) for recheck high blood pressure.   Tom Macpherson Elayne Guerin, M.D. Primary Care at [facility] [place] [address] [place], [place]  [address] [phone] phone [phone] fax

## 2016-10-21 LAB — COMPREHENSIVE METABOLIC PANEL
ALK PHOS: 60 IU/L (ref 39–117)
ALT: 10 IU/L (ref 0–32)
AST: 22 IU/L (ref 0–40)
Albumin/Globulin Ratio: 1.3 (ref 1.2–2.2)
Albumin: 4 g/dL (ref 3.5–4.8)
BILIRUBIN TOTAL: 0.9 mg/dL (ref 0.0–1.2)
BUN/Creatinine Ratio: 21 (ref 12–28)
BUN: 25 mg/dL (ref 8–27)
CHLORIDE: 103 mmol/L (ref 96–106)
CO2: 24 mmol/L (ref 18–29)
CREATININE: 1.21 mg/dL — AB (ref 0.57–1.00)
Calcium: 9.3 mg/dL (ref 8.7–10.3)
GFR calc Af Amer: 50 mL/min/{1.73_m2} — ABNORMAL LOW (ref >59)
GFR calc non Af Amer: 43 mL/min/{1.73_m2} — ABNORMAL LOW (ref >59)
GLOBULIN, TOTAL: 3 g/dL (ref 1.5–4.5)
GLUCOSE: 103 mg/dL — AB (ref 65–99)
Potassium: 4.1 mmol/L (ref 3.5–5.2)
SODIUM: 142 mmol/L (ref 134–144)
Total Protein: 7 g/dL (ref 6.0–8.5)

## 2016-10-21 LAB — CBC WITH DIFFERENTIAL/PLATELET
BASOS ABS: 0 10*3/uL (ref 0.0–0.2)
Basos: 0 %
EOS (ABSOLUTE): 0.3 10*3/uL (ref 0.0–0.4)
Eos: 7 %
HEMATOCRIT: 39.6 % (ref 34.0–46.6)
Hemoglobin: 13 g/dL (ref 11.1–15.9)
Immature Grans (Abs): 0 10*3/uL (ref 0.0–0.1)
Immature Granulocytes: 0 %
LYMPHS ABS: 1.7 10*3/uL (ref 0.7–3.1)
Lymphs: 38 %
MCH: 31 pg (ref 26.6–33.0)
MCHC: 32.8 g/dL (ref 31.5–35.7)
MCV: 95 fL (ref 79–97)
MONOS ABS: 0.4 10*3/uL (ref 0.1–0.9)
Monocytes: 9 %
Neutrophils Absolute: 2 10*3/uL (ref 1.4–7.0)
Neutrophils: 46 %
Platelets: 205 10*3/uL (ref 150–379)
RBC: 4.19 x10E6/uL (ref 3.77–5.28)
RDW: 14.7 % (ref 12.3–15.4)
WBC: 4.4 10*3/uL (ref 3.4–10.8)

## 2016-10-21 LAB — HEMOGLOBIN A1C
ESTIMATED AVERAGE GLUCOSE: 105 mg/dL
HEMOGLOBIN A1C: 5.3 % (ref 4.8–5.6)

## 2016-10-21 LAB — MICROALBUMIN, URINE: Microalbumin, Urine: <3 ug/mL

## 2016-10-27 ENCOUNTER — Encounter: Payer: Self-pay | Admitting: Family Medicine

## 2016-11-03 DIAGNOSIS — H401132 Primary open-angle glaucoma, bilateral, moderate stage: Secondary | ICD-10-CM | POA: Diagnosis not present

## 2016-11-03 DIAGNOSIS — H1033 Unspecified acute conjunctivitis, bilateral: Secondary | ICD-10-CM | POA: Diagnosis not present

## 2016-11-09 ENCOUNTER — Telehealth: Payer: Self-pay | Admitting: Family Medicine

## 2016-11-09 NOTE — Telephone Encounter (Signed)
Pt advised.

## 2016-11-09 NOTE — Telephone Encounter (Signed)
Patient dropped of BP readings on 11/02/16:   3/10 162/75 3/12 159/89 3/13  148/82 3/14 163/84 3/15 146/76 3/16 127/71 3/17  124/63 Call patient --- continue to monitor blood pressure once weekly for the next month.  Please send those readings to me.  Blood pressure appears to have improved after last visit.  I want to confirm this good readings for the next month.

## 2016-12-30 DIAGNOSIS — L308 Other specified dermatitis: Secondary | ICD-10-CM | POA: Diagnosis not present

## 2017-01-15 ENCOUNTER — Encounter: Payer: Self-pay | Admitting: Physician Assistant

## 2017-01-15 ENCOUNTER — Ambulatory Visit (INDEPENDENT_AMBULATORY_CARE_PROVIDER_SITE_OTHER): Payer: Medicare Other | Admitting: Physician Assistant

## 2017-01-15 VITALS — BP 144/81 | HR 82 | Temp 98.7°F | Resp 16 | Ht 64.5 in | Wt 240.2 lb

## 2017-01-15 DIAGNOSIS — M25512 Pain in left shoulder: Secondary | ICD-10-CM | POA: Diagnosis not present

## 2017-01-15 DIAGNOSIS — M25511 Pain in right shoulder: Secondary | ICD-10-CM

## 2017-01-15 MED ORDER — CAPSAICIN 0.075 % EX CREA: 1.0000 "application " | TOPICAL_CREAM | Freq: Two times a day (BID) | CUTANEOUS | 0 refills | Status: DC

## 2017-01-15 NOTE — Progress Notes (Signed)
Tammy Mccullough  MRN: [id number] DOB: Jun 04, 1941  PCP: Wardell Honour, MD  Subjective:  Pt is a pleasant 76 year old female PMH HTN, chronic renal insufficiency, glaucoma who presents to clinic for b/l shoulder pain x several months. Shoulder pain has been present since early this year. She believed it was due to her carrying groceries, etc. Her pain has since worsened and is now present at night - does not seem to have any association with daily activities.   Pain at night occasionally wakes her up. Pain is achy. Stretching or moving her arms around makes it better. Pain is located on the top of her shoulders, sometimes radiates down front of arm.   "feels like a catch" in her right shoulder when she turns her arm over the passenger seat of her car when she pulls out of the drive way.  She is not taking anything for this.  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  She does water arobics 2-3 times a week.   Review of Systems  Musculoskeletal: Positive for arthralgias (b/l shoulder). Negative for joint swelling and myalgias.  Skin: Negative.     Patient Active Problem List   Diagnosis Date Noted  . 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 Glucose intolerance (impaired glucose tolerance) 04/21/2016  . Chronic renal insufficiency 09/09/2015  . Glaucoma 02/09/2013  . HTN (hypertension) 02/09/2013    Current Outpatient Prescriptions on File Prior to Visit  Medication Sig Dispense Refill  . aspirin 81 MG tablet Take 81 mg by mouth daily. Reported on 02/15/2016    . AZOPT 1 % ophthalmic suspension     . COMBIGAN 0.2-0.5 % ophthalmic solution     . valsartan-hydrochlorothiazide (DIOVAN-HCT) 320-25 MG tablet TAKE 1 TABLET BY MOUTH DAILY. 90 tablet 3  . ZIOPTAN 0.0015 % SOLN      No current facility-administered medications on file prior to visit.     No Known  Allergies   Objective:  BP (!) 144/81 (BP Location: Left Arm, Patient Position: Sitting, Cuff Size: Large)   Pulse 82   Temp 98.7 F (37.1 C) (Oral)   Resp 16   Ht 5' 4.5" (1.638 m)   Wt 240 lb 3.2 oz (109 kg)   SpO2 96%   BMI 40.59 kg/m   Physical Exam  Constitutional: She is oriented to person, place, and time and well-developed, well-nourished, and in no distress. No distress.  Cardiovascular: Normal rate, regular rhythm and normal heart sounds.   Musculoskeletal:       Right shoulder: She exhibits normal range of motion, no tenderness, no bony tenderness, no swelling, no crepitus and no deformity.       Left shoulder: She exhibits normal range of motion, no tenderness, no bony tenderness, no swelling, no effusion, no crepitus and no deformity.  Neurological: She is alert and oriented to person, place, and time. GCS score is 15.  Skin: Skin is warm and dry.  Psychiatric: Mood, memory, affect and judgment normal.  Vitals reviewed.   Assessment and Plan :  1. Pain of both shoulder joints - Ambulatory referral to Physical Therapy - capsicum (ZOSTRIX) 0.075 % topical cream; Apply 1 application topically 2 (two) times daily.  Dispense: 28.3 g; Refill: 0 - Suspect deconditioning. Will refer to physical therapy. Topical cream and Tylenol for relief at night. She agrees with  plan. RTC if symptoms worsen.   Mercer Pod, PA-C  Primary Care at [place] 01/15/2017 3:43 PM

## 2017-01-15 NOTE — Patient Instructions (Addendum)
You will receive a call in the next week to schedule an appointment with physical therapy.  Please use your cream as prescribed at night for your shoulder pain.    Thank you for coming in today. I hope you feel we met your needs.  Feel free to call [facility] if you have any questions or further requests.  Please consider signing up for MyChart if you do not already have it, as this is a great way to communicate with me.  Best,  Whitney McVey, PA-C ' IF you received an x-ray today, you will receive an invoice from [facility]. Please contact [facility] at [phone] with questions or concerns regarding your invoice.   IF you received labwork today, you will receive an invoice from [facility]. Please contact [facility] at [phone] with questions or concerns regarding your invoice.   Our billing staff will not be able to assist you with questions regarding bills from these companies.  You will be contacted with the lab results as soon as they are available. The fastest way to get your results is to activate your My Chart account. Instructions are located on the last page of this paperwork. If you have not heard from [place] regarding the results in 2 weeks, please contact this office.

## 2017-02-09 DIAGNOSIS — M25512 Pain in left shoulder: Secondary | ICD-10-CM | POA: Diagnosis not present

## 2017-02-09 DIAGNOSIS — M6281 Muscle weakness (generalized): Secondary | ICD-10-CM | POA: Diagnosis not present

## 2017-02-09 DIAGNOSIS — H401332 Pigmentary glaucoma, bilateral, moderate stage: Secondary | ICD-10-CM | POA: Diagnosis not present

## 2017-02-09 DIAGNOSIS — M25511 Pain in right shoulder: Secondary | ICD-10-CM | POA: Diagnosis not present

## 2017-02-19 DIAGNOSIS — M6281 Muscle weakness (generalized): Secondary | ICD-10-CM | POA: Diagnosis not present

## 2017-02-19 DIAGNOSIS — M25511 Pain in right shoulder: Secondary | ICD-10-CM | POA: Diagnosis not present

## 2017-02-19 DIAGNOSIS — M25512 Pain in left shoulder: Secondary | ICD-10-CM | POA: Diagnosis not present

## 2017-02-22 DIAGNOSIS — M6281 Muscle weakness (generalized): Secondary | ICD-10-CM | POA: Diagnosis not present

## 2017-02-22 DIAGNOSIS — M25511 Pain in right shoulder: Secondary | ICD-10-CM | POA: Diagnosis not present

## 2017-02-22 DIAGNOSIS — M25512 Pain in left shoulder: Secondary | ICD-10-CM | POA: Diagnosis not present

## 2017-02-24 DIAGNOSIS — M25512 Pain in left shoulder: Secondary | ICD-10-CM | POA: Diagnosis not present

## 2017-02-24 DIAGNOSIS — M25511 Pain in right shoulder: Secondary | ICD-10-CM | POA: Diagnosis not present

## 2017-02-24 DIAGNOSIS — M6281 Muscle weakness (generalized): Secondary | ICD-10-CM | POA: Diagnosis not present

## 2017-03-01 DIAGNOSIS — M25511 Pain in right shoulder: Secondary | ICD-10-CM | POA: Diagnosis not present

## 2017-03-01 DIAGNOSIS — M6281 Muscle weakness (generalized): Secondary | ICD-10-CM | POA: Diagnosis not present

## 2017-03-01 DIAGNOSIS — M25512 Pain in left shoulder: Secondary | ICD-10-CM | POA: Diagnosis not present

## 2017-03-03 DIAGNOSIS — M6281 Muscle weakness (generalized): Secondary | ICD-10-CM | POA: Diagnosis not present

## 2017-03-03 DIAGNOSIS — M25512 Pain in left shoulder: Secondary | ICD-10-CM | POA: Diagnosis not present

## 2017-03-03 DIAGNOSIS — M25511 Pain in right shoulder: Secondary | ICD-10-CM | POA: Diagnosis not present

## 2017-03-08 DIAGNOSIS — M25511 Pain in right shoulder: Secondary | ICD-10-CM | POA: Diagnosis not present

## 2017-03-08 DIAGNOSIS — M6281 Muscle weakness (generalized): Secondary | ICD-10-CM | POA: Diagnosis not present

## 2017-03-08 DIAGNOSIS — M25512 Pain in left shoulder: Secondary | ICD-10-CM | POA: Diagnosis not present

## 2017-03-10 DIAGNOSIS — M25512 Pain in left shoulder: Secondary | ICD-10-CM | POA: Diagnosis not present

## 2017-03-10 DIAGNOSIS — M6281 Muscle weakness (generalized): Secondary | ICD-10-CM | POA: Diagnosis not present

## 2017-03-10 DIAGNOSIS — M25511 Pain in right shoulder: Secondary | ICD-10-CM | POA: Diagnosis not present

## 2017-03-15 DIAGNOSIS — M6281 Muscle weakness (generalized): Secondary | ICD-10-CM | POA: Diagnosis not present

## 2017-03-15 DIAGNOSIS — M25512 Pain in left shoulder: Secondary | ICD-10-CM | POA: Diagnosis not present

## 2017-03-15 DIAGNOSIS — M25511 Pain in right shoulder: Secondary | ICD-10-CM | POA: Diagnosis not present

## 2017-03-17 DIAGNOSIS — M25512 Pain in left shoulder: Secondary | ICD-10-CM | POA: Diagnosis not present

## 2017-03-17 DIAGNOSIS — M25511 Pain in right shoulder: Secondary | ICD-10-CM | POA: Diagnosis not present

## 2017-03-17 DIAGNOSIS — M6281 Muscle weakness (generalized): Secondary | ICD-10-CM | POA: Diagnosis not present

## 2017-04-26 NOTE — Progress Notes (Signed)
Subjective:    Patient ID: Tammy Mccullough, female    DOB: Oct 09, 1940, 76 y.o.   MRN: [id number]  04/27/2017  Hypertension (6 month follow-up )   HPI This 76 y.o. female presents for evaluation of hypertension, renal insufficiency, and obesity.Patient reports good compliance with medication, good tolerance to medication, and good symptom control.   120s/70s.  Received recall on Valsartan.  Still going to water aerobics three days per week; reality is two days per week.  LEFT knee osteoarthritis: pain is horrible.  Tylenol is not working.  No orthopedist.  Last xray of LEFT knee years ago.  No previous xray of knee.  Pai nin knee for three months.  No giving out but feels like it will.  No popping.  Getting up or changing from sitting to standing.  Getting out of car is a problem.  Cannot flex with extremes.  No ice.  Taking Aleve sparingly.   Shoulder pain B: onset since last visit.  Pain at nighttime only; feels stiff; must elevate arm above head to relieve pain.  No associated neck pain.  No numbness/tingling/weakness.    BP Readings from Last 3 Encounters:  04/27/17 138/82  01/15/17 (!) 144/81  10/20/16 (!) 158/88   Wt Readings from Last 3 Encounters:  04/27/17 239 lb (108.4 kg)  01/15/17 240 lb 3.2 oz (109 kg)  10/20/16 237 lb (107.5 kg)   Immunization History  Administered Date(s) Administered  . Influenza Split 05/03/2013, 05/11/2015  . Influenza,inj,Quad PF,6+ Mos 07/25/2014, 04/21/2016, 04/27/2017  . Pneumococcal Conjugate-13 05/03/2013, 07/25/2014  . Pneumococcal-Unspecified 05/03/2013  . Td 08/17/2005  . Tdap 06/02/2016  . Zoster 10/22/2015    Review of Systems  Constitutional: Negative for chills, diaphoresis, fatigue and fever.  Eyes: Negative for visual disturbance.  Respiratory: Negative for cough and shortness of breath.   Cardiovascular: Negative for chest pain, palpitations and leg swelling.  Gastrointestinal: Negative for abdominal pain, constipation,  diarrhea, nausea and vomiting.  Endocrine: Negative for cold intolerance, heat intolerance, polydipsia, polyphagia and polyuria.  Musculoskeletal: Positive for arthralgias and gait problem. Negative for joint swelling, neck pain and neck stiffness.  Neurological: Negative for dizziness, tremors, seizures, syncope, facial asymmetry, speech difficulty, weakness, light-headedness, numbness and headaches.    Past Medical History:  Diagnosis Date  . Arthritis    B knees.  . Cataract   . Glaucoma   . Glucose intolerance (impaired glucose tolerance)   . Hypertension   . Renal insufficiency    Past Surgical History:  Procedure Laterality Date  . CATARACT EXTRACTION Right 07-2014  . CATARACT EXTRACTION Left 01/16/2016  . COLONOSCOPY     10 +yrs ago in [place], normal per pt  . VAGINAL DELIVERY     x2   No Known Allergies  Social History   Social History  . Marital status: Widowed    Spouse name: N/A  . Number of children: 2  . Years of education: N/A   Occupational History  . retired    Social History Main Topics  . Smoking status: Never Smoker  . Smokeless tobacco: Never Used  . Alcohol use 3.6 oz/week    6 Standard drinks or equivalent per week     Comment: 2-3  . Drug use: No  . Sexual activity: Not Currently   Other Topics Concern  . Not on file   Social History Narrative   Marital status: widowed since 2010 after 53 years of marriage.  Not dating but would like to be.  Children:  2 Children; no grandchildren; 2 adopted grandchildren.  One son is disabled; patient supports; son is obese.      Living: alone with 2 cats.      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      Tobacco: never      Alcohol: weekends liquor; some during the week.  3-5 cups per week.      Drugs: none      Exercise: water aerobics three days per week.  [employer].      Seatbelt: 100%;      Guns: none      ADLs:: independent with  ADLs.   Drives.      Advanced Directives: yes; healthcare POA:  Older brother [name]);  FULL CODE but no prolonged measures.     Family History  Problem Relation Age of Onset  . Diabetes Mother   . Heart disease Mother        unsure  . COPD Father   . Diabetes Sister   . Hypertension Sister   . Kidney disease Sister   . Diabetes Brother   . Diabetes Brother   . Colon cancer Neg Hx   . Rectal cancer Neg Hx   . Stomach cancer Neg Hx        Objective:    BP 138/82   Pulse 77   Temp 98 F (36.7 C) (Oral)   Resp 16   Ht 5' 3.39" (1.61 m)   Wt 239 lb (108.4 kg)   SpO2 97%   BMI 41.82 kg/m  Physical Exam  Constitutional: She is oriented to person, place, and time. She appears well-developed and well-nourished. No distress.  HENT:  Head: Normocephalic and atraumatic.  Right Ear: External ear normal.  Left Ear: External ear normal.  Nose: Nose normal.  Mouth/Throat: Oropharynx is clear and moist.  Eyes: Pupils are equal, round, and reactive to light. Conjunctivae and EOM are normal.  Neck: Normal range of motion. Neck supple. Carotid bruit is not present. No thyromegaly present.  Cardiovascular: Normal rate, regular rhythm, normal heart sounds and intact distal pulses.  Exam reveals no gallop and no friction rub.   No murmur heard. Pulmonary/Chest: Effort normal and breath sounds normal. She has no wheezes. She has no rales.  Abdominal: Soft. Bowel sounds are normal. She exhibits no distension and no mass. There is no tenderness. There is no rebound and no guarding.  Musculoskeletal:       Right shoulder: Normal. She exhibits normal range of motion, no tenderness, no bony tenderness, no pain and no spasm.       Left shoulder: Normal. She exhibits normal range of motion, no tenderness, no bony tenderness, no pain, no spasm, normal pulse and normal strength.       Left knee: She exhibits decreased range of motion. No tenderness found. No medial joint line and no lateral  joint line tenderness noted.  L KNEE: pain with extension and extreme flexion; no swelling.  McMurray's positive. B shoulders: full range of motion without pain; cross over negative; empty can negative.   Lymphadenopathy:    She has no cervical adenopathy.  Neurological: She is alert and oriented to person, place, and time. No cranial nerve deficit.  Skin: Skin is warm and dry. No rash noted. She is not diaphoretic. No erythema. No pallor.  Psychiatric: She has a normal mood and affect. Her behavior is normal.   Results for  orders placed or performed in visit on 04/27/17  POCT urinalysis dipstick  Result Value Ref Range   Color, UA yellow yellow   Clarity, UA clear clear   Glucose, UA negative negative mg/dL   Bilirubin, UA negative negative   Ketones, POC UA negative negative mg/dL   Spec Grav, UA 1.020 1.010 - 1.025   Blood, UA negative negative   pH, UA 5.0 5.0 - 8.0   Protein Ur, POC negative negative mg/dL   Urobilinogen, UA 0.2 0.2 or 1.0 E.U./dL   Nitrite, UA Negative Negative   Leukocytes, UA Negative Negative   No results found. Depression screen [facility] 2/9 04/27/2017 01/15/2017 10/20/2016 07/14/2016 06/12/2016  Decreased Interest 0 0 0 0 0  Down, Depressed, Hopeless 0 0 0 0 0  PHQ - 2 Score 0 0 0 0 0   Fall Risk  04/27/2017 01/15/2017 10/20/2016 07/14/2016 06/12/2016  Falls in the past year? No No No No No  Comment - - - - -        Assessment & Plan:   1. Essential hypertension   2. Glucose intolerance (impaired glucose tolerance)   3. Chronic renal impairment, stage 1   4. 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 Need for prophylactic vaccination and inoculation against influenza   6. Pain of both shoulder joints   7. Acute pain of left knee   8. Need for shingles vaccine    -Controlled blood pressure; switch Valsartan to Olmesartan/HCTZ 40/25mg  daily due to recall.  Obtain labs. -new  onset B shoulder pain; obtain shoulder film; recommend xrays of shoulders; home exercise program provided. -new onset LEFT knee pain; obtain knee xray; home exercise program provided; rx for Mobic provided.   Orders Placed This Encounter  Procedures  . DG Knee Complete 4 Views Left    Standing Status:   Future    Standing Expiration Date:   04/27/2018    Order Specific Question:   Reason for Exam (SYMPTOM  OR DIAGNOSIS REQUIRED)    Answer:   R shoulder pain at nighttime.    Order Specific Question:   Preferred imaging location?    Answer:   External  . DG Shoulder Right    Standing Status:   Future    Standing Expiration Date:   04/27/2018    Order Specific Question:   Reason for Exam (SYMPTOM  OR DIAGNOSIS REQUIRED)    Answer:   R shoulder pain at nighttime.    Order Specific Question:   Preferred imaging location?    Answer:   External  . Flu Vaccine QUAD 36+ mos IM  . CBC with Differential/Platelet  . Comprehensive metabolic panel  . POCT urinalysis dipstick   Meds ordered this encounter  Medications  . olmesartan-hydrochlorothiazide (BENICAR HCT) 40-25 MG tablet    Sig: Take 1 tablet by mouth daily.    Dispense:  90 tablet    Refill:  1  . Zoster Vac Recomb Adjuvanted [facility]) injection    Sig: Inject 0.5 mLs into the muscle once.    Dispense:  0.5 mL    Refill:  1  . meloxicam (MOBIC) 7.5 MG tablet    Sig: Take 1 tablet (7.5 mg total) by mouth daily.    Dispense:  30 tablet    Refill:  0    Return in about 6 months (around 10/25/2017) for complete physical examiniation.  Namita Yearwood Elayne Guerin, M.D. Primary Care at [facility] previously [facility] [place] [address] [place], [place]  [address] [phone] phone [phone] fax

## 2017-04-27 ENCOUNTER — Encounter: Payer: Self-pay | Admitting: Family Medicine

## 2017-04-27 ENCOUNTER — Ambulatory Visit (INDEPENDENT_AMBULATORY_CARE_PROVIDER_SITE_OTHER): Payer: Medicare Other

## 2017-04-27 ENCOUNTER — Ambulatory Visit (INDEPENDENT_AMBULATORY_CARE_PROVIDER_SITE_OTHER): Payer: Medicare Other | Admitting: Family Medicine

## 2017-04-27 VITALS — BP 138/82 | HR 77 | Temp 98.0°F | Resp 16 | Ht 63.39 in | Wt 239.0 lb

## 2017-04-27 DIAGNOSIS — Z23 Encounter for immunization: Secondary | ICD-10-CM | POA: Diagnosis not present

## 2017-04-27 DIAGNOSIS — R7302 Impaired glucose tolerance (oral): Secondary | ICD-10-CM | POA: Diagnosis not present

## 2017-04-27 DIAGNOSIS — Z6841 Body Mass Index (BMI) 40.0 and over, adult: Secondary | ICD-10-CM

## 2017-04-27 DIAGNOSIS — M25511 Pain in right shoulder: Secondary | ICD-10-CM

## 2017-04-27 DIAGNOSIS — E6609 Other obesity due to excess calories: Secondary | ICD-10-CM

## 2017-04-27 DIAGNOSIS — M25512 Pain in left shoulder: Secondary | ICD-10-CM | POA: Diagnosis not present

## 2017-04-27 DIAGNOSIS — I10 Essential (primary) hypertension: Secondary | ICD-10-CM | POA: Diagnosis not present

## 2017-04-27 DIAGNOSIS — M179 Osteoarthritis of knee, unspecified: Secondary | ICD-10-CM | POA: Diagnosis not present

## 2017-04-27 DIAGNOSIS — M25562 Pain in left knee: Secondary | ICD-10-CM

## 2017-04-27 DIAGNOSIS — N181 Chronic kidney disease, stage 1: Secondary | ICD-10-CM

## 2017-04-27 DIAGNOSIS — IMO0001 Reserved for inherently not codable concepts without codable children: Secondary | ICD-10-CM

## 2017-04-27 LAB — POCT URINALYSIS DIP (MANUAL ENTRY)
Bilirubin, UA: NEGATIVE
Glucose, UA: NEGATIVE mg/dL
Ketones, POC UA: NEGATIVE mg/dL
LEUKOCYTES UA: NEGATIVE
Nitrite, UA: NEGATIVE
PH UA: 5 (ref 5.0–8.0)
PROTEIN UA: NEGATIVE mg/dL
RBC UA: NEGATIVE
Spec Grav, UA: 1.02 (ref 1.010–1.025)
Urobilinogen, UA: 0.2 E.U./dL

## 2017-04-27 MED ORDER — ZOSTER VAC RECOMB ADJUVANTED 50 MCG/0.5ML IM SUSR: 0.5000 mL | Freq: Once | INTRAMUSCULAR | 1 refills | Status: AC

## 2017-04-27 MED ORDER — OLMESARTAN MEDOXOMIL-HCTZ 40-25 MG PO TABS: 1.0000 | ORAL_TABLET | Freq: Every day | ORAL | 1 refills | Status: DC

## 2017-04-27 MED ORDER — MELOXICAM 7.5 MG PO TABS: 7.5000 mg | ORAL_TABLET | Freq: Every day | ORAL | 0 refills | Status: DC

## 2017-04-27 NOTE — Patient Instructions (Addendum)
IF you received an x-ray today, you will receive an invoice from [facility]. Please contact [facility] at [phone] with questions or concerns regarding your invoice.   IF you received labwork today, you will receive an invoice from [place]. Please contact [facility] at [phone] with questions or concerns regarding your invoice.   Our billing staff will not be able to assist you with questions regarding bills from these companies.  You will be contacted with the lab results as soon as they are available. The fastest way to get your results is to activate your My Chart account. Instructions are located on the last page of this paperwork. If you have not heard from [place] regarding the results in 2 weeks, please contact this office.     kn Knee Exercises Ask your health care provider which exercises are safe for you. Do exercises exactly as told by your health care provider and adjust them as directed. It is normal to feel mild stretching, pulling, tightness, or discomfort as you do these exercises, but you should stop right away if you feel sudden pain or your pain gets worse.Do not begin these exercises until told by your health care provider. STRETCHING AND RANGE OF MOTION EXERCISES These exercises warm up your muscles and joints and improve the movement and flexibility of your knee. These exercises also help to relieve pain, numbness, and tingling. Exercise A: Knee Extension, Prone 1. Lie on your abdomen on a bed. 2. Place your left / right knee just beyond the edge of the surface so your knee is not on the bed. You can put a towel under your left / right thigh just above your knee for comfort. 3. Relax your leg muscles and allow gravity to straighten your knee. You should feel a stretch behind your left / right knee. 4. Hold this position for __________ seconds. 5. Scoot up so your knee is supported between repetitions. Repeat __________ times. Complete this  stretch __________ times a day. Exercise B: Knee Flexion, Active  1. Lie on your back with both knees straight. If this causes back discomfort, bend your left / right knee so your foot is flat on the floor. 2. Slowly slide your left / right heel back toward your buttocks until you feel a gentle stretch in the front of your knee or thigh. 3. Hold this position for __________ seconds. 4. Slowly slide your left / right heel back to the starting position. Repeat __________ times. Complete this exercise __________ times a day. Exercise C: Quadriceps, Prone  1. Lie on your abdomen on a firm surface, such as a bed or padded floor. 2. Bend your left / right knee and hold your ankle. If you cannot reach your ankle or pant leg, loop a belt around your foot and grab the belt instead. 3. Gently pull your heel toward your buttocks. Your knee should not slide out to the side. You should feel a stretch in the front of your thigh and knee. 4. Hold this position for __________ seconds. Repeat __________ times. Complete this stretch __________ times a day. Exercise D: Hamstring, Supine 1. Lie on your back. 2. Loop a belt or towel over the ball of your left / right foot. The ball of your foot is on the walking surface, right under your toes. 3. Straighten your left / right knee and slowly pull on the belt to raise your leg until you feel a gentle stretch behind your knee. ? Do not let  your left / right knee bend while you do this. ? Keep your other leg flat on the floor. 4. Hold this position for __________ seconds. Repeat __________ times. Complete this stretch __________ times a day. STRENGTHENING EXERCISES These exercises build strength and endurance in your knee. Endurance is the ability to use your muscles for a long time, even after they get tired. Exercise E: Quadriceps, Isometric  1. Lie on your back with your left / right leg extended and your other knee bent. Put a rolled towel or small pillow  under your knee if told by your health care provider. 2. Slowly tense the muscles in the front of your left / right thigh. You should see your kneecap slide up toward your hip or see increased dimpling just above the knee. This motion will push the back of the knee toward the floor. 3. For __________ seconds, keep the muscle as tight as you can without increasing your pain. 4. Relax the muscles slowly and completely. Repeat __________ times. Complete this exercise __________ times a day. Exercise F: Straight Leg Raises - Quadriceps 1. Lie on your back with your left / right leg extended and your other knee bent. 2. Tense the muscles in the front of your left / right thigh. You should see your kneecap slide up or see increased dimpling just above the knee. Your thigh may even shake a bit. 3. Keep these muscles tight as you raise your leg 4-6 inches (10-15 cm) off the floor. Do not let your knee bend. 4. Hold this position for __________ seconds. 5. Keep these muscles tense as you lower your leg. 6. Relax your muscles slowly and completely after each repetition. Repeat __________ times. Complete this exercise __________ times a day. Exercise G: Hamstring, Isometric 1. Lie on your back on a firm surface. 2. Bend your left / right knee approximately __________ degrees. 3. Dig your left / right heel into the surface as if you are trying to pull it toward your buttocks. Tighten the muscles in the back of your thighs to dig as hard as you can without increasing any pain. 4. Hold this position for __________ seconds. 5. Release the tension gradually and allow your muscles to relax completely for __________ seconds after each repetition. Repeat __________ times. Complete this exercise __________ times a day. Exercise H: Hamstring Curls  If told by your health care provider, do this exercise while wearing ankle weights. Begin with __________ weights. Then increase the weight by 1 lb (0.5 kg) increments.  Do not wear ankle weights that are more than __________. 1. Lie on your abdomen with your legs straight. 2. Bend your left / right knee as far as you can without feeling pain. Keep your hips flat against the floor. 3. Hold this position for __________ seconds. 4. Slowly lower your leg to the starting position.  Repeat __________ times. Complete this exercise __________ times a day. Exercise I: Squats (Quadriceps) 1. Stand in front of a table, with your feet and knees pointing straight ahead. You may rest your hands on the table for balance but not for support. 2. Slowly bend your knees and lower your hips like you are going to sit in a chair. ? Keep your weight over your heels, not over your toes. ? Keep your lower legs upright so they are parallel with the table legs. ? Do not let your hips go lower than your knees. ? Do not bend lower than told by your health care provider. ?  If your knee pain increases, do not bend as low. 3. Hold the squat position for __________ seconds. 4. Slowly push with your legs to return to standing. Do not use your hands to pull yourself to standing. Repeat __________ times. Complete this exercise __________ times a day. Exercise J: Wall Slides (Quadriceps)  1. Lean your back against a smooth wall or door while you walk your feet out 18-24 inches (46-61 cm) from it. 2. Place your feet hip-width apart. 3. Slowly slide down the wall or door until your knees bend __________ degrees. Keep your knees over your heels, not over your toes. Keep your knees in line with your hips. 4. Hold for __________ seconds. Repeat __________ times. Complete this exercise __________ times a day. Exercise K: Straight Leg Raises - Hip Abductors 1. Lie on your side with your left / right leg in the top position. Lie so your head, shoulder, knee, and hip line up. You may bend your bottom knee to help you keep your balance. 2. Roll your hips slightly forward so your hips are stacked  directly over each other and your left / right knee is facing forward. 3. Leading with your heel, lift your top leg 4-6 inches (10-15 cm). You should feel the muscles in your outer hip lifting. ? Do not let your foot drift forward. ? Do not let your knee roll toward the ceiling. 4. Hold this position for __________ seconds. 5. Slowly return your leg to the starting position. 6. Let your muscles relax completely after each repetition. Repeat __________ times. Complete this exercise __________ times a day. Exercise L: Straight Leg Raises - Hip Extensors 1. Lie on your abdomen on a firm surface. You can put a pillow under your hips if that is more comfortable. 2. Tense the muscles in your buttocks and lift your left / right leg about 4-6 inches (10-15 cm). Keep your knee straight as you lift your leg. 3. Hold this position for __________ seconds. 4. Slowly lower your leg to the starting position. 5. Let your leg relax completely after each repetition. Repeat __________ times. Complete this exercise __________ times a day. This information is not intended to replace advice given to you by your health care provider. Make sure you discuss any questions you have with your health care provider. Document Released: 06/17/2005 Document Revised: 04/27/2016 Document Reviewed: 06/09/2015 Elsevier Interactive Patient Education  2018 [employer].

## 2017-04-28 LAB — COMPREHENSIVE METABOLIC PANEL
ALBUMIN: 4.1 g/dL (ref 3.5–4.8)
ALK PHOS: 62 IU/L (ref 39–117)
ALT: 9 IU/L (ref 0–32)
AST: 20 IU/L (ref 0–40)
Albumin/Globulin Ratio: 1.4 (ref 1.2–2.2)
BILIRUBIN TOTAL: 0.8 mg/dL (ref 0.0–1.2)
BUN / CREAT RATIO: 22 (ref 12–28)
BUN: 28 mg/dL — AB (ref 8–27)
CHLORIDE: 101 mmol/L (ref 96–106)
CO2: 20 mmol/L (ref 20–29)
Calcium: 9.3 mg/dL (ref 8.7–10.3)
Creatinine, Ser: 1.25 mg/dL — ABNORMAL HIGH (ref 0.57–1.00)
GFR calc Af Amer: 48 mL/min/{1.73_m2} — ABNORMAL LOW (ref >59)
GFR calc non Af Amer: 41 mL/min/{1.73_m2} — ABNORMAL LOW (ref >59)
GLUCOSE: 118 mg/dL — AB (ref 65–99)
Globulin, Total: 2.9 g/dL (ref 1.5–4.5)
Potassium: 3.7 mmol/L (ref 3.5–5.2)
Sodium: 143 mmol/L (ref 134–144)
Total Protein: 7 g/dL (ref 6.0–8.5)

## 2017-04-28 LAB — CBC WITH DIFFERENTIAL/PLATELET
BASOS ABS: 0 10*3/uL (ref 0.0–0.2)
BASOS: 0 %
EOS (ABSOLUTE): 0.3 10*3/uL (ref 0.0–0.4)
EOS: 7 %
Hematocrit: 35.7 % (ref 34.0–46.6)
Hemoglobin: 11.7 g/dL (ref 11.1–15.9)
Immature Grans (Abs): 0 10*3/uL (ref 0.0–0.1)
Immature Granulocytes: 0 %
LYMPHS: 33 %
Lymphocytes Absolute: 1.7 10*3/uL (ref 0.7–3.1)
MCH: 31 pg (ref 26.6–33.0)
MCHC: 32.8 g/dL (ref 31.5–35.7)
MCV: 94 fL (ref 79–97)
MONOCYTES: 7 %
MONOS ABS: 0.4 10*3/uL (ref 0.1–0.9)
NEUTROS ABS: 2.7 10*3/uL (ref 1.4–7.0)
NEUTROS PCT: 53 %
PLATELETS: 230 10*3/uL (ref 150–379)
RBC: 3.78 x10E6/uL (ref 3.77–5.28)
RDW: 13.9 % (ref 12.3–15.4)
WBC: 5.1 10*3/uL (ref 3.4–10.8)

## 2017-05-01 ENCOUNTER — Encounter: Payer: Self-pay | Admitting: Family Medicine

## 2017-05-30 ENCOUNTER — Other Ambulatory Visit: Payer: Self-pay | Admitting: Family Medicine

## 2017-07-01 DIAGNOSIS — H401332 Pigmentary glaucoma, bilateral, moderate stage: Secondary | ICD-10-CM | POA: Diagnosis not present

## 2017-08-27 ENCOUNTER — Other Ambulatory Visit: Payer: Self-pay | Admitting: Family Medicine

## 2017-08-27 NOTE — Telephone Encounter (Signed)
This is not a [employer] patient. Was not sure who to route to so I routed to PCP.

## 2017-08-27 NOTE — Telephone Encounter (Signed)
Last seen by Reginia Forts 04/27/17; pt does have history of renal disease; can this be refilled?

## 2017-09-02 ENCOUNTER — Other Ambulatory Visit: Payer: Self-pay | Admitting: Family Medicine

## 2017-09-02 DIAGNOSIS — Z1231 Encounter for screening mammogram for malignant neoplasm of breast: Secondary | ICD-10-CM

## 2017-09-30 ENCOUNTER — Ambulatory Visit
Admission: RE | Admit: 2017-09-30 | Discharge: 2017-09-30 | Disposition: A | Discharge status: Home / Self Care | Payer: Medicare Other | Source: Ambulatory Visit | Attending: Family Medicine | Admitting: Family Medicine

## 2017-09-30 DIAGNOSIS — Z1231 Encounter for screening mammogram for malignant neoplasm of breast: Secondary | ICD-10-CM | POA: Diagnosis not present

## 2017-10-25 ENCOUNTER — Other Ambulatory Visit: Payer: Self-pay

## 2017-10-25 ENCOUNTER — Encounter: Payer: Self-pay | Admitting: Family Medicine

## 2017-10-25 ENCOUNTER — Ambulatory Visit (INDEPENDENT_AMBULATORY_CARE_PROVIDER_SITE_OTHER): Payer: Medicare Other | Admitting: Family Medicine

## 2017-10-25 VITALS — BP 142/82 | HR 81 | Temp 98.5°F | Resp 16 | Ht 63.98 in | Wt 226.0 lb

## 2017-10-25 DIAGNOSIS — Z6841 Body Mass Index (BMI) 40.0 and over, adult: Secondary | ICD-10-CM | POA: Diagnosis not present

## 2017-10-25 DIAGNOSIS — E7800 Pure hypercholesterolemia, unspecified: Secondary | ICD-10-CM | POA: Diagnosis not present

## 2017-10-25 DIAGNOSIS — R739 Hyperglycemia, unspecified: Secondary | ICD-10-CM

## 2017-10-25 DIAGNOSIS — N181 Chronic kidney disease, stage 1: Secondary | ICD-10-CM | POA: Diagnosis not present

## 2017-10-25 DIAGNOSIS — Z0000 Encounter for general adult medical examination without abnormal findings: Secondary | ICD-10-CM

## 2017-10-25 DIAGNOSIS — I10 Essential (primary) hypertension: Secondary | ICD-10-CM

## 2017-10-25 MED ORDER — OLMESARTAN MEDOXOMIL-HCTZ 40-25 MG PO TABS: 1.0000 | ORAL_TABLET | Freq: Every day | ORAL | 1 refills | Status: DC

## 2017-10-25 NOTE — Progress Notes (Signed)
Subjective:    Patient ID: Tammy Mccullough, female    DOB: [birth year], 77 y.o.   MRN: [id number]  10/25/2017  Medicare Wellness; Hypertension; and Osteoarthritis    HPI This 77 y.o. female presents for [place] and six month follow-up of hypertension, hypercholesterolemia, glucose intolerance, renal insufficiency.  Last physical:  10-20-2016 Pap smear:  N/a due to age Mammogram: 09-2017 Colonoscopy:  2016 Bone density: 2016 WNL Eye exam:  Every six months; go every six month rotation. Dental exam:  Every six months.  Management changes made at last visit include the following: -Controlled blood pressure; switch Valsartan to Olmesartan/HCTZ 40/25mg  daily due to recall.  Obtain labs. -new onset B shoulder pain; obtain shoulder film; recommend xrays of shoulders; home exercise program provided. -new onset LEFT knee pain; obtain knee xray; home exercise program provided; rx for Mobic provided. Lab results are as follows: -sugar/glucose is slightly elevated at 118. -creatinine is slightly elevated at 1.25 but is stable for the past three years. Creatinine represents your kidney function. I would recommend limiting the use of Ibuprofen/Advil/Motrin/Aleve as these type medications break down through the kidneys; if used frequently or daily, these medications can cause kidney damage. I recommend using Tylenol as needed for pain or fevers. Letter.  HTN: home BP 140/80-130s-150s. B shoulder pain resolved. L knee stiffness in am; minimal pain. Water aerobic 1-2 times per week.   Has lost 13 pounds; has been intentionally cutting back. Brother, Darnelle Maffucci [place] died last month of CVA. Upcoming trip to [place] with large group. 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              BP Readings from Last 3 Encounters:  10/25/17 (!) 142/82  04/27/17 138/82  01/15/17 (!) 144/81   Wt Readings from Last 3 Encounters:  10/25/17 226 lb (102.5 kg)  04/27/17 239 lb  (108.4 kg)  01/15/17 240 lb 3.2 oz (109 kg)   Immunization History  Administered Date(s) Administered  . Influenza Split 05/03/2013, 05/11/2015  . Influenza,inj,Quad PF,6+ Mos 07/25/2014, 04/21/2016, 04/27/2017  . Pneumococcal Conjugate-13 05/03/2013, 07/25/2014  . Pneumococcal-Unspecified 05/03/2013  . Td 08/17/2005  . Tdap 06/02/2016  . Zoster 10/22/2015  . Zoster Recombinat (Shingrix) 01/15/2017, 06/17/2017    Review of Systems  Constitutional: Negative for activity change, appetite change, chills, diaphoresis, fatigue, fever and unexpected weight change.  HENT: Negative for congestion, dental problem, drooling, ear discharge, ear pain, facial swelling, hearing loss, mouth sores, nosebleeds, postnasal drip, rhinorrhea, sinus pressure, sneezing, sore throat, tinnitus, trouble swallowing and voice change.   Eyes: Negative for photophobia, pain, discharge, redness, itching and visual disturbance.  Respiratory: Negative for apnea, cough, choking, chest tightness, shortness of breath, wheezing and stridor.   Cardiovascular: Negative for chest pain, palpitations and leg swelling.  Gastrointestinal: Negative for abdominal distention, abdominal pain, anal bleeding, blood in stool, constipation, diarrhea, nausea, rectal pain and vomiting.  Endocrine: Negative for cold intolerance, heat intolerance, polydipsia, polyphagia and polyuria.  Genitourinary: Negative for decreased urine volume, difficulty urinating, dyspareunia, dysuria, enuresis, flank pain, frequency, genital sores, hematuria, menstrual problem, pelvic pain, urgency, vaginal bleeding, vaginal discharge and vaginal pain.       Nocturia x 2-3.  No urinary incontinence.  Musculoskeletal: Negative for arthralgias, back pain, gait problem, joint swelling, myalgias, neck pain and neck stiffness.  Skin: Negative for color change, pallor, rash and wound.  Allergic/Immunologic: Negative for environmental allergies, food allergies and  immunocompromised state.  Neurological: Negative for dizziness, tremors, seizures, syncope,  facial asymmetry, speech difficulty, weakness, light-headedness, numbness and headaches.  Hematological: Negative for adenopathy. Does not bruise/bleed easily.  Psychiatric/Behavioral: Negative for agitation, behavioral problems, confusion, decreased concentration, dysphoric mood, hallucinations, self-injury, sleep disturbance and suicidal ideas. The patient is not nervous/anxious and is not hyperactive.     Past Medical History:  Diagnosis Date  . Arthritis    B knees.  . Cataract   . Glaucoma   . Glucose intolerance (impaired glucose tolerance)   . Hypertension   . Renal insufficiency    Past Surgical History:  Procedure Laterality Date  . CATARACT EXTRACTION Right 07-2014  . CATARACT EXTRACTION Left 01/16/2016  . COLONOSCOPY     10 +yrs ago in [place], normal per pt  . VAGINAL DELIVERY     x2   No Known Allergies Current Outpatient Medications on File Prior to Visit  Medication Sig Dispense Refill  . aspirin 81 MG tablet Take 81 mg by mouth daily. Reported on 02/15/2016    . AZOPT 1 % ophthalmic suspension     . capsicum (ZOSTRIX) 0.075 % topical cream Apply 1 application topically 2 (two) times daily. 28.3 g 0  . COMBIGAN 0.2-0.5 % ophthalmic solution     . meloxicam (MOBIC) 7.5 MG tablet TAKE 1 TABLET(7.5 MG) BY MOUTH DAILY 90 tablet 0  . ZIOPTAN 0.0015 % SOLN      No current facility-administered medications on file prior to visit.    Social History   Socioeconomic History  . Marital status: Widowed    Spouse name: Not on file  . Number of children: 2  . Years of education: Not on file  . Highest education level: Not on file  Social Needs  . Financial resource strain: Not on file  . Food insecurity - worry: Not on file  . Food insecurity - inability: Not on file  . Transportation needs - medical: Not on file  . Transportation needs - non-medical: Not on file  Occupational  History  . Occupation: retired  Tobacco Use  . Smoking status: Never Smoker  . Smokeless tobacco: Never Used  Substance and Sexual Activity  . Alcohol use: Yes    Alcohol/week: 3.6 oz    Types: 6 Standard drinks or equivalent per week    Comment: 2-3  . Drug use: No  . Sexual activity: Not Currently  Other Topics Concern  . Not on file  Social History Narrative   Marital status: widowed since 2010 after 54 years of marriage.  Not dating but would like to be.      Children:  2 Children; no grandchildren; 2 adopted grandchildren.  One son is disabled; patient supports; son is obese.      Living: alone with 1 cat.      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      Tobacco: never      Alcohol: weekends liquor; some during the week.  3-5 cups per week.      Drugs: none      Exercise: water aerobics three days per week.  [employer].      Seatbelt: 100%;      Guns: none      ADLs:: independent with ADLs.   Drives. No assistant devices.      Advanced Directives: yes; healthcare POA:  Older brother [name]);  FULL CODE but no prolonged measures.     Family History  Problem Relation Age of Onset  .  Diabetes Mother   . Heart disease Mother        unsure  . COPD Father   . Diabetes Sister   . Hypertension Sister   . Kidney disease Sister   . Diabetes Brother   . Diabetes Brother   . Colon cancer Neg Hx   . Rectal cancer Neg Hx   . Stomach cancer Neg Hx        Objective:    BP (!) 142/82   Pulse 81   Temp 98.5 F (36.9 C) (Oral)   Resp 16   Ht 5' 3.98" (1.625 m)   Wt 226 lb (102.5 kg)   SpO2 96%   BMI 38.82 kg/m  Physical Exam  Constitutional: She is oriented to person, place, and time. She appears well-developed and well-nourished. No distress.  HENT:  Head: Normocephalic and atraumatic.  Right Ear: Hearing, tympanic membrane, external ear and ear canal normal.  Left Ear: Hearing, tympanic  membrane, external ear and ear canal normal.  Nose: Nose normal.  Mouth/Throat: Oropharynx is clear and moist.  Eyes: Conjunctivae and EOM are normal. Pupils are equal, round, and reactive to light.  Neck: Normal range of motion and full passive range of motion without pain. Neck supple. No JVD present. Carotid bruit is not present. No thyromegaly present.  Cardiovascular: Normal rate, regular rhythm, normal heart sounds and intact distal pulses. Exam reveals no gallop and no friction rub.  No murmur heard. Pulmonary/Chest: Effort normal and breath sounds normal. No respiratory distress. She has no wheezes. She has no rales. Right breast exhibits no inverted nipple, no mass, no nipple discharge, no skin change and no tenderness. Left breast exhibits no inverted nipple, no mass, no nipple discharge, no skin change and no tenderness. Breasts are symmetrical.  Abdominal: Soft. Bowel sounds are normal. She exhibits no distension and no mass. There is no tenderness. There is no rebound and no guarding.  Musculoskeletal:       Right shoulder: Normal.       Left shoulder: Normal.       Cervical back: Normal.  Lymphadenopathy:    She has no cervical adenopathy.  Neurological: She is alert and oriented to person, place, and time. She has normal reflexes. No cranial nerve deficit. She exhibits normal muscle tone. Coordination normal.  Skin: Skin is warm and dry. No rash noted. She is not diaphoretic. No erythema. No pallor.  Psychiatric: She has a normal mood and affect. Her behavior is normal. Judgment and thought content normal.  Nursing note and vitals reviewed.  No results found. Depression screen [facility] 2/9 10/25/2017 04/27/2017 01/15/2017 10/20/2016 07/14/2016  Decreased Interest 0 0 0 0 0  Down, Depressed, Hopeless 0 0 0 0 0  PHQ - 2 Score 0 0 0 0 0   Fall Risk  10/25/2017 04/27/2017 01/15/2017 10/20/2016 07/14/2016  Falls in the past year? No No No No No  Comment - - - - -    Functional Status  Survey: Is the patient deaf or have difficulty hearing?: No Does the patient have difficulty seeing, even when wearing glasses/contacts?: No Does the patient have difficulty concentrating, remembering, or making decisions?: No Does the patient have difficulty walking or climbing stairs?: No Does the patient have difficulty dressing or bathing?: No Does the patient have difficulty doing errands alone such as visiting a doctor's office or shopping?: No     Assessment & Plan:   1. Medicare annual wellness visit, subsequent   2. Essential hypertension  3. Chronic renal impairment, stage 1   4. Pure hypercholesterolemia   5. Hyperglycemia   6. Body mass index (BMI) of 40.0-44.9 in adult [facility]) Chronic    -anticipatory guidance provided --- exercise, weight loss, safe driving practices, aspirin 81mg  daily. -obtain age appropriate screening labs and labs for chronic disease management. -moderate fall risk; no evidence of depression; no evidence of hearing loss.  Discussed advanced directives and living will; also discussed end of life issues including code status.  -No changes in medication. -Obesity: congratulations on weight loss!  Encourage ongoing weight loss and increased exercise.    Orders Placed This Encounter  Procedures  . Comprehensive metabolic panel  . Lipid panel  . Urinalysis, dipstick only  . CBC with Differential/Platelet  . Hemoglobin A1c  . EKG 12-Lead   Meds ordered this encounter  Medications  . olmesartan-hydrochlorothiazide (BENICAR HCT) 40-25 MG tablet    Sig: Take 1 tablet by mouth daily.    Dispense:  90 tablet    Refill:  1    Return in about 6 months (around 04/27/2018) for follow-up chronic medical conditions.   Payeton Germani Elayne Guerin, M.D. Primary Care at [facility] [address] [place], [place]  [address] [phone] phone [phone] fax

## 2017-10-25 NOTE — Patient Instructions (Signed)
   IF you received an x-ray today, you will receive an invoice from [facility]. Please contact  Radiology at [phone] with questions or concerns regarding your invoice.   IF you received labwork today, you will receive an invoice from [facility]. Please contact [facility] at [phone] with questions or concerns regarding your invoice.   Our billing staff will not be able to assist you with questions regarding bills from these companies.  You will be contacted with the lab results as soon as they are available. The fastest way to get your results is to activate your My Chart account. Instructions are located on the last page of this paperwork. If you have not heard from us regarding the results in 2 weeks, please contact this office.      Preventive Care 79 Years and Older, Female Preventive care refers to lifestyle choices and visits with your health care provider that can promote health and wellness. What does preventive care include?  A yearly physical exam. This is also called an annual well check.  Dental exams once or twice a year.  Routine eye exams. Ask your health care provider how often you should have your eyes checked.  Personal lifestyle choices, including: ? Daily care of your teeth and gums. ? Regular physical activity. ? Eating a healthy diet. ? Avoiding tobacco and drug use. ? Limiting alcohol use. ? Practicing safe sex. ? Taking low-dose aspirin every day. ? Taking vitamin and mineral supplements as recommended by your health care provider. What happens during an annual well check? The services and screenings done by your health care provider during your annual well check will depend on your age, overall health, lifestyle risk factors, and family history of disease. Counseling Your health care provider may ask you questions about your:  Alcohol use.  Tobacco use.  Drug use.  Emotional well-being.  Home and relationship  well-being.  Sexual activity.  Eating habits.  History of falls.  Memory and ability to understand (cognition).  Work and work environment.  Reproductive health.  Screening You may have the following tests or measurements:  Height, weight, and BMI.  Blood pressure.  Lipid and cholesterol levels. These may be checked every 5 years, or more frequently if you are over 50 years old.  Skin check.  Lung cancer screening. You may have this screening every year starting at age 55 if you have a 30-pack-year history of smoking and currently smoke or have quit within the past 15 years.  Fecal occult blood test (FOBT) of the stool. You may have this test every year starting at age 50.  Flexible sigmoidoscopy or colonoscopy. You may have a sigmoidoscopy every 5 years or a colonoscopy every 10 years starting at age 50.  Hepatitis C blood test.  Hepatitis B blood test.  Sexually transmitted disease (STD) testing.  Diabetes screening. This is done by checking your blood sugar (glucose) after you have not eaten for a while (fasting). You may have this done every 1-3 years.  Bone density scan. This is done to screen for osteoporosis. You may have this done starting at age 65.  Mammogram. This may be done every 1-2 years. Talk to your health care provider about how often you should have regular mammograms.  Talk with your health care provider about your test results, treatment options, and if necessary, the need for more tests. Vaccines Your health care provider may recommend certain vaccines, such as:  Influenza vaccine. This is recommended every year.    Tetanus, diphtheria, and acellular pertussis (Tdap, Td) vaccine. You may need a Td booster every 10 years.  Varicella vaccine. You may need this if you have not been vaccinated.  Zoster vaccine. You may need this after age 60.  Measles, mumps, and rubella (MMR) vaccine. You may need at least one dose of MMR if you were born in  1959 or later. You may also need a second dose.  Pneumococcal 13-valent conjugate (PCV13) vaccine. One dose is recommended after age 65.  Pneumococcal polysaccharide (PPSV23) vaccine. One dose is recommended after age 65.  Meningococcal vaccine. You may need this if you have certain conditions.  Hepatitis A vaccine. You may need this if you have certain conditions or if you travel or work in places where you may be exposed to hepatitis A.  Hepatitis B vaccine. You may need this if you have certain conditions or if you travel or work in places where you may be exposed to hepatitis B.  Haemophilus influenzae type b (Hib) vaccine. You may need this if you have certain conditions.  Talk to your health care provider about which screenings and vaccines you need and how often you need them. This information is not intended to replace advice given to you by your health care provider. Make sure you discuss any questions you have with your health care provider. Document Released: 08/30/2015 Document Revised: 04/22/2016 Document Reviewed: 06/04/2015 Elsevier Interactive Patient Education  2018 Elsevier Inc.  

## 2017-10-26 LAB — URINALYSIS, DIPSTICK ONLY
BILIRUBIN UA: NEGATIVE
Glucose, UA: NEGATIVE
Ketones, UA: NEGATIVE
Leukocytes, UA: NEGATIVE
NITRITE UA: NEGATIVE
PROTEIN UA: NEGATIVE
RBC UA: NEGATIVE
SPEC GRAV UA: 1.017 (ref 1.005–1.030)
Urobilinogen, Ur: 0.2 mg/dL (ref 0.2–1.0)
pH, UA: 5.5 (ref 5.0–7.5)

## 2017-10-26 LAB — COMPREHENSIVE METABOLIC PANEL
A/G RATIO: 1.4 (ref 1.2–2.2)
ALT: 14 IU/L (ref 0–32)
AST: 23 IU/L (ref 0–40)
Albumin: 4.1 g/dL (ref 3.5–4.8)
Alkaline Phosphatase: 59 IU/L (ref 39–117)
BUN/Creatinine Ratio: 23 (ref 12–28)
BUN: 36 mg/dL — ABNORMAL HIGH (ref 8–27)
Bilirubin Total: 1.1 mg/dL (ref 0.0–1.2)
CALCIUM: 9.4 mg/dL (ref 8.7–10.3)
CHLORIDE: 105 mmol/L (ref 96–106)
CO2: 21 mmol/L (ref 20–29)
Creatinine, Ser: 1.6 mg/dL — ABNORMAL HIGH (ref 0.57–1.00)
GFR calc Af Amer: 35 mL/min/{1.73_m2} — ABNORMAL LOW (ref >59)
GFR, EST NON AFRICAN AMERICAN: 31 mL/min/{1.73_m2} — AB (ref >59)
Globulin, Total: 3 g/dL (ref 1.5–4.5)
Glucose: 110 mg/dL — ABNORMAL HIGH (ref 65–99)
POTASSIUM: 4.3 mmol/L (ref 3.5–5.2)
Sodium: 143 mmol/L (ref 134–144)
Total Protein: 7.1 g/dL (ref 6.0–8.5)

## 2017-10-26 LAB — CBC WITH DIFFERENTIAL/PLATELET
BASOS: 0 %
Basophils Absolute: 0 10*3/uL (ref 0.0–0.2)
EOS (ABSOLUTE): 0.3 10*3/uL (ref 0.0–0.4)
EOS: 7 %
HEMOGLOBIN: 12.6 g/dL (ref 11.1–15.9)
Hematocrit: 38.1 % (ref 34.0–46.6)
IMMATURE GRANS (ABS): 0 10*3/uL (ref 0.0–0.1)
Immature Granulocytes: 0 %
LYMPHS: 38 %
Lymphocytes Absolute: 1.6 10*3/uL (ref 0.7–3.1)
MCH: 31.6 pg (ref 26.6–33.0)
MCHC: 33.1 g/dL (ref 31.5–35.7)
MCV: 96 fL (ref 79–97)
MONOCYTES: 10 %
Monocytes Absolute: 0.4 10*3/uL (ref 0.1–0.9)
Neutrophils Absolute: 1.9 10*3/uL (ref 1.4–7.0)
Neutrophils: 45 %
Platelets: 200 10*3/uL (ref 150–379)
RBC: 3.99 x10E6/uL (ref 3.77–5.28)
RDW: 14 % (ref 12.3–15.4)
WBC: 4.2 10*3/uL (ref 3.4–10.8)

## 2017-10-26 LAB — HEMOGLOBIN A1C
Est. average glucose Bld gHb Est-mCnc: 108 mg/dL
Hgb A1c MFr Bld: 5.4 % (ref 4.8–5.6)

## 2017-10-26 LAB — LIPID PANEL
Chol/HDL Ratio: 2.4 ratio (ref 0.0–4.4)
Cholesterol, Total: 204 mg/dL — ABNORMAL HIGH (ref 100–199)
HDL: 84 mg/dL (ref >39)
LDL Calculated: 93 mg/dL (ref 0–99)
TRIGLYCERIDES: 135 mg/dL (ref 0–149)
VLDL Cholesterol Cal: 27 mg/dL (ref 5–40)

## 2017-10-28 DIAGNOSIS — H401332 Pigmentary glaucoma, bilateral, moderate stage: Secondary | ICD-10-CM | POA: Diagnosis not present

## 2017-11-24 ENCOUNTER — Other Ambulatory Visit: Payer: Self-pay | Admitting: Family Medicine

## 2018-01-12 ENCOUNTER — Encounter: Payer: Self-pay | Admitting: Family Medicine

## 2018-01-13 ENCOUNTER — Telehealth: Payer: Self-pay | Admitting: Family Medicine

## 2018-01-13 NOTE — Telephone Encounter (Signed)
I left a voicemail in regards to cancelling/rescheduling her appt she has with Dr. Tamala Julian on 05/02/2018 due to provider leaving.

## 2018-02-16 DIAGNOSIS — H01021 Squamous blepharitis right upper eyelid: Secondary | ICD-10-CM | POA: Diagnosis not present

## 2018-02-16 DIAGNOSIS — H01024 Squamous blepharitis left upper eyelid: Secondary | ICD-10-CM | POA: Diagnosis not present

## 2018-02-16 DIAGNOSIS — H401132 Primary open-angle glaucoma, bilateral, moderate stage: Secondary | ICD-10-CM | POA: Diagnosis not present

## 2018-02-21 ENCOUNTER — Other Ambulatory Visit: Payer: Self-pay | Admitting: Family Medicine

## 2018-03-17 DIAGNOSIS — H401332 Pigmentary glaucoma, bilateral, moderate stage: Secondary | ICD-10-CM | POA: Diagnosis not present

## 2018-03-31 DIAGNOSIS — H401332 Pigmentary glaucoma, bilateral, moderate stage: Secondary | ICD-10-CM | POA: Diagnosis not present

## 2018-03-31 DIAGNOSIS — H04123 Dry eye syndrome of bilateral lacrimal glands: Secondary | ICD-10-CM | POA: Diagnosis not present

## 2018-05-02 ENCOUNTER — Ambulatory Visit: Payer: Medicare Other | Admitting: Family Medicine

## 2018-05-10 ENCOUNTER — Telehealth: Payer: Self-pay | Admitting: Family Medicine

## 2018-05-10 NOTE — Telephone Encounter (Signed)
Called and spoke with pt to reschedule her appt with Dr. Brigitte Pulse from 05/17/18. I was able to reschedule her to 07/20/18 with Dr. Brigitte Pulse at 8:40 am. I advised of time, building number and late policy.

## 2018-05-12 ENCOUNTER — Other Ambulatory Visit: Payer: Self-pay | Admitting: Family Medicine

## 2018-05-17 ENCOUNTER — Ambulatory Visit: Payer: Medicare Other | Admitting: Family Medicine

## 2018-05-18 ENCOUNTER — Encounter: Payer: Self-pay | Admitting: Family Medicine

## 2018-05-18 ENCOUNTER — Ambulatory Visit (INDEPENDENT_AMBULATORY_CARE_PROVIDER_SITE_OTHER): Payer: Medicare Other | Admitting: Family Medicine

## 2018-05-18 VITALS — BP 132/84 | HR 78 | Temp 98.6°F | Ht 65.0 in | Wt 238.0 lb

## 2018-05-18 DIAGNOSIS — R109 Unspecified abdominal pain: Secondary | ICD-10-CM

## 2018-05-18 DIAGNOSIS — Z23 Encounter for immunization: Secondary | ICD-10-CM | POA: Diagnosis not present

## 2018-05-18 LAB — POCT URINALYSIS DIP (MANUAL ENTRY)
BILIRUBIN UA: NEGATIVE
BILIRUBIN UA: NEGATIVE mg/dL
Blood, UA: NEGATIVE
GLUCOSE UA: NEGATIVE mg/dL
LEUKOCYTES UA: NEGATIVE
Nitrite, UA: NEGATIVE
PROTEIN UA: NEGATIVE mg/dL
SPEC GRAV UA: 1.02 (ref 1.010–1.025)
Urobilinogen, UA: 0.2 E.U./dL
pH, UA: 5 (ref 5.0–8.0)

## 2018-05-18 NOTE — Patient Instructions (Addendum)
Urine test is normal today.  Vital signs also normal and without having any abdominal pain  less likely to have an abdominal infection.  It is possible some of your discomfort could be from muscles or from sitting a certain way with furniture at home.  Okay to continue Tylenol for now, heat or ice to that area with gentle range of motion/stretches.  If you have any abdominal pain, new urinary symptoms, fevers, nausea, vomiting or diarrhea, or any new rash or worsening symptoms return right away for recheck.    Flank Pain, Adult Flank pain is pain that is located on the side of the body between the upper abdomen and the back. This area is called the flank. The pain may occur over a short period of time (acute), or it may be long-term or recurring (chronic). It may be mild or severe. Flank pain can be caused by many things, including:  Muscle soreness or injury.  Kidney stones or kidney disease.  Stress.  A disease of the spine (vertebral disk disease).  A lung infection (pneumonia).  Fluid around the lungs (pulmonary edema).  A skin rash caused by the chickenpox virus (shingles).  Tumors that affect the back of the abdomen.  Gallbladder disease.  Follow these instructions at home:  Drink enough fluid to keep your urine clear or pale yellow.  Rest as told by your health care provider.  Take over-the-counter and prescription medicines only as told by your health care provider.  Keep a journal to track what has caused your flank pain and what has made it feel better.  Keep all follow-up visits as told by your health care provider. This is important. Contact a health care provider if:  Your pain is not controlled with medicine.  You have new symptoms.  Your pain gets worse.  You have a fever.  Your symptoms last longer than 2-3 days.  You have trouble urinating or you are urinating very frequently. Get help right away if:  You have trouble breathing or you are short of  breath.  Your abdomen hurts or it is swollen or red.  You have nausea or vomiting.  You feel faint or you pass out.  You have blood in your urine. Summary  Flank pain is pain that is located on the side of the body between the upper abdomen and the back.  The pain may occur over a short period of time (acute), or it may be long-term or recurring (chronic). It may be mild or severe.  Flank pain can be caused by many things.  Contact your health care provider if your symptoms get worse or they last longer than 2-3 days. This information is not intended to replace advice given to you by your health care provider. Make sure you discuss any questions you have with your health care provider. Document Released: 09/24/2005 Document Revised: 10/16/2016 Document Reviewed: 10/16/2016 Elsevier Interactive Patient Education  [employer].   If you have lab work done today you will be contacted with your lab results within the next 2 weeks.  If you have not heard from [place] then please contact us. The fastest way to get your results is to register for My Chart.   IF you received an x-ray today, you will receive an invoice from [facility]. Please contact [facility] at [phone] with questions or concerns regarding your invoice.   IF you received labwork today, you will receive an invoice from [facility]. Please contact [facility] at [phone]  with questions or concerns regarding your invoice.   Our billing staff will not be able to assist you with questions regarding bills from these companies.  You will be contacted with the lab results as soon as they are available. The fastest way to get your results is to activate your My Chart account. Instructions are located on the last page of this paperwork. If you have not heard from [place] regarding the results in 2 weeks, please contact this office.

## 2018-05-18 NOTE — Progress Notes (Signed)
Subjective:  By signing my name below, I, Tammy Mccullough, attest that this documentation has been prepared under the direction and in the presence of Merri Ray, MD. Electronically Signed: Moises Mccullough, [place]. 05/18/2018 , 8:31 AM .  Patient was seen in Room 1 .   Patient ID: Tammy Mccullough, female    DOB: 01/18/1941, 77 y.o.   MRN: [id number] Chief Complaint  Patient presents with  . Pain    pain on the left side of above the hip that radiates. Thinks it may be due to how she sits in the chairs at home   HPI Tammy Mccullough is a 77 y.o. female  Here with left sided hip/abdominal pain.   Patient mentions she has big pieces of furniture with lots of pillows in front, where she can't lean all the way back. She notes because of this, she leans a certain way forward. She describes a dull pain initially noticed about 4 days ago. When she sits or applying pressure to the area, she can feel it. She denies having any hard chairs at home. She's been taking a Tylenol Extra Strength for this. If she packs pillows around the area, it eases the discomfort. She denies any radiating pain. She denies any new activities, twisting or turning. She denies nausea, vomiting, diarrhea, urinary frequency, hematuria, urgency or dysuria. She's been able to eat and drink without difficulty. She had a colonoscopy in March 2016 with diverticulosis was present; denies a flare of diverticulitis. She denies history of kidney stones.   Patient Active Problem List   Diagnosis Date Noted  . Body mass index (BMI) of 40.0-44.9 in adult ([place]) 10/25/2017  . 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 Glucose intolerance (impaired glucose tolerance) 04/21/2016  . Chronic renal insufficiency 09/09/2015  . Glaucoma 02/09/2013  . HTN (hypertension) 02/09/2013   Past Medical History:  Diagnosis Date  . Arthritis    B knees.  .  Cataract   . Glaucoma   . Glucose intolerance (impaired glucose tolerance)   . Hypertension   . Renal insufficiency    Past Surgical History:  Procedure Laterality Date  . CATARACT EXTRACTION Right 07-2014  . CATARACT EXTRACTION Left 01/16/2016  . COLONOSCOPY     10 +yrs ago in [place], normal per pt  . VAGINAL DELIVERY     x2   No Known Allergies Prior to Admission medications   Medication Sig Start Date End Date Taking? Authorizing Provider  aspirin 81 MG tablet Take 81 mg by mouth daily. Reported on 02/15/2016    [provider]  AZOPT 1 % ophthalmic suspension  10/03/16   [provider]  capsicum (ZOSTRIX) 0.075 % topical cream Apply 1 application topically 2 (two) times daily. 01/15/17   McVey, Gelene Mink, PA-C  COMBIGAN 0.2-0.5 % ophthalmic solution  09/08/16   [provider]  meloxicam (MOBIC) 7.5 MG tablet TAKE 1 TABLET(7.5 MG) BY MOUTH DAILY 02/22/18   Wardell Honour, MD  olmesartan-hydrochlorothiazide (BENICAR HCT) 40-25 MG tablet TAKE 1 TABLET BY MOUTH DAILY 05/16/18   Forrest Moron, MD  ZIOPTAN 0.0015 % SOLN  09/26/16   [provider]   Social History   Socioeconomic History  . Marital status: Widowed    Spouse name: Not on file  . Number of children: 2  . Years of education: Not on file  . Highest education level:  Not on file  Occupational History  . Occupation: retired  Scientific laboratory technician  . Financial resource strain: Not on file  . Food insecurity:    Worry: Not on file    Inability: Not on file  . Transportation needs:    Medical: Not on file    Non-medical: Not on file  Tobacco Use  . Smoking status: Never Smoker  . Smokeless tobacco: Never Used  Substance and Sexual Activity  . Alcohol use: Yes    Alcohol/week: 6.0 standard drinks    Types: 6 Standard drinks or equivalent per week    Comment: 2-3  . Drug use: No  . Sexual activity: Not Currently  Lifestyle  . Physical activity:    Days per week: Not on file     Minutes per session: Not on file  . Stress: Not on file  Relationships  . Social connections:    Talks on phone: Not on file    Gets together: Not on file    Attends religious service: Not on file    Active member of club or organization: Not on file    Attends meetings of clubs or organizations: Not on file    Relationship status: Not on file  . Intimate partner violence:    Fear of current or ex partner: Not on file    Emotionally abused: Not on file    Physically abused: Not on file    Forced sexual activity: Not on file  Other Topics Concern  . Not on file  Social History Narrative   Marital status: widowed since 2010 after 34 years of marriage.  Not dating but would like to be.      Children:  2 Children; no grandchildren; 2 adopted grandchildren.  One son is disabled; patient supports; son is obese.      Living: alone with 1 cat.      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      Tobacco: never      Alcohol: weekends liquor; some during the week.  3-5 cups per week.      Drugs: none      Exercise: water aerobics three days per week.  [employer].      Seatbelt: 100%;      Guns: none      ADLs:: independent with ADLs.   Drives. No assistant devices.      Advanced Directives: yes; healthcare POA:  Older brother [name]);  FULL CODE but no prolonged measures.     Review of Systems  Constitutional: Negative for chills, fatigue, fever and unexpected weight change.  Respiratory: Negative for cough.   Gastrointestinal: Negative for abdominal pain, constipation, diarrhea, nausea and vomiting.  Genitourinary: Negative for difficulty urinating, dysuria, enuresis, frequency, hematuria and urgency.  Musculoskeletal: Positive for myalgias (left flank).  Skin: Negative for rash and wound.  Neurological: Negative for dizziness, weakness and headaches.       Objective:   Physical Exam  Constitutional: She is  oriented to person, place, and time. She appears well-developed and well-nourished. No distress.  HENT:  Head: Normocephalic and atraumatic.  Eyes: Pupils are equal, round, and reactive to light. EOM are normal.  Neck: Neck supple.  Cardiovascular: Normal rate.  Pulmonary/Chest: Effort normal. No respiratory distress.  Abdominal: Soft. Bowel sounds are normal. She exhibits no distension. There is no tenderness.  Musculoskeletal: Normal range of motion.  No midline bony tenderness of L-spine, describes area  of discomfort over left lower flank area; skin is intact, no rash, no focal tenderness along the skin, slightly tender over the ASIS, pain free ROM L-spine, pain free hip ROM  Neurological: She is alert and oriented to person, place, and time.  Skin: Skin is warm and dry.  Psychiatric: She has a normal mood and affect. Her behavior is normal.  Nursing note and vitals reviewed.   Vitals:   05/18/18 0814 05/18/18 0845  BP: (!) 156/79 132/84  Pulse: 78   Temp: 98.6 F (37 C)   TempSrc: Oral   SpO2: 98%   Weight: 238 lb (108 kg)   Height: 5\' 5"  (1.651 m)    Results for orders placed or performed in visit on 05/18/18  POCT urinalysis dipstick  Result Value Ref Range   Color, UA yellow yellow   Clarity, UA clear clear   Glucose, UA negative negative mg/dL   Bilirubin, UA negative negative   Ketones, POC UA negative negative mg/dL   Spec Grav, UA 1.020 1.010 - 1.025   Mccullough, UA negative negative   pH, UA 5.0 5.0 - 8.0   Protein Ur, POC negative negative mg/dL   Urobilinogen, UA 0.2 0.2 or 1.0 E.U./dL   Nitrite, UA Negative Negative   Leukocytes, UA Negative Negative       Assessment & Plan:    SEDONIA KITNER is a 77 y.o. female Left flank pain - Plan: POCT urinalysis dipstick  -Reproducible with palpation over the obliques.  Abdominal exam was nontender, no CVA tenderness, hip with pain-free range of motion and no known injury.  No infectious symptoms. although history  of diverticulosis, she denies any bowel changes and abdominal exam was benign.  No skin dysesthesias and no rash 5 days into symptoms, less likely zoster.  -Suspected muscular cause at this time.  Tylenol over-the-counter, symptomatic care with heat or cool compresses as needed with gentle range of motion and stretches.  RTC precautions if not improving next few days or any worsening symptoms  Flu vaccine need - Plan: Flu vaccine HIGH DOSE PF (Fluzone High dose)   No orders of the defined types were placed in this encounter.  Patient Instructions    Urine test is normal today.  Vital signs also normal and without having any abdominal pain  less likely to have an abdominal infection.  It is possible some of your discomfort could be from muscles or from sitting a certain way with furniture at home.  Okay to continue Tylenol for now, heat or ice to that area with gentle range of motion/stretches.  If you have any abdominal pain, new urinary symptoms, fevers, nausea, vomiting or diarrhea, or any new rash or worsening symptoms return right away for recheck.    Flank Pain, Adult Flank pain is pain that is located on the side of the body between the upper abdomen and the back. This area is called the flank. The pain may occur over a short period of time (acute), or it may be long-term or recurring (chronic). It may be mild or severe. Flank pain can be caused by many things, including:  Muscle soreness or injury.  Kidney stones or kidney disease.  Stress.  A disease of the spine (vertebral disk disease).  A lung infection (pneumonia).  Fluid around the lungs (pulmonary edema).  A skin rash caused by the chickenpox virus (shingles).  Tumors that affect the back of the abdomen.  Gallbladder disease.  Follow these instructions at home:  Drink enough fluid to keep your urine clear or pale yellow.  Rest as told by your health care provider.  Take over-the-counter and prescription  medicines only as told by your health care provider.  Keep a journal to track what has caused your flank pain and what has made it feel better.  Keep all follow-up visits as told by your health care provider. This is important. Contact a health care provider if:  Your pain is not controlled with medicine.  You have new symptoms.  Your pain gets worse.  You have a fever.  Your symptoms last longer than 2-3 days.  You have trouble urinating or you are urinating very frequently. Get help right away if:  You have trouble breathing or you are short of breath.  Your abdomen hurts or it is swollen or red.  You have nausea or vomiting.  You feel faint or you pass out.  You have Mccullough in your urine. Summary  Flank pain is pain that is located on the side of the body between the upper abdomen and the back.  The pain may occur over a short period of time (acute), or it may be long-term or recurring (chronic). It may be mild or severe.  Flank pain can be caused by many things.  Contact your health care provider if your symptoms get worse or they last longer than 2-3 days. This information is not intended to replace advice given to you by your health care provider. Make sure you discuss any questions you have with your health care provider. Document Released: 09/24/2005 Document Revised: 10/16/2016 Document Reviewed: 10/16/2016 Elsevier Interactive Patient Education  [employer].   If you have lab work done today you will be contacted with your lab results within the next 2 weeks.  If you have not heard from [place] then please contact us. The fastest way to get your results is to register for My Chart.   IF you received an x-ray today, you will receive an invoice from [facility]. Please contact [facility] at [phone] with questions or concerns regarding your invoice.   IF you received labwork today, you will receive an invoice from [place]. Please contact  [facility] at [phone] with questions or concerns regarding your invoice.   Our billing staff will not be able to assist you with questions regarding bills from these companies.  You will be contacted with the lab results as soon as they are available. The fastest way to get your results is to activate your My Chart account. Instructions are located on the last page of this paperwork. If you have not heard from [place] regarding the results in 2 weeks, please contact this office.      I personally performed the services described in this documentation, which was scribed in my presence. The recorded information has been reviewed and considered for accuracy and completeness, addended by me as needed, and agree with information above.  Signed,   Merri Ray, MD [facility] [place].  05/18/18 8:59 AM

## 2018-05-26 ENCOUNTER — Other Ambulatory Visit: Payer: Self-pay | Admitting: Family Medicine

## 2018-05-27 NOTE — Telephone Encounter (Signed)
Patient is requesting a refill of the following medications: Requested Prescriptions   Pending Prescriptions Disp Refills  . meloxicam (MOBIC) 7.5 MG tablet [Pharmacy Med Name: MELOXICAM 7.5MG  TABLETS] 90 tablet 0    Sig: TAKE 1 TABLET(7.5 MG) BY MOUTH DAILY    Date of patient request: 05/26/2018 Last office visit: 05/18/2018 Date of last refill: 02/22/2018 Last refill amount: 90 Follow up time period per chart:

## 2018-07-07 DIAGNOSIS — H401132 Primary open-angle glaucoma, bilateral, moderate stage: Secondary | ICD-10-CM | POA: Diagnosis not present

## 2018-07-07 DIAGNOSIS — H04123 Dry eye syndrome of bilateral lacrimal glands: Secondary | ICD-10-CM | POA: Diagnosis not present

## 2018-07-08 IMAGING — DX DG SHOULDER 2+V*R*
3 series · 3 of 3 positions shown · non-contrast
Comparison: None.

CLINICAL DATA: 76-year-old female with night time right shoulder
pain.

EXAM:
RIGHT SHOULDER - 2+ VIEW

[shoulder ap]
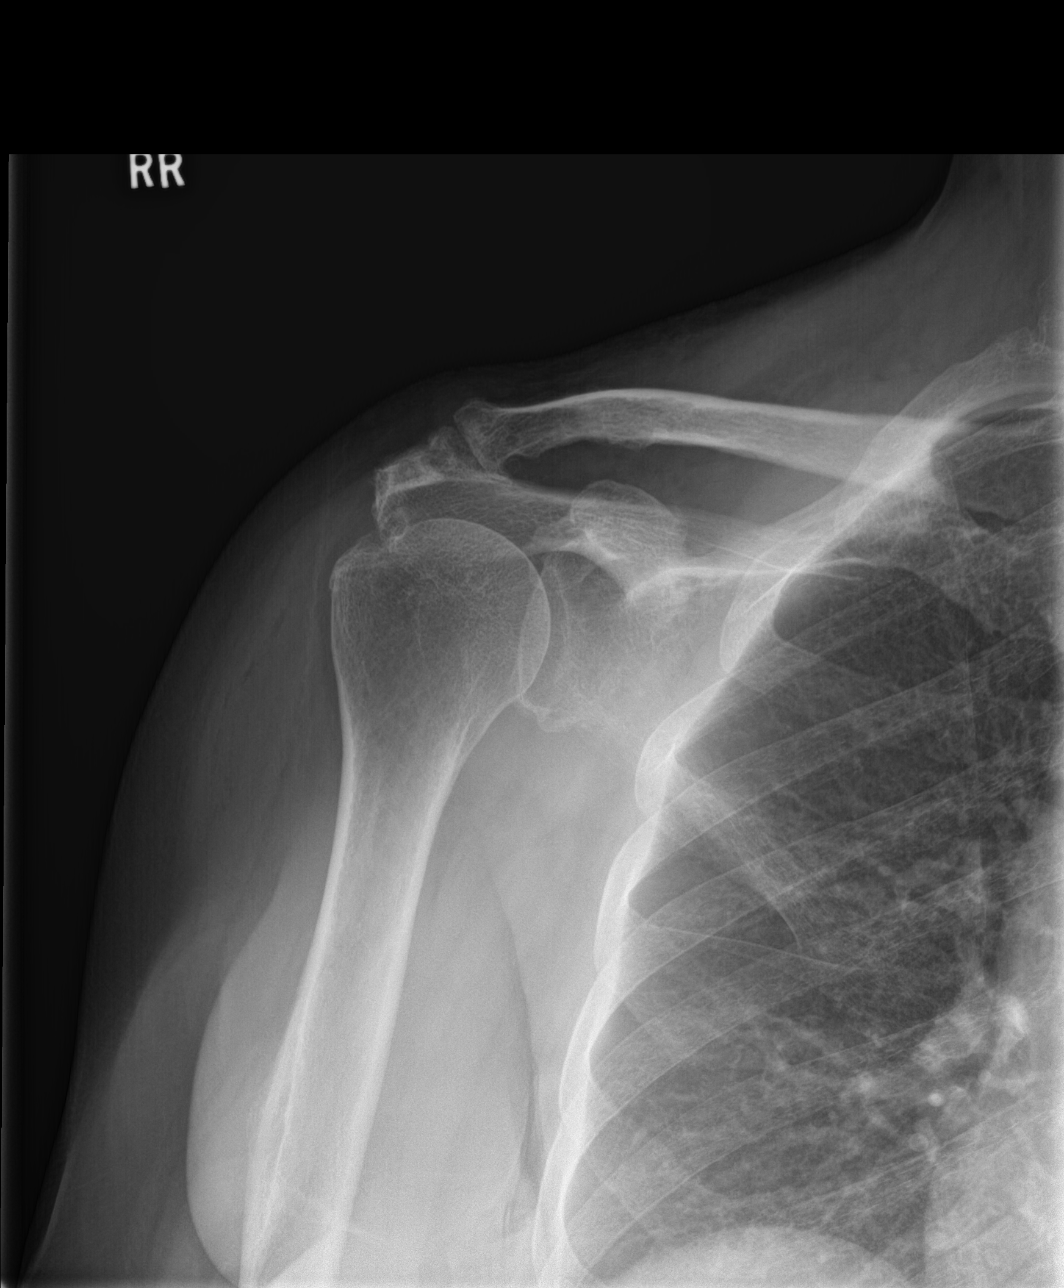

[shoulder y-view]
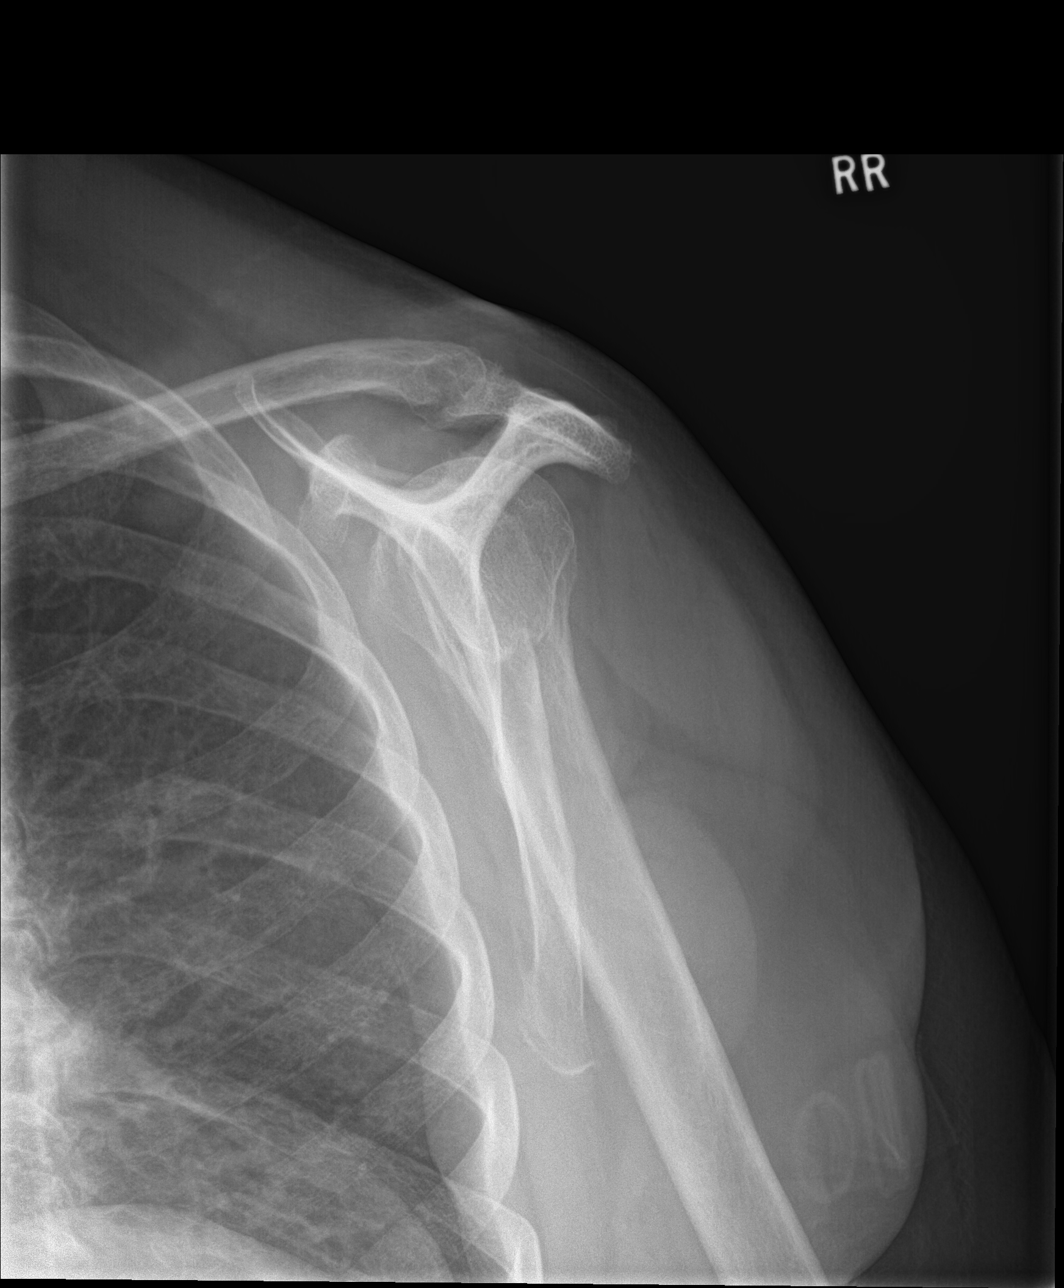

[shoulder axial]
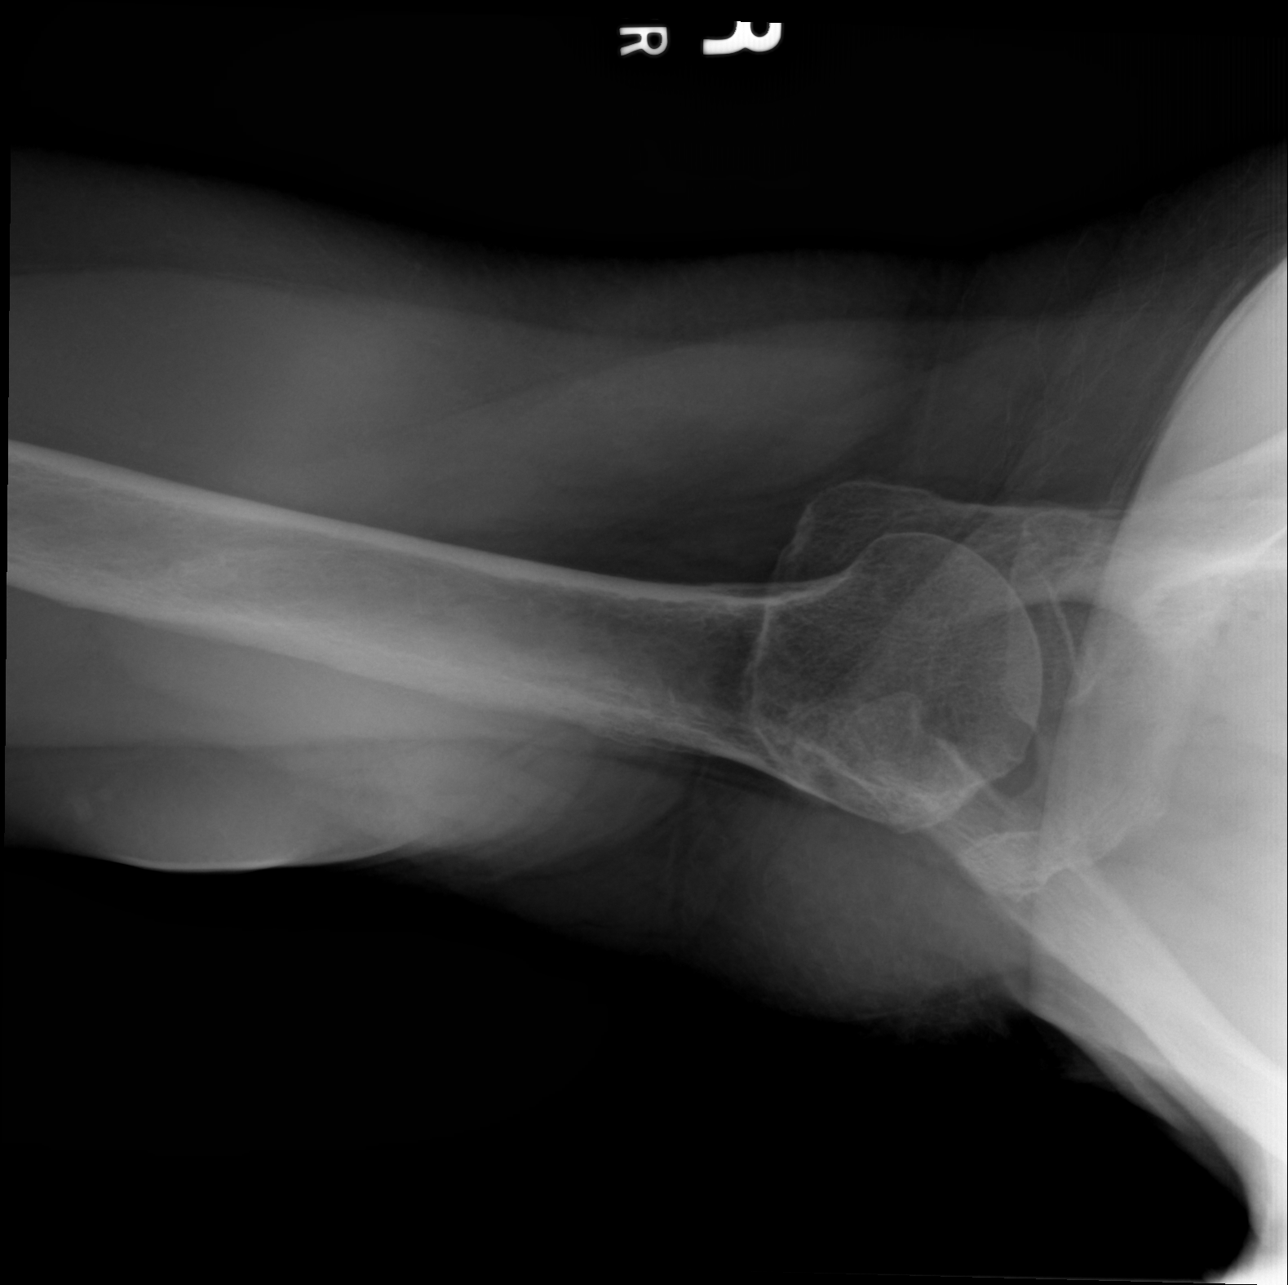

[3 of 3 positions shown; findings below may reference images not displayed]

FINDINGS: No glenohumeral joint dislocation. Mild degenerative spurring at the
inferior glenoid. Proximal right humerus intact. Right acromion and
acromioclavicular joint degenerative spurring. Visible right
clavicle and scapula intact. Visible right ribs and lung parenchyma
within normal limits.
IMPRESSION: No acute osseous abnormality identified. Acromion and right AC joint
degeneration.

## 2018-07-08 IMAGING — DX DG KNEE COMPLETE 4+V*L*
4 series · 4 of 4 positions shown · non-contrast
Comparison: None.

CLINICAL DATA: Acute left knee pain

EXAM:
LEFT KNEE - COMPLETE 4+ VIEW

[knee ap]
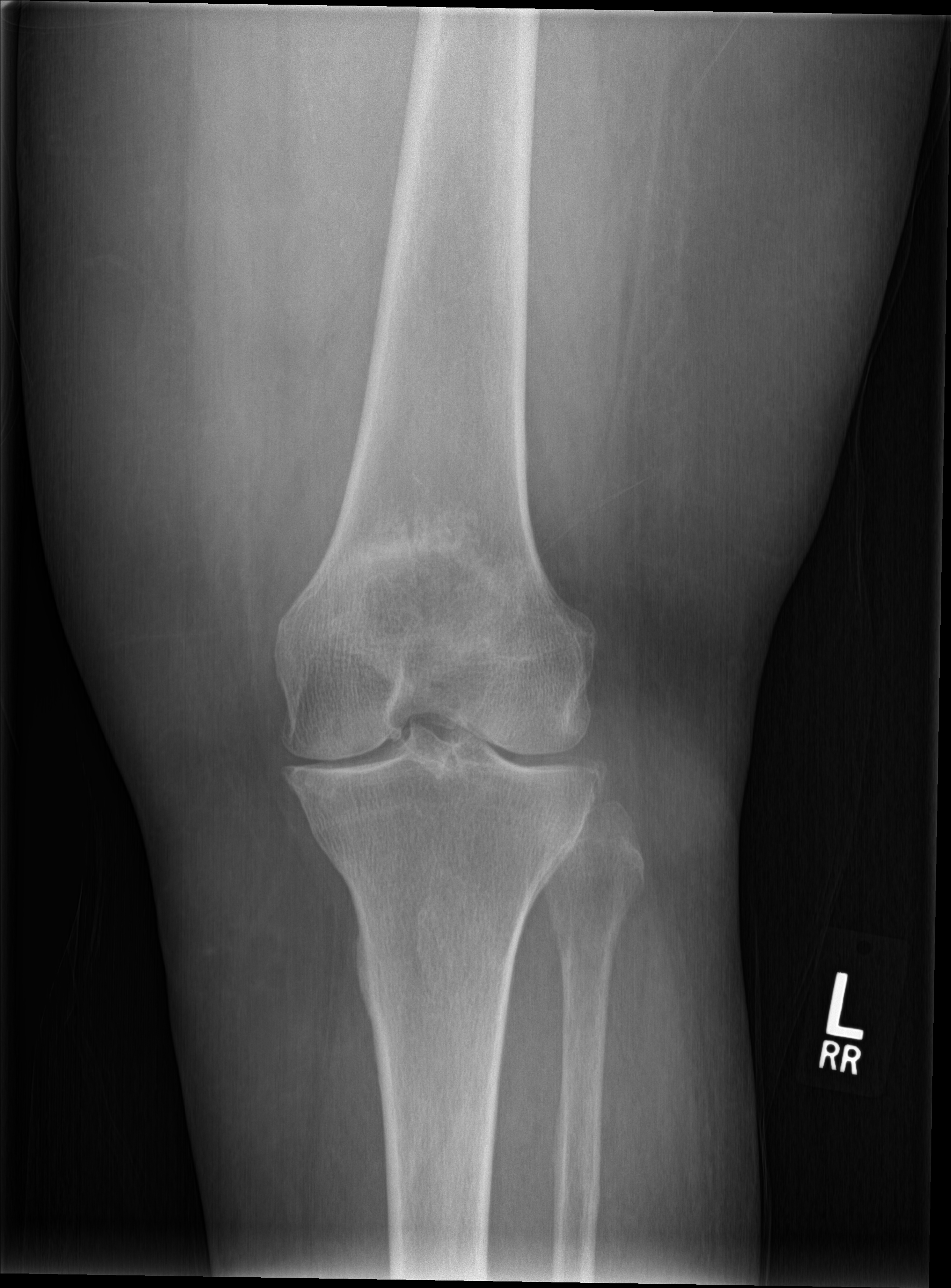

[knee obl (1 of 2)]
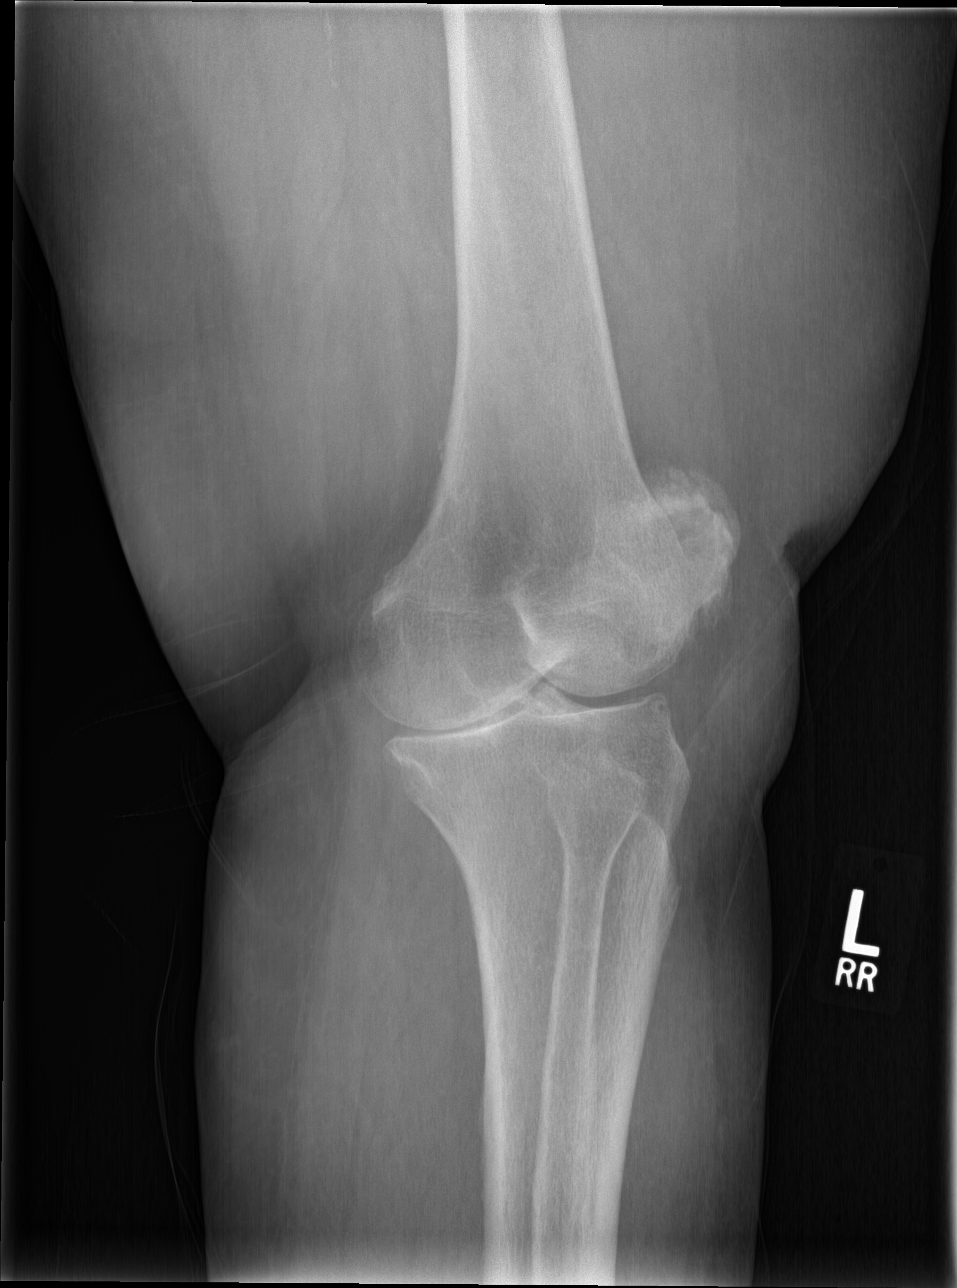

[knee obl (2 of 2)]
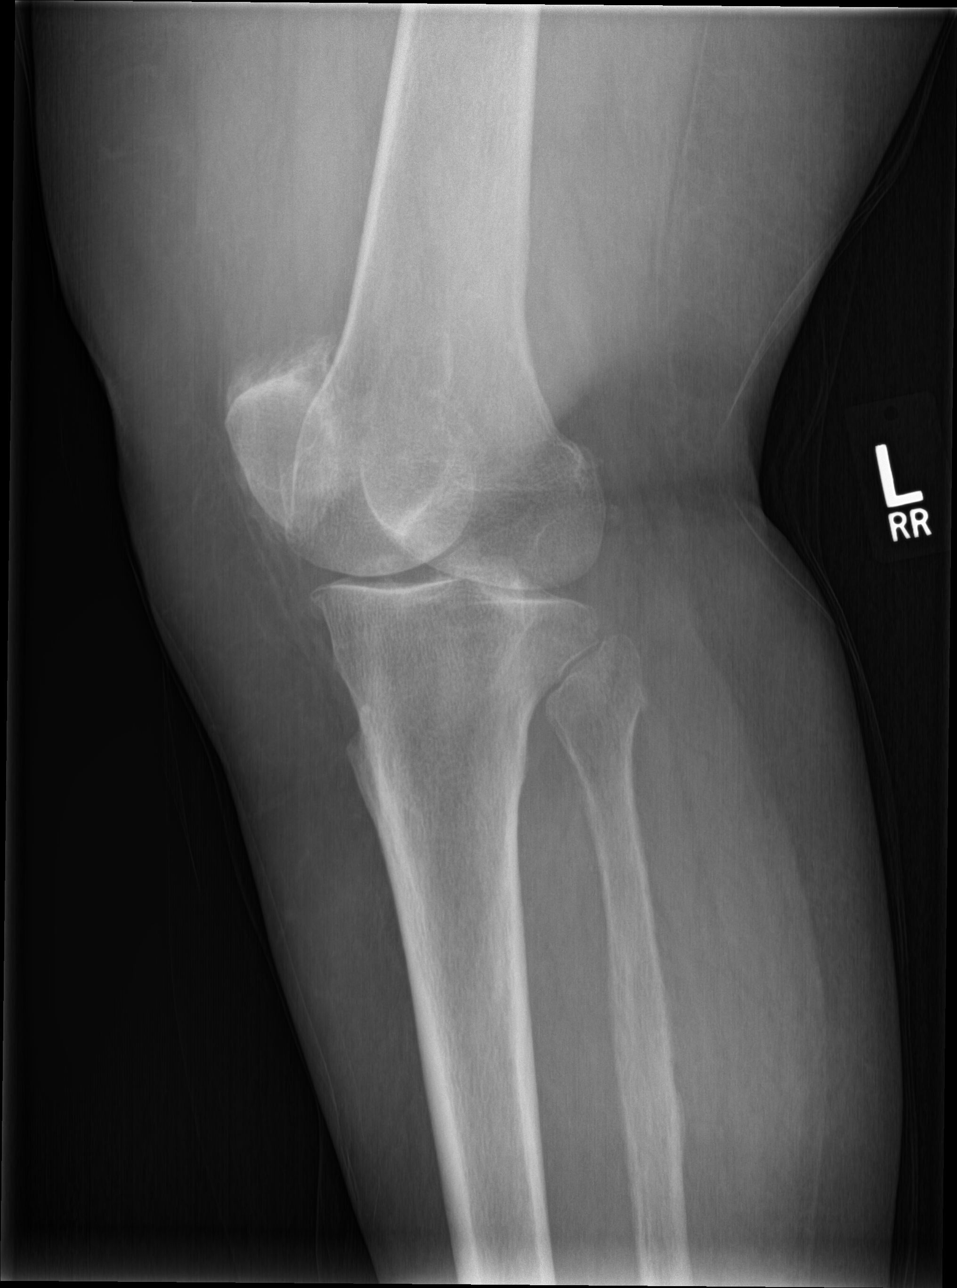

[knee lat]
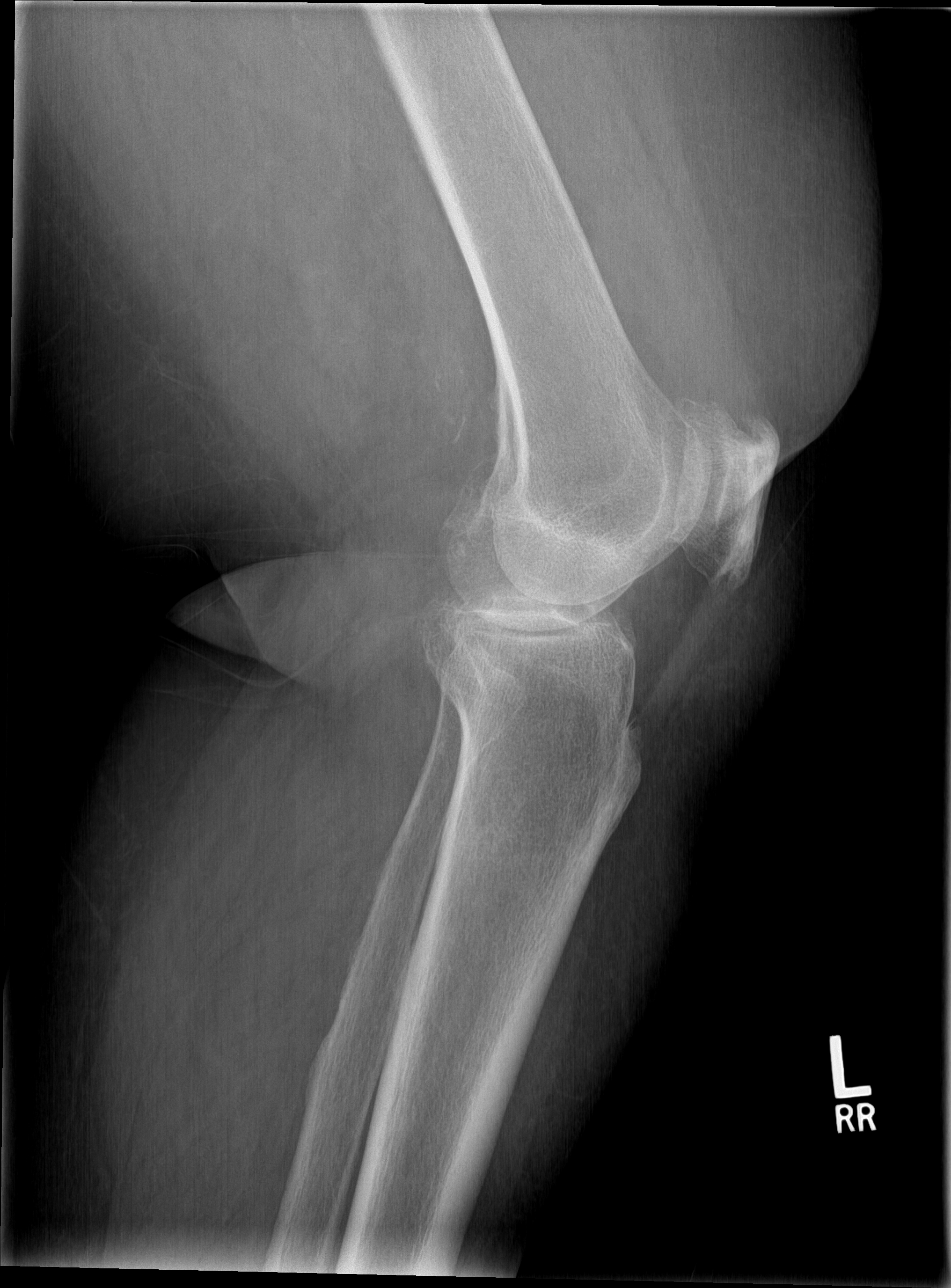

[4 of 4 positions shown; findings below may reference images not displayed]

FINDINGS: Moderate tricompartment degenerative changes within the left knee.
Small joint effusion. No acute bony abnormality. Specifically, no
fracture, subluxation, or dislocation. Soft tissues are intact.
IMPRESSION: Moderate degenerative changes with small joint effusion. No acute
bony abnormality.

## 2018-07-20 ENCOUNTER — Ambulatory Visit (INDEPENDENT_AMBULATORY_CARE_PROVIDER_SITE_OTHER): Payer: Medicare Other | Admitting: Family Medicine

## 2018-07-20 ENCOUNTER — Ambulatory Visit: Payer: Medicare Other | Admitting: Family Medicine

## 2018-07-20 ENCOUNTER — Encounter: Payer: Self-pay | Admitting: Family Medicine

## 2018-07-20 ENCOUNTER — Other Ambulatory Visit: Payer: Self-pay

## 2018-07-20 VITALS — BP 138/80 | HR 91 | Temp 98.0°F | Resp 16 | Wt 236.0 lb

## 2018-07-20 DIAGNOSIS — N181 Chronic kidney disease, stage 1: Secondary | ICD-10-CM | POA: Diagnosis not present

## 2018-07-20 DIAGNOSIS — I10 Essential (primary) hypertension: Secondary | ICD-10-CM

## 2018-07-20 MED ORDER — OLMESARTAN MEDOXOMIL-HCTZ 40-25 MG PO TABS: 1.0000 | ORAL_TABLET | Freq: Every day | ORAL | 1 refills | Status: DC

## 2018-07-20 MED ORDER — DICLOFENAC SODIUM 1 % TD GEL: 2.0000 g | Freq: Four times a day (QID) | TRANSDERMAL | 2 refills | Status: DC

## 2018-07-20 NOTE — Progress Notes (Signed)
Subjective:    Patient: Tammy Mccullough  DOB: 1940/12/19; 77 y.o.   MRN: [id number]  Chief Complaint  Patient presents with  . Establish Care    transfer from Dr. Smith/ follow-up for chronic conditions     HPI Glaucoma:   HTN: Checks BP at home 130s-140s-151/81-84  Knee OA: Uses rare meloxicam. She has not tried any rx topicals.  Sev yrs ago she was having right upper arm aching at night but not during the day.  Sometimes she can position it so it eases off.    Medical History Past Medical History:  Diagnosis Date  . Arthritis    B knees.  . Cataract   . Glaucoma   . Glucose intolerance (impaired glucose tolerance)   . Hypertension   . Renal insufficiency    Past Surgical History:  Procedure Laterality Date  . CATARACT EXTRACTION Right 07-2014  . CATARACT EXTRACTION Left 01/16/2016  . COLONOSCOPY     10 +yrs ago in [place], normal per pt  . VAGINAL DELIVERY     x2   Current Outpatient Medications on File Prior to Visit  Medication Sig Dispense Refill  . aspirin 81 MG tablet Take 81 mg by mouth daily. Reported on 02/15/2016    . AZOPT 1 % ophthalmic suspension     . capsicum (ZOSTRIX) 0.075 % topical cream Apply 1 application topically 2 (two) times daily. 28.3 g 0  . meloxicam (MOBIC) 7.5 MG tablet TAKE 1 TABLET(7.5 MG) BY MOUTH DAILY 90 tablet 0  . olmesartan-hydrochlorothiazide (BENICAR HCT) 40-25 MG tablet TAKE 1 TABLET BY MOUTH DAILY 90 tablet 0  . ZIOPTAN 0.0015 % SOLN      No current facility-administered medications on file prior to visit.    No Known Allergies Family History  Problem Relation Age of Onset  . Diabetes Mother   . Heart disease Mother        unsure  . COPD Father   . Diabetes Sister   . Hypertension Sister   . Kidney disease Sister   . Diabetes Brother   . Diabetes Brother   . Colon cancer Neg Hx   . Rectal cancer Neg Hx   . Stomach cancer Neg Hx    Social History   Socioeconomic History  . Marital status: Widowed    Spouse  name: Not on file  . Number of children: 2  . Years of education: Not on file  . Highest education level: Not on file  Occupational History  . Occupation: retired  Scientific laboratory technician  . Financial resource strain: Not on file  . Food insecurity:    Worry: Not on file    Inability: Not on file  . Transportation needs:    Medical: Not on file    Non-medical: Not on file  Tobacco Use  . Smoking status: Never Smoker  . Smokeless tobacco: Never Used  Substance and Sexual Activity  . Alcohol use: Yes    Alcohol/week: 6.0 standard drinks    Types: 6 Standard drinks or equivalent per week    Comment: 2-3  . Drug use: No  . Sexual activity: Not Currently  Lifestyle  . Physical activity:    Days per week: Not on file    Minutes per session: Not on file  . Stress: Not on file  Relationships  . Social connections:    Talks on phone: Not on file    Gets together: Not on file    Attends religious service: Not  on file    Active member of club or organization: Not on file    Attends meetings of clubs or organizations: Not on file    Relationship status: Not on file  Other Topics Concern  . Not on file  Social History Narrative   Marital status: widowed since 2010 after 13 years of marriage.  Not dating but would like to be.      Children:  2 Children; no grandchildren; 2 adopted grandchildren.  One son is disabled; patient supports; son is obese.      Living: alone with 1 cat.      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      Tobacco: never      Alcohol: weekends liquor; some during the week.  3-5 cups per week.      Drugs: none      Exercise: water aerobics three days per week.  [employer].      Seatbelt: 100%;      Guns: none      ADLs:: independent with ADLs.   Drives. No assistant devices.      Advanced Directives: yes; healthcare POA:  Older brother [name]);  FULL CODE but no prolonged measures.     Depression  screen [facility] 2/9 05/18/2018 10/25/2017 04/27/2017 01/15/2017 10/20/2016  Decreased Interest 0 0 0 0 0  Down, Depressed, Hopeless 0 0 0 0 0  PHQ - 2 Score 0 0 0 0 0    ROS As noted in HPI  Objective:  BP 138/80   Pulse 91   Temp 98 F (36.7 C) (Oral)   Resp 16   Wt 236 lb (107 kg)   SpO2 95%   BMI 39.27 kg/m  Physical Exam Constitutional:      General: She is not in acute distress.    Appearance: She is well-developed. She is not diaphoretic.  HENT:     Head: Normocephalic and atraumatic.     Right Ear: External ear normal.     Left Ear: External ear normal.  Eyes:     General: No scleral icterus.    Conjunctiva/sclera: Conjunctivae normal.  Neck:     Musculoskeletal: Normal range of motion and neck supple.     Thyroid: No thyromegaly.  Cardiovascular:     Rate and Rhythm: Normal rate and regular rhythm.     Heart sounds: Normal heart sounds.  Pulmonary:     Effort: Pulmonary effort is normal. No respiratory distress.     Breath sounds: Normal breath sounds.  Lymphadenopathy:     Cervical: No cervical adenopathy.  Skin:    General: Skin is warm and dry.     Findings: No erythema.  Neurological:     Mental Status: She is alert and oriented to person, place, and time.  Psychiatric:        Behavior: Behavior normal.     [facility] Office Visit on 07/20/2018  Component Date Value Ref Range Status  . Glucose 07/20/2018 82  65 - 99 mg/dL Final  . BUN 07/20/2018 29* 8 - 27 mg/dL Final  . Creatinine, Ser 07/20/2018 1.46* 0.57 - 1.00 mg/dL Final  . GFR calc non Af Amer 07/20/2018 34* >59 mL/min/1.73 Final  . GFR calc Af Amer 07/20/2018 39* >59 mL/min/1.73 Final  . BUN/Creatinine Ratio 07/20/2018 20  12 - 28 Final  . Sodium 07/20/2018 142  134 - 144 mmol/L Final  . Potassium 07/20/2018 3.9  3.5 - 5.2 mmol/L Final  . Chloride 07/20/2018 104  96 - 106 mmol/L Final  . CO2 07/20/2018 21  20 - 29 mmol/L Final  . Calcium 07/20/2018 9.4  8.7 - 10.3 mg/dL Final  . Creatinine,  Urine 07/20/2018 55.3  Not Estab. mg/dL Final  . Microalbumin, Urine 07/20/2018 <3.0  Not Estab. ug/mL Final   **Verified by repeat analysis**  . Microalb/Creat Ratio 07/20/2018 <5.4  0.0 - 30.0 mg/g creat Final   Comment:                      Normal:                0.0 -  30.0                      Albuminuria:          31.0 - 300.0                      Clinical albuminuria:       >300.0      Assessment & Plan:   1. Essential hypertension   2. Chronic renal impairment, stage 1     Patient will continue on current chronic medications other than changes noted above, so ok to refill when needed.   See after visit summary for patient specific instructions.  Orders Placed This Encounter  Procedures  . Basic metabolic panel    Order Specific Question:   Has the patient fasted?    Answer:   No  . Microalbumin/Creatinine Ratio, Urine    Meds ordered this encounter  Medications  . diclofenac sodium (VOLTAREN) 1 % GEL    Sig: Apply 2 g topically 4 (four) times daily. For knee OA    Dispense:  100 g    Refill:  2  . olmesartan-hydrochlorothiazide (BENICAR HCT) 40-25 MG tablet    Sig: Take 1 tablet by mouth daily.    Dispense:  90 tablet    Refill:  1    Patient verbalized to me that they understand the following: diagnosis, what is being done for them, what to expect and what should be done at home.  Their questions have been answered. They understand that I am unable to predict every possible medication interaction or adverse outcome and that if any unexpected symptoms arise, they should contact us and their pharmacist, as well as never hesitate to seek urgent/emergent care at [facility] or ER if they think it might be warranted.    Delman Cheadle, MD, MPH [facility] at [place], [place]  [phone] Office phone  [phone] Office fax  07/20/18 2:19 PM

## 2018-07-20 NOTE — Patient Instructions (Addendum)
   If you have lab work done today you will be contacted with your lab results within the next 2 weeks.  If you have not heard from us then please contact us. The fastest way to get your results is to register for My Chart.   IF you received an x-ray today, you will receive an invoice from [facility]. Please contact [facility] at [phone] with questions or concerns regarding your invoice.   IF you received labwork today, you will receive an invoice from [facility]. Please contact [facility] at [phone] with questions or concerns regarding your invoice.   Our billing staff will not be able to assist you with questions regarding bills from these companies.  You will be contacted with the lab results as soon as they are available. The fastest way to get your results is to activate your My Chart account. Instructions are located on the last page of this paperwork. If you have not heard from us regarding the results in 2 weeks, please contact this office.     Chronic Kidney Disease, Adult Chronic kidney disease (CKD) occurs when the kidneys become damaged slowly over a long period of time. The kidneys are a pair of organs that do many important jobs in the body, including:  Removing waste and extra fluid from the blood to make urine.  Making hormones that maintain the amount of fluid in tissues and blood vessels.  Maintaining the right amount of fluids and chemicals in the body.  A small amount of kidney damage may not cause problems, but a large amount of damage may make it hard or impossible for the kidneys to work the way they should. If steps are not taken to slow down kidney damage or to stop it from getting worse, the kidneys may stop working permanently (end-stage renal disease or ESRD). Most of the time, CKD does not go away, but it can often be controlled. People who have CKD are usually able to live normal lives. What are the causes? The most common causes of  this condition are diabetes and high blood pressure (hypertension). Other causes include:  Heart and blood vessel (cardiovascular) disease.  Kidney diseases, such as: ? Glomerulonephritis. ? Interstitial nephritis. ? Polycystic kidney disease. ? Renal vascular disease.  Diseases that affect the immune system.  Genetic diseases.  Medicines that damage the kidneys, such as anti-inflammatory medicines.  Being around or being in contact with poisonous (toxic) substances.  A kidney or urinary infection that occurs again and again (recurs).  Vasculitis. This is swelling or inflammation of the blood vessels.  A problem with urine flow that may be caused by: ? Cancer. ? Having kidney stones more than one time. ? An enlarged prostate, in males.  What increases the risk? You are more likely to develop this condition if you:  Are older than age 60.  Are female.  Are African-American, Hispanic, Asian, Pacific Islander, or American Indian.  Are a current or former smoker.  Are obese.  Have a family history of kidney disease or failure.  Often take medicines that are damaging to the kidneys.  What are the signs or symptoms? Symptoms of this condition include:  Swelling (edema) of the face, legs, ankles, or feet.  Tiredness (lethargy) and having less energy.  Nausea or vomiting.  Confusion or trouble concentrating.  Problems with urination, such as: ? Painful or burning feeling during urination. ? Decreased urine production. ? Frequent urination, especially at night. ? Bloody urine.    Muscle twitches and cramps, especially in the legs.  Shortness of breath.  Weakness.  Loss of appetite.  Metallic taste in the mouth.  Trouble sleeping.  Dry, itchy skin.  A low blood count (anemia).  Pale lining of the eyelids and surface of the eye (conjunctiva).  Symptoms develop slowly and may not be obvious until the kidney damage becomes severe. It is possible to have  kidney disease for years without having any symptoms. How is this diagnosed? This condition may be diagnosed based on:  Blood tests.  Urine tests.  Imaging tests, such as an ultrasound or CT scan.  A test in which a sample of tissue is removed from the kidneys to be examined under a microscope (kidney biopsy).  These test results will help your health care provider determine how serious the CKD is. How is this treated? There is no cure for most cases of this condition, but treatment usually relieves symptoms and prevents or slows the progression of the disease. Treatment may include:  Making diet changes, which may require you to avoid alcohol, salty foods (sodium), and foods that are high in potassium, calcium, and protein.  Medicines: ? To lower blood pressure. ? To control blood glucose. ? To relieve anemia. ? To relieve swelling. ? To protect your bones. ? To improve the balance of electrolytes in your blood.  Removing toxic waste from the body through types of dialysis, if the kidneys can no longer do their job (kidney failure).  Managing any other conditions that are causing your CKD or making it worse.  Follow these instructions at home: Medicines  Take over-the-counter and prescription medicines only as told by your health care provider. The dose of some medicines that you take may need to be adjusted.  Do not take any new medicines unless approved by your health care provider. Many medicines can worsen your kidney damage.  Do not take any vitamin and mineral supplements unless approved by your health care provider. Many nutritional supplements can worsen your kidney damage. General instructions  Follow your prescribed diet as told by your health care provider.  Do not use any products that contain nicotine or tobacco, such as cigarettes and e-cigarettes. If you need help quitting, ask your health care provider.  Monitor and track your blood pressure at home.  Report changes in your blood pressure as told by your health care provider.  If you are being treated for diabetes, monitor and track your blood sugar (blood glucose) levels as told by your health care provider.  Maintain a healthy weight. If you need help with this, ask your health care provider.  Start or continue an exercise plan. Exercise at least 30 minutes a day, 5 days a week.  Keep your immunizations up to date as told by your health care provider.  Keep all follow-up visits as told by your health care provider. This is important. Where to find more information:  American Association of Kidney Patients: www.aakp.org  National Kidney Foundation: www.kidney.org  American Kidney Fund: www.akfinc.org  Life Options Rehabilitation Program: www.lifeoptions.org and www.kidneyschool.org Contact a health care provider if:  Your symptoms get worse.  You develop new symptoms. Get help right away if:  You develop symptoms of ESRD, which include: ? Headaches. ? Numbness in the hands or feet. ? Easy bruising. ? Frequent hiccups. ? Chest pain. ? Shortness of breath. ? Lack of menstruation, in women.  You have a fever.  You have decreased urine production.  You have pain   or bleeding when you urinate. Summary  Chronic kidney disease (CKD) occurs when the kidneys become damaged slowly over a long period of time.  The most common causes of this condition are diabetes and high blood pressure (hypertension).  There is no cure for most cases of this condition, but treatment usually relieves symptoms and prevents or slows the progression of the disease. Treatment may include a combination of medicines and lifestyle changes. This information is not intended to replace advice given to you by your health care provider. Make sure you discuss any questions you have with your health care provider. Document Released: 05/12/2008 Document Revised: 09/10/2016 Document Reviewed:  09/10/2016 Elsevier Interactive Patient Education  2018 [employer]vier Inc.  

## 2018-07-21 LAB — MICROALBUMIN / CREATININE URINE RATIO
CREATININE, UR: 55.3 mg/dL
Microalbumin, Urine: <3 ug/mL

## 2018-07-21 LAB — BASIC METABOLIC PANEL
BUN / CREAT RATIO: 20 (ref 12–28)
BUN: 29 mg/dL — AB (ref 8–27)
CALCIUM: 9.4 mg/dL (ref 8.7–10.3)
CHLORIDE: 104 mmol/L (ref 96–106)
CO2: 21 mmol/L (ref 20–29)
CREATININE: 1.46 mg/dL — AB (ref 0.57–1.00)
GFR calc non Af Amer: 34 mL/min/{1.73_m2} — ABNORMAL LOW (ref >59)
GFR, EST AFRICAN AMERICAN: 39 mL/min/{1.73_m2} — AB (ref >59)
GLUCOSE: 82 mg/dL (ref 65–99)
Potassium: 3.9 mmol/L (ref 3.5–5.2)
Sodium: 142 mmol/L (ref 134–144)

## 2018-09-06 ENCOUNTER — Other Ambulatory Visit: Payer: Self-pay | Admitting: Family Medicine

## 2018-09-06 DIAGNOSIS — Z1231 Encounter for screening mammogram for malignant neoplasm of breast: Secondary | ICD-10-CM

## 2018-10-06 ENCOUNTER — Ambulatory Visit
Admission: RE | Admit: 2018-10-06 | Discharge: 2018-10-06 | Disposition: A | Discharge status: Home / Self Care | Payer: Medicare Other | Source: Ambulatory Visit | Attending: Family Medicine | Admitting: Family Medicine

## 2018-10-06 DIAGNOSIS — Z1231 Encounter for screening mammogram for malignant neoplasm of breast: Secondary | ICD-10-CM

## 2018-10-10 ENCOUNTER — Other Ambulatory Visit: Payer: Self-pay | Admitting: Family Medicine

## 2018-10-10 DIAGNOSIS — R928 Other abnormal and inconclusive findings on diagnostic imaging of breast: Secondary | ICD-10-CM

## 2018-10-13 ENCOUNTER — Other Ambulatory Visit: Payer: Self-pay | Admitting: Family Medicine

## 2018-10-13 DIAGNOSIS — R928 Other abnormal and inconclusive findings on diagnostic imaging of breast: Secondary | ICD-10-CM

## 2018-10-14 ENCOUNTER — Other Ambulatory Visit: Payer: Self-pay | Admitting: Family Medicine

## 2018-10-14 ENCOUNTER — Ambulatory Visit: Admission: RE | Admit: 2018-10-14 | Payer: Medicare Other | Source: Ambulatory Visit

## 2018-10-14 ENCOUNTER — Telehealth: Payer: Self-pay | Admitting: Family Medicine

## 2018-10-14 ENCOUNTER — Ambulatory Visit
Admission: RE | Admit: 2018-10-14 | Discharge: 2018-10-14 | Disposition: A | Discharge status: Home / Self Care | Payer: Medicare Other | Source: Ambulatory Visit | Attending: Family Medicine | Admitting: Family Medicine

## 2018-10-14 DIAGNOSIS — R928 Other abnormal and inconclusive findings on diagnostic imaging of breast: Secondary | ICD-10-CM

## 2018-10-14 NOTE — Telephone Encounter (Signed)
Called pt. and informed them of Dr. Manya Silvas permanent absence and cancelation of their appt with her. Gave Dr. Manya Silvas new practice and Primary Care at [facility].

## 2018-10-19 ENCOUNTER — Ambulatory Visit: Payer: Medicare Other | Admitting: Family Medicine

## 2018-11-24 ENCOUNTER — Other Ambulatory Visit: Payer: Self-pay

## 2018-11-24 ENCOUNTER — Other Ambulatory Visit: Payer: Self-pay | Admitting: Family Medicine

## 2018-12-16 ENCOUNTER — Other Ambulatory Visit: Payer: Self-pay

## 2018-12-16 DIAGNOSIS — R7302 Impaired glucose tolerance (oral): Secondary | ICD-10-CM

## 2018-12-16 DIAGNOSIS — I10 Essential (primary) hypertension: Secondary | ICD-10-CM

## 2018-12-22 ENCOUNTER — Other Ambulatory Visit: Payer: Self-pay

## 2018-12-22 ENCOUNTER — Ambulatory Visit (INDEPENDENT_AMBULATORY_CARE_PROVIDER_SITE_OTHER): Payer: Medicare Other | Admitting: Family Medicine

## 2018-12-22 ENCOUNTER — Encounter: Payer: Self-pay | Admitting: Family Medicine

## 2018-12-22 ENCOUNTER — Encounter

## 2018-12-22 VITALS — BP 142/72 | HR 88 | Temp 98.5°F | Resp 16 | Ht 65.0 in | Wt 235.4 lb

## 2018-12-22 DIAGNOSIS — N183 Chronic kidney disease, stage 3 unspecified: Secondary | ICD-10-CM

## 2018-12-22 DIAGNOSIS — I10 Essential (primary) hypertension: Secondary | ICD-10-CM

## 2018-12-22 DIAGNOSIS — R7302 Impaired glucose tolerance (oral): Secondary | ICD-10-CM | POA: Diagnosis not present

## 2018-12-22 MED ORDER — DICLOFENAC SODIUM 1 % TD GEL: TRANSDERMAL | 4 refills | Status: DC

## 2018-12-22 MED ORDER — OLMESARTAN MEDOXOMIL-HCTZ 40-25 MG PO TABS: 1.0000 | ORAL_TABLET | Freq: Every day | ORAL | 1 refills | Status: DC

## 2018-12-22 NOTE — Patient Instructions (Addendum)
If you have lab work done today you will be contacted with your lab results within the next 2 weeks.  If you have not heard from [place] then please contact us. The fastest way to get your results is to register for My Chart.   IF you received an x-ray today, you will receive an invoice from [facility]. Please contact [facility] at [phone] with questions or concerns regarding your invoice.   IF you received labwork today, you will receive an invoice from [place]. Please contact [facility] at [phone] with questions or concerns regarding your invoice.   Our billing staff will not be able to assist you with questions regarding bills from these companies.  You will be contacted with the lab results as soon as they are available. The fastest way to get your results is to activate your My Chart account. Instructions are located on the last page of this paperwork. If you have not heard from [place] regarding the results in 2 weeks, please contact this office.     Chronic Kidney Disease, Adult Chronic kidney disease (CKD) occurs when the kidneys become damaged slowly over a long period of time. The kidneys are a pair of organs that do many important jobs in the body, including:  Removing waste and extra fluid from the blood to make urine.  Making hormones that maintain the amount of fluid in tissues and blood vessels.  Maintaining the right amount of fluids and chemicals in the body. A small amount of kidney damage may not cause problems, but a large amount of damage may make it hard or impossible for the kidneys to work the way they should. If steps are not taken to slow down kidney damage or to stop it from getting worse, the kidneys may stop working permanently (end-stage renal disease or ESRD). Most of the time, CKD does not go away, but it can often be controlled. People who have CKD are usually able to live normal lives. What are the causes? The most common causes of  this condition are diabetes and high blood pressure (hypertension). Other causes include:  Heart and blood vessel (cardiovascular) disease.  Kidney diseases, such as: ? Glomerulonephritis. ? Interstitial nephritis. ? Polycystic kidney disease. ? Renal vascular disease.  Diseases that affect the immune system.  Genetic diseases.  Medicines that damage the kidneys, such as anti-inflammatory medicines.  Being around or being in contact with poisonous (toxic) substances.  A kidney or urinary infection that occurs again and again (recurs).  Vasculitis. This is swelling or inflammation of the blood vessels.  A problem with urine flow that may be caused by: ? Cancer. ? Having kidney stones more than one time. ? An enlarged prostate, in males. What increases the risk? You are more likely to develop this condition if you:  Are older than age 15.  Are female.  Are African-American, Hispanic, Asian, [place], or American [place].  Are a current or former smoker.  Are obese.  Have a family history of kidney disease or failure.  Often take medicines that are damaging to the kidneys. What are the signs or symptoms? Symptoms of this condition include:  Swelling (edema) of the face, legs, ankles, or feet.  Tiredness (lethargy) and having less energy.  Nausea or vomiting.  Confusion or trouble concentrating.  Problems with urination, such as: ? Painful or burning feeling during urination. ? Decreased urine production. ? Frequent urination, especially at night. ? Bloody urine.  Muscle twitches and  cramps, especially in the legs.  Shortness of breath.  Weakness.  Loss of appetite.  Metallic taste in the mouth.  Trouble sleeping.  Dry, itchy skin.  A low blood count (anemia).  Pale lining of the eyelids and surface of the eye (conjunctiva). Symptoms develop slowly and may not be obvious until the kidney damage becomes severe. It is possible to have  kidney disease for years without having any symptoms. How is this diagnosed? This condition may be diagnosed based on:  Blood tests.  Urine tests.  Imaging tests, such as an ultrasound or CT scan.  A test in which a sample of tissue is removed from the kidneys to be examined under a microscope (kidney biopsy). These test results will help your health care provider determine how serious the CKD is. How is this treated? There is no cure for most cases of this condition, but treatment usually relieves symptoms and prevents or slows the progression of the disease. Treatment may include:  Making diet changes, which may require you to avoid alcohol, salty foods (sodium), and foods that are high in potassium, calcium, and protein.  Medicines: ? To lower blood pressure. ? To control blood glucose. ? To relieve anemia. ? To relieve swelling. ? To protect your bones. ? To improve the balance of electrolytes in your blood.  Removing toxic waste from the body through types of dialysis, if the kidneys can no longer do their job (kidney failure).  Managing any other conditions that are causing your CKD or making it worse. Follow these instructions at home: Medicines  Take over-the-counter and prescription medicines only as told by your health care provider. The dose of some medicines that you take may need to be adjusted.  Do not take any new medicines unless approved by your health care provider. Many medicines can worsen your kidney damage.  Do not take any vitamin and mineral supplements unless approved by your health care provider. Many nutritional supplements can worsen your kidney damage. General instructions  Follow your prescribed diet as told by your health care provider.  Do not use any products that contain nicotine or tobacco, such as cigarettes and e-cigarettes. If you need help quitting, ask your health care provider.  Monitor and track your blood pressure at home. Report  changes in your blood pressure as told by your health care provider.  If you are being treated for diabetes, monitor and track your blood sugar (blood glucose) levels as told by your health care provider.  Maintain a healthy weight. If you need help with this, ask your health care provider.  Start or continue an exercise plan. Exercise at least 30 minutes a day, 5 days a week.  Keep your immunizations up to date as told by your health care provider.  Keep all follow-up visits as told by your health care provider. This is important. Where to find more information  American Association of Kidney Patients: BombTimer.gl  National Kidney Foundation: www.kidney.[place]: https://mathis.com/  Life Options Rehabilitation Program: www.lifeoptions.org and www.kidneyschool.org Contact a health care provider if:  Your symptoms get worse.  You develop new symptoms. Get help right away if:  You develop symptoms of ESRD, which include: ? Headaches. ? Numbness in the hands or feet. ? Easy bruising. ? Frequent hiccups. ? Chest pain. ? Shortness of breath. ? Lack of menstruation, in women.  You have a fever.  You have decreased urine production.  You have pain or bleeding when you urinate. Summary  Chronic kidney disease (CKD) occurs when the kidneys become damaged slowly over a long period of time.  The most common causes of this condition are diabetes and high blood pressure (hypertension).  There is no cure for most cases of this condition, but treatment usually relieves symptoms and prevents or slows the progression of the disease. Treatment may include a combination of medicines and lifestyle changes. This information is not intended to replace advice given to you by your health care provider. Make sure you discuss any questions you have with your health care provider. Document Released: 05/12/2008 Document Revised: 09/10/2016 Document Reviewed: 09/10/2016 Elsevier  Interactive Patient Education  2019 [employer].

## 2018-12-22 NOTE — Progress Notes (Signed)
Established Patient Office Visit  Subjective:  Patient ID: Tammy Mccullough, female    DOB: Mar 19, 1941  Age: 78 y.o. MRN: [id number]  CC:  Chief Complaint  Patient presents with  . Transitions Of Care    former [place] pt.  Per pt no concerns    HPI Tammy Mccullough presents for   Hypertension: Patient here for follow-up of elevated blood pressure. She is not exercising and is adherent to low salt diet.  Blood pressure is well controlled at home. Cardiac symptoms none. Patient denies chest pain, chest pressure/discomfort, exertional chest pressure/discomfort, irregular heart beat, lower extremity edema and near-syncope.  Cardiovascular risk factors: hypertension. Use of agents associated with hypertension: none. History of target organ damage: chronic kidney disease.   Chronic Kidney Disease Patient was previously on meloxicam for arthritis  She has stopped taking the meloxicam and takes tylenol She is hydrating and compliant with her bp mes  Component     Latest Ref Rng & Units 10/20/2016 04/27/2017 10/25/2017 07/20/2018  Creatinine     0.57 - 1.00 mg/dL 1.21 (H) 1.25 (H) 1.60 (H) 1.46 (H)    Arthritis Pt reports that she is now using voltaren gel She states that the gel and tylenol helps her pain She denies any further use of meloxicam She walks when she can.   Past Medical History:  Diagnosis Date  . Arthritis    B knees.  . Cataract   . Glaucoma   . Glucose intolerance (impaired glucose tolerance)   . Hypertension   . Renal insufficiency     Past Surgical History:  Procedure Laterality Date  . CATARACT EXTRACTION Right 07-2014  . CATARACT EXTRACTION Left 01/16/2016  . COLONOSCOPY     10 +yrs ago in [place], normal per pt  . VAGINAL DELIVERY     x2    Family History  Problem Relation Age of Onset  . Diabetes Mother   . Heart disease Mother        unsure  . COPD Father   . Diabetes Sister   . Hypertension Sister   . Kidney disease Sister   .  Diabetes Brother   . Diabetes Brother   . Colon cancer Neg Hx   . Rectal cancer Neg Hx   . Stomach cancer Neg Hx     Social History   Socioeconomic History  . Marital status: Widowed    Spouse name: Not on file  . Number of children: 2  . Years of education: Not on file  . Highest education level: Not on file  Occupational History  . Occupation: retired  Scientific laboratory technician  . Financial resource strain: Not on file  . Food insecurity:    Worry: Not on file    Inability: Not on file  . Transportation needs:    Medical: Not on file    Non-medical: Not on file  Tobacco Use  . Smoking status: Never Smoker  . Smokeless tobacco: Never Used  Substance and Sexual Activity  . Alcohol use: Yes    Alcohol/week: 6.0 standard drinks    Types: 6 Standard drinks or equivalent per week    Comment: 2-3  . Drug use: No  . Sexual activity: Not Currently  Lifestyle  . Physical activity:    Days per week: Not on file    Minutes per session: Not on file  . Stress: Not on file  Relationships  . Social connections:    Talks on phone: Not on file  Gets together: Not on file    Attends religious service: Not on file    Active member of club or organization: Not on file    Attends meetings of clubs or organizations: Not on file    Relationship status: Not on file  . Intimate partner violence:    Fear of current or ex partner: Not on file    Emotionally abused: Not on file    Physically abused: Not on file    Forced sexual activity: Not on file  Other Topics Concern  . Not on file  Social History Narrative   Marital status: widowed since 2010 after 41 years of marriage.  Not dating but would like to be.      Children:  2 Children; no grandchildren; 2 adopted grandchildren.  One son is disabled; patient supports; son is obese.      Living: alone with 1 cat.      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      Tobacco: never       Alcohol: weekends liquor; some during the week.  3-5 cups per week.      Drugs: none      Exercise: water aerobics three days per week.  [employer].      Seatbelt: 100%;      Guns: none      ADLs:: independent with ADLs.   Drives. No assistant devices.      Advanced Directives: yes; healthcare POA:  Older brother [name]);  FULL CODE but no prolonged measures.      Outpatient Medications Prior to Visit  Medication Sig Dispense Refill  . acetaminophen (TYLENOL) 500 MG tablet Take 500 mg by mouth every 8 (eight) hours as needed.    Marland Kitchen aspirin 81 MG tablet Take 81 mg by mouth daily. Reported on 02/15/2016    . AZOPT 1 % ophthalmic suspension     . ZIOPTAN 0.0015 % SOLN     . diclofenac sodium (VOLTAREN) 1 % GEL APPLY 2 GRAMS FOUR TIMES DAILY FOR KNEE OSTEOARTHRITIS 100 g 2  . olmesartan-hydrochlorothiazide (BENICAR HCT) 40-25 MG tablet Take 1 tablet by mouth daily. 90 tablet 1  . meloxicam (MOBIC) 7.5 MG tablet TAKE 1 TABLET(7.5 MG) BY MOUTH DAILY (Patient not taking: Reported on 12/22/2018) 90 tablet 0   No facility-administered medications prior to visit.     No Known Allergies  ROS Review of Systems Review of Systems  Constitutional: Negative for activity change, appetite change, chills and fever.  HENT: Negative for congestion, nosebleeds, trouble swallowing and voice change.   Respiratory: Negative for cough, shortness of breath and wheezing.   Gastrointestinal: Negative for diarrhea, nausea and vomiting.  Genitourinary: Negative for difficulty urinating, dysuria, flank pain and hematuria.  Musculoskeletal: Negative for back pain, joint swelling and neck pain.  Neurological: Negative for dizziness, speech difficulty, light-headedness and numbness.  See HPI. All other review of systems negative.     Objective:    Physical Exam  BP (!) 142/72 (BP Location: Left Arm, Patient Position: Sitting, Cuff Size: Large)   Pulse 88   Temp 98.5 F (36.9 C) (Oral)   Resp 16   Ht  5\' 5"  (1.651 m)   Wt 235 lb 6.4 oz (106.8 kg)   SpO2 97%   BMI 39.17 kg/m  Wt Readings from Last 3 Encounters:  12/22/18 235 lb 6.4 oz (106.8 kg)  07/20/18 236 lb (107 kg)  05/18/18 238  lb (108 kg)   Physical Exam  Constitutional: Oriented to person, place, and time. Appears well-developed and well-nourished.  HENT:  Head: Normocephalic and atraumatic.  Eyes: Conjunctivae and EOM are normal.  Cardiovascular: Normal rate, regular rhythm, normal heart sounds and intact distal pulses.  No murmur heard. Pulmonary/Chest: Effort normal and breath sounds normal. No stridor. No respiratory distress. Has no wheezes.  Abdomen: nondistended, normoactive bs, soft, nontender Neurological: Is alert and oriented to person, place, and time.  Skin: Skin is warm. Capillary refill takes less than 2 seconds.  Psychiatric: Has a normal mood and affect. Behavior is normal. Judgment and thought content normal.    There are no preventive care reminders to display for this patient.  There are no preventive care reminders to display for this patient.  No results found for: TSH Lab Results  Component Value Date   WBC 4.2 10/25/2017   HGB 12.6 10/25/2017   HCT 38.1 10/25/2017   MCV 96 10/25/2017   PLT 200 10/25/2017   Lab Results  Component Value Date   NA 142 07/20/2018   K 3.9 07/20/2018   CO2 21 07/20/2018   GLUCOSE 82 07/20/2018   BUN 29 (H) 07/20/2018   CREATININE 1.46 (H) 07/20/2018   BILITOT 1.1 10/25/2017   ALKPHOS 59 10/25/2017   AST 23 10/25/2017   ALT 14 10/25/2017   PROT 7.1 10/25/2017   ALBUMIN 4.1 10/25/2017   CALCIUM 9.4 07/20/2018   Lab Results  Component Value Date   CHOL 204 (H) 10/25/2017   Lab Results  Component Value Date   HDL 84 10/25/2017   Lab Results  Component Value Date   LDLCALC 93 10/25/2017   Lab Results  Component Value Date   TRIG 135 10/25/2017   Lab Results  Component Value Date   CHOLHDL 2.4 10/25/2017   Lab Results  Component Value  Date   HGBA1C 5.4 10/25/2017      Assessment & Plan:   Problem List Items Addressed This Visit      Cardiovascular and Mediastinum   HTN (hypertension)  -  Discussed bp mgmt Advised DASH diet   Relevant Medications   olmesartan-hydrochlorothiazide (BENICAR HCT) 40-25 MG tablet     Endocrine   Glucose intolerance (impaired glucose tolerance)-  Reviewed glucose    Other Visit Diagnoses    CKD (chronic kidney disease) stage 3, GFR 30-59 ml/min (HCC)    -  Primary Discussed her kidney function Discussed screen for other causes of deficiency due to kidney disease    Relevant Orders   VITAMIN D 25 Hydroxy (Vit-D Deficiency, Fractures)   CBC       Arthritis -  Avoid nsaids, refilled voltaren    Meds ordered this encounter  Medications  . olmesartan-hydrochlorothiazide (BENICAR HCT) 40-25 MG tablet    Sig: Take 1 tablet by mouth daily.    Dispense:  90 tablet    Refill:  1  . diclofenac sodium (VOLTAREN) 1 % GEL    Sig: APPLY 2 GRAMS FOUR TIMES DAILY FOR KNEE OSTEOARTHRITIS    Dispense:  100 g    Refill:  4    Follow-up: Return in about 3 months (around 03/24/2019) for kidney and hypertension .    Forrest Moron, MD

## 2018-12-23 LAB — CBC
Hematocrit: 37.6 % (ref 34.0–46.6)
Hemoglobin: 12.2 g/dL (ref 11.1–15.9)
MCH: 30.5 pg (ref 26.6–33.0)
MCHC: 32.4 g/dL (ref 31.5–35.7)
MCV: 94 fL (ref 79–97)
Platelets: 189 10*3/uL (ref 150–450)
RBC: 4 x10E6/uL (ref 3.77–5.28)
RDW: 13 % (ref 11.7–15.4)
WBC: 4.7 10*3/uL (ref 3.4–10.8)

## 2018-12-23 LAB — COMPREHENSIVE METABOLIC PANEL
ALT: 13 IU/L (ref 0–32)
AST: 23 IU/L (ref 0–40)
Albumin/Globulin Ratio: 1.6 (ref 1.2–2.2)
Albumin: 4.4 g/dL (ref 3.7–4.7)
Alkaline Phosphatase: 63 IU/L (ref 39–117)
BUN/Creatinine Ratio: 18 (ref 12–28)
BUN: 27 mg/dL (ref 8–27)
Bilirubin Total: 0.9 mg/dL (ref 0.0–1.2)
CO2: 20 mmol/L (ref 20–29)
Calcium: 9.8 mg/dL (ref 8.7–10.3)
Chloride: 104 mmol/L (ref 96–106)
Creatinine, Ser: 1.49 mg/dL — ABNORMAL HIGH (ref 0.57–1.00)
GFR calc Af Amer: 38 mL/min/{1.73_m2} — ABNORMAL LOW (ref >59)
GFR calc non Af Amer: 33 mL/min/{1.73_m2} — ABNORMAL LOW (ref >59)
Globulin, Total: 2.7 g/dL (ref 1.5–4.5)
Glucose: 104 mg/dL — ABNORMAL HIGH (ref 65–99)
Potassium: 4.2 mmol/L (ref 3.5–5.2)
Sodium: 140 mmol/L (ref 134–144)
Total Protein: 7.1 g/dL (ref 6.0–8.5)

## 2018-12-23 LAB — LIPID PANEL
Chol/HDL Ratio: 2.4 ratio (ref 0.0–4.4)
Cholesterol, Total: 220 mg/dL — ABNORMAL HIGH (ref 100–199)
HDL: 93 mg/dL (ref >39)
LDL Calculated: 110 mg/dL — ABNORMAL HIGH (ref 0–99)
Triglycerides: 87 mg/dL (ref 0–149)
VLDL Cholesterol Cal: 17 mg/dL (ref 5–40)

## 2018-12-23 LAB — VITAMIN D 25 HYDROXY (VIT D DEFICIENCY, FRACTURES): Vit D, 25-Hydroxy: 25.2 ng/mL — ABNORMAL LOW (ref 30.0–100.0)

## 2019-05-01 ENCOUNTER — Ambulatory Visit (INDEPENDENT_AMBULATORY_CARE_PROVIDER_SITE_OTHER): Payer: Medicare Other | Admitting: Family Medicine

## 2019-05-01 ENCOUNTER — Other Ambulatory Visit: Payer: Self-pay

## 2019-05-01 ENCOUNTER — Encounter: Payer: Self-pay | Admitting: Family Medicine

## 2019-05-01 VITALS — BP 130/68 | HR 86 | Temp 98.1°F | Resp 17 | Ht 65.0 in | Wt 239.2 lb

## 2019-05-01 DIAGNOSIS — M25562 Pain in left knee: Secondary | ICD-10-CM | POA: Diagnosis not present

## 2019-05-01 DIAGNOSIS — M25561 Pain in right knee: Secondary | ICD-10-CM | POA: Diagnosis not present

## 2019-05-01 DIAGNOSIS — I10 Essential (primary) hypertension: Secondary | ICD-10-CM | POA: Diagnosis not present

## 2019-05-01 DIAGNOSIS — N183 Chronic kidney disease, stage 3 unspecified: Secondary | ICD-10-CM

## 2019-05-01 NOTE — Progress Notes (Signed)
Established Patient Office Visit  Subjective:  Patient ID: Tammy Mccullough, female    DOB: 1940/12/28  Age: 78 y.o. MRN: [id number]  CC:  Chief Complaint  Patient presents with  . f/u medical conditions    pt would like referral for PT for knee    HPI Tammy Mccullough presents for   She has a history of left knee pain  It is a chronic pain but this pain started 3 weeks or so and the voltaren is not helping She has never had a steroid injection in her knee on the left She has arthritis in her shoulder on the right side and states that it helped her pain when she had PT   She denies buckling, locking, swelling It is just the pain  The pain is 8/10  EXAM: LEFT KNEE - COMPLETE 4+ VIEW  COMPARISON:  None.  FINDINGS: Moderate tricompartment degenerative changes within the left knee. Small joint effusion. No acute bony abnormality. Specifically, no fracture, subluxation, or dislocation. Soft tissues are intact.  IMPRESSION: Moderate degenerative changes with small joint effusion. No acute bony abnormality.   Electronically Signed   By: Rolm Baptise M.D.   On: 04/27/2017 09:45  Hypertension: Patient here for follow-up of elevated blood pressure. She is not exercising due to knee pain and is adherent to low salt diet.  Blood pressure is well controlled at home. Cardiac symptoms none. Patient denies chest pressure/discomfort, claudication, exertional chest pressure/discomfort and lower extremity edema.  Cardiovascular risk factors: dyslipidemia, hypertension and obesity (BMI >= 30 kg/m2). Use of agents associated with hypertension: none.  BP Readings from Last 3 Encounters:  05/01/19 130/68  12/22/18 (!) 142/72  07/20/18 138/80     Past Medical History:  Diagnosis Date  . Arthritis    B knees.  . Cataract   . Glaucoma   . Glucose intolerance (impaired glucose tolerance)   . Hypertension   . Renal insufficiency     Past Surgical History:   Procedure Laterality Date  . CATARACT EXTRACTION Right 07-2014  . CATARACT EXTRACTION Left 01/16/2016  . COLONOSCOPY     10 +yrs ago in [place], normal per pt  . VAGINAL DELIVERY     x2    Family History  Problem Relation Age of Onset  . Diabetes Mother   . Heart disease Mother        unsure  . COPD Father   . Diabetes Sister   . Hypertension Sister   . Kidney disease Sister   . Diabetes Brother   . Diabetes Brother   . Colon cancer Neg Hx   . Rectal cancer Neg Hx   . Stomach cancer Neg Hx     Social History   Socioeconomic History  . Marital status: Widowed    Spouse name: Not on file  . Number of children: 2  . Years of education: Not on file  . Highest education level: Not on file  Occupational History  . Occupation: retired  Scientific laboratory technician  . Financial resource strain: Not on file  . Food insecurity    Worry: Not on file    Inability: Not on file  . Transportation needs    Medical: Not on file    Non-medical: Not on file  Tobacco Use  . Smoking status: Never Smoker  . Smokeless tobacco: Never Used  Substance and Sexual Activity  . Alcohol use: Yes    Alcohol/week: 6.0 standard drinks    Types: 6 Standard drinks  or equivalent per week    Comment: 2-3  . Drug use: No  . Sexual activity: Not Currently  Lifestyle  . Physical activity    Days per week: Not on file    Minutes per session: Not on file  . Stress: Not on file  Relationships  . Social Herbalist on phone: Not on file    Gets together: Not on file    Attends religious service: Not on file    Active member of club or organization: Not on file    Attends meetings of clubs or organizations: Not on file    Relationship status: Not on file  . Intimate partner violence    Fear of current or ex partner: Not on file    Emotionally abused: Not on file    Physically abused: Not on file    Forced sexual activity: Not on file  Other Topics Concern  . Not on file  Social History Narrative    Marital status: widowed since 2010 after 38 years of marriage.  Not dating but would like to be.      Children:  2 Children; no grandchildren; 2 adopted grandchildren.  One son is disabled; patient supports; son is obese.      Living: alone with 1 cat.      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      Tobacco: never      Alcohol: weekends liquor; some during the week.  3-5 cups per week.      Drugs: none      Exercise: water aerobics three days per week.  [employer].      Seatbelt: 100%;      Guns: none      ADLs:: independent with ADLs.   Drives. No assistant devices.      Advanced Directives: yes; healthcare POA:  Older brother [name]);  FULL CODE but no prolonged measures.      Outpatient Medications Prior to Visit  Medication Sig Dispense Refill  . acetaminophen (TYLENOL) 500 MG tablet Take 500 mg by mouth every 8 (eight) hours as needed.    Marland Kitchen aspirin 81 MG tablet Take 81 mg by mouth daily. Reported on 02/15/2016    . AZOPT 1 % ophthalmic suspension     . 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 olmesartan-hydrochlorothiazide (BENICAR HCT) 40-25 MG tablet Take 1 tablet by mouth daily. 90 tablet 1  . ZIOPTAN 0.0015 % SOLN      No facility-administered medications prior to visit.     No Known Allergies  ROS Review of Systems Review of Systems  Constitutional: Negative for activity change, appetite change, chills and fever.  HENT: Negative for congestion, nosebleeds, trouble swallowing and voice change.   Respiratory: Negative for cough, shortness of breath and wheezing.   Gastrointestinal: Negative for diarrhea, nausea and vomiting.  Genitourinary: Negative for difficulty urinating, dysuria, flank pain and hematuria.  Musculoskeletal: Negative for back pain, joint swelling and neck pain.  Neurological: Negative for dizziness, speech difficulty,  light-headedness and numbness.  See HPI. All other review of systems negative.     Objective:    Physical Exam  BP 130/68 (BP Location: Left Arm, Patient Position: Sitting, Cuff Size: Large)   Pulse 86   Temp 98.1 F (36.7 C) (Oral)   Resp 17   Ht 5\' 5"  (  1.651 m)   Wt 239 lb 3.2 oz (108.5 kg)   SpO2 100%   BMI 39.80 kg/m  Wt Readings from Last 3 Encounters:  05/01/19 239 lb 3.2 oz (108.5 kg)  12/22/18 235 lb 6.4 oz (106.8 kg)  07/20/18 236 lb (107 kg)    Physical Exam  Constitutional: Oriented to person, place, and time. Appears well-developed and well-nourished.  HENT:  Head: Normocephalic and atraumatic.  Eyes: Conjunctivae and EOM are normal.  Cardiovascular: Normal rate, regular rhythm, normal heart sounds and intact distal pulses.  No murmur heard. Pulmonary/Chest: Effort normal and breath sounds normal. No stridor. No respiratory distress. Has no wheezes.  Neurological: Is alert and oriented to person, place, and time.  Skin: Skin is warm. Capillary refill takes less than 2 seconds.  Psychiatric: Has a normal mood and affect. Behavior is normal. Judgment and thought content normal.   BILATERAL KNEE WITH JOINT LINE TENDERNESS LATERAL AND MEDIAL NO EFFUSION +CREPITUS BILATERALLY, NO PATELLAR APPREHENSION, NO EVIDENCE OF TENDON RUPTURE   Health Maintenance Due  Topic Date Due  . INFLUENZA VACCINE  03/18/2019    There are no preventive care reminders to display for this patient.  No results found for: TSH Lab Results  Component Value Date   WBC 4.7 12/22/2018   HGB 12.2 12/22/2018   HCT 37.6 12/22/2018   MCV 94 12/22/2018   PLT 189 12/22/2018   Lab Results  Component Value Date   NA 140 12/22/2018   K 4.2 12/22/2018   CO2 20 12/22/2018   GLUCOSE 104 (H) 12/22/2018   BUN 27 12/22/2018   CREATININE 1.49 (H) 12/22/2018   BILITOT 0.9 12/22/2018   ALKPHOS 63 12/22/2018   AST 23 12/22/2018   ALT 13 12/22/2018   PROT 7.1 12/22/2018   ALBUMIN 4.4  12/22/2018   CALCIUM 9.8 12/22/2018   Lab Results  Component Value Date   CHOL 220 (H) 12/22/2018   Lab Results  Component Value Date   HDL 93 12/22/2018   Lab Results  Component Value Date   LDLCALC 110 (H) 12/22/2018   Lab Results  Component Value Date   TRIG 87 12/22/2018   Lab Results  Component Value Date   CHOLHDL 2.4 12/22/2018   Lab Results  Component Value Date   HGBA1C 5.4 10/25/2017      Assessment & Plan:   Problem List Items Addressed This Visit      Cardiovascular and Mediastinum   HTN (hypertension) - Patient's blood pressure is at goal of 139/89 or less. Condition is stable. Continue current medications and treatment plan. I recommend that you exercise for 30-45 minutes 5 days a week. I also recommend a balanced diet with fruits and vegetables every day, lean meats, and little fried foods. The DASH diet (you can find this online) is a good example of this.     Other Visit Diagnoses    Arthralgia of knee, bilateral    -  Primary Referral placed for PT  CKD stage 3- stable Since pt has elevated creatinine avoid NSAIDs, continue voltaren and tylenol       No orders of the defined types were placed in this encounter.   Follow-up: No follow-ups on file.    Forrest Moron, MD

## 2019-05-01 NOTE — Patient Instructions (Addendum)
If you have lab work done today you will be contacted with your lab results within the next 2 weeks.  If you have not heard from [place] then please contact us. The fastest way to get your results is to register for My Chart.   IF you received an x-ray today, you will receive an invoice from [facility]. Please contact [facility] at [phone] with questions or concerns regarding your invoice.   IF you received labwork today, you will receive an invoice from [facility]. Please contact [facility] at [phone] with questions or concerns regarding your invoice.   Our billing staff will not be able to assist you with questions regarding bills from these companies.  You will be contacted with the lab results as soon as they are available. The fastest way to get your results is to activate your My Chart account. Instructions are located on the last page of this paperwork. If you have not heard from [place] regarding the results in 2 weeks, please contact this office.     Chronic Knee Pain, Adult Chronic knee pain is pain in one or both knees that lasts longer than 3 months. Symptoms of chronic knee pain may include swelling, stiffness, and discomfort. Age-related wear and tear (osteoarthritis) of the knee joint is the most common cause of chronic knee pain. Other possible causes include:  A long-term immune-related disease that causes inflammation of the knee (rheumatoid arthritis). This usually affects both knees.  Inflammatory arthritis, such as gout or pseudogout.  An injury to the knee that causes arthritis.  An injury to the knee that damages the ligaments. Ligaments are strong tissues that connect bones to each other.  Runner's knee or pain behind the kneecap. Treatment for chronic knee pain depends on the cause. The main treatments for chronic knee pain are physical therapy and weight loss. This condition may also be treated with medicines, injections, a knee sleeve or  brace, and by using crutches. Rest, ice, compression (pressure), and elevation (RICE) therapy may also be recommended. Follow these instructions at home: If you have a knee sleeve or brace:   Wear it as told by your health care provider. Remove it only as told by your health care provider.  Loosen it if your toes tingle, become numb, or turn cold and blue.  Keep it clean.  If the sleeve or brace is not waterproof: ? Do not let it get wet. ? Remove it if allowed by your health care provider, or cover it with a watertight covering when you take a bath or a shower. Managing pain, stiffness, and swelling      If directed, apply heat to the affected area as often as told by your health care provider. Use the heat source that your health care provider recommends, such as a moist heat pack or a heating pad. ? If you have a removable sleeve or brace, remove it as told by your health care provider. ? Place a towel between your skin and the heat source. ? Leave the heat on for 20-30 minutes. ? Remove the heat if your skin turns bright red. This is especially important if you are unable to feel pain, heat, or cold. You may have a greater risk of getting burned.  If directed, put ice on the affected area. ? If you have a removable sleeve or brace, remove it as told by your health care provider. ? Put ice in a plastic bag. ? Place a towel between  your skin and the bag. ? Leave the ice on for 20 minutes, 2-3 times a day.  Move your toes often to reduce stiffness and swelling.  Raise (elevate) the injured area above the level of your heart while you are sitting or lying down. Activity  Avoid activities where both feet leave the ground at the same time (high-impact activities). Examples are running, jumping rope, and doing jumping jacks.  Return to your normal activities as told by your health care provider. Ask your health care provider what activities are safe for you.  Follow the exercise  plan that your health care provider designed for you. Your health care provider may suggest that you: ? Avoid activities that make knee pain worse. This may require you to change your exercise routines, sport participation, or job duties. ? Wear shoes with cushioned soles. ? Avoid high-impact activities or sports that require running and sudden changes in direction. ? Do physical therapy as told by your health care provider. Physical therapy is planned to match your needs and abilities. It may include exercises for strength, flexibility, stability, and endurance. ? Do exercises that increase balance and strength, such as tai chi and yoga.  Do not use the injured limb to support your body weight until your health care provider says that you can. Use crutches, a cane, or a walker, as told by your health care provider. General instructions  Take over-the-counter and prescription medicines only as told by your health care provider.  Lose weight if you are overweight. Losing even a little weight can reduce knee pain. Ask your health care provider what your ideal weight is, and how to safely lose extra weight. A food expert (dietitian) may be able to help you plan your meals.  Do not use any products that contain nicotine or tobacco, such as cigarettes, e-cigarettes, and chewing tobacco. These can delay healing. If you need help quitting, ask your health care provider.  Keep all follow-up visits as told by your health care provider. This is important. Contact a health care provider if:  You have knee pain that is not getting better or gets worse.  You are unable to do your physical therapy exercises due to knee pain. Get help right away if:  Your knee swells and the swelling becomes worse.  You cannot move your knee.  You have severe knee pain. Summary  Knee pain that lasts more than 3 months is considered chronic knee pain.  The main treatments for chronic knee pain are physical therapy  and weight loss. You may also need to take medicines, wear a knee sleeve or brace, use crutches, and apply ice or heat.  Losing even a little weight can reduce knee pain. Ask your health care provider what your ideal weight is, and how to safely lose extra weight. A food expert (dietitian) may be able to help you plan your meals.  Work with a physical therapist to make a safe exercise program, as told by your health care provider. This information is not intended to replace advice given to you by your health care provider. Make sure you discuss any questions you have with your health care provider. Document Released: 10/13/2018 Document Revised: 10/13/2018 Document Reviewed: 10/13/2018 Elsevier Patient Education  [place].  Chronic Knee Pain, Adult Chronic knee pain is pain in one or both knees that lasts longer than 3 months. Symptoms of chronic knee pain may include swelling, stiffness, and discomfort. Age-related wear and tear (osteoarthritis) of the  knee joint is the most common cause of chronic knee pain. Other possible causes include:  A long-term immune-related disease that causes inflammation of the knee (rheumatoid arthritis). This usually affects both knees.  Inflammatory arthritis, such as gout or pseudogout.  An injury to the knee that causes arthritis.  An injury to the knee that damages the ligaments. Ligaments are strong tissues that connect bones to each other.  Runner's knee or pain behind the kneecap. Treatment for chronic knee pain depends on the cause. The main treatments for chronic knee pain are physical therapy and weight loss. This condition may also be treated with medicines, injections, a knee sleeve or brace, and by using crutches. Rest, ice, compression (pressure), and elevation (RICE) therapy may also be recommended. Follow these instructions at home: If you have a knee sleeve or brace:   Wear it as told by your health care provider. Remove it only as  told by your health care provider.  Loosen it if your toes tingle, become numb, or turn cold and blue.  Keep it clean.  If the sleeve or brace is not waterproof: ? Do not let it get wet. ? Remove it if allowed by your health care provider, or cover it with a watertight covering when you take a bath or a shower. Managing pain, stiffness, and swelling      If directed, apply heat to the affected area as often as told by your health care provider. Use the heat source that your health care provider recommends, such as a moist heat pack or a heating pad. ? If you have a removable sleeve or brace, remove it as told by your health care provider. ? Place a towel between your skin and the heat source. ? Leave the heat on for 20-30 minutes. ? Remove the heat if your skin turns bright red. This is especially important if you are unable to feel pain, heat, or cold. You may have a greater risk of getting burned.  If directed, put ice on the affected area. ? If you have a removable sleeve or brace, remove it as told by your health care provider. ? Put ice in a plastic bag. ? Place a towel between your skin and the bag. ? Leave the ice on for 20 minutes, 2-3 times a day.  Move your toes often to reduce stiffness and swelling.  Raise (elevate) the injured area above the level of your heart while you are sitting or lying down. Activity  Avoid activities where both feet leave the ground at the same time (high-impact activities). Examples are running, jumping rope, and doing jumping jacks.  Return to your normal activities as told by your health care provider. Ask your health care provider what activities are safe for you.  Follow the exercise plan that your health care provider designed for you. Your health care provider may suggest that you: ? Avoid activities that make knee pain worse. This may require you to change your exercise routines, sport participation, or job duties. ? Wear shoes with  cushioned soles. ? Avoid high-impact activities or sports that require running and sudden changes in direction. ? Do physical therapy as told by your health care provider. Physical therapy is planned to match your needs and abilities. It may include exercises for strength, flexibility, stability, and endurance. ? Do exercises that increase balance and strength, such as tai chi and yoga.  Do not use the injured limb to support your body weight until your health care  provider says that you can. Use crutches, a cane, or a walker, as told by your health care provider. General instructions  Take over-the-counter and prescription medicines only as told by your health care provider.  Lose weight if you are overweight. Losing even a little weight can reduce knee pain. Ask your health care provider what your ideal weight is, and how to safely lose extra weight. A food expert (dietitian) may be able to help you plan your meals.  Do not use any products that contain nicotine or tobacco, such as cigarettes, e-cigarettes, and chewing tobacco. These can delay healing. If you need help quitting, ask your health care provider.  Keep all follow-up visits as told by your health care provider. This is important. Contact a health care provider if:  You have knee pain that is not getting better or gets worse.  You are unable to do your physical therapy exercises due to knee pain. Get help right away if:  Your knee swells and the swelling becomes worse.  You cannot move your knee.  You have severe knee pain. Summary  Knee pain that lasts more than 3 months is considered chronic knee pain.  The main treatments for chronic knee pain are physical therapy and weight loss. You may also need to take medicines, wear a knee sleeve or brace, use crutches, and apply ice or heat.  Losing even a little weight can reduce knee pain. Ask your health care provider what your ideal weight is, and how to safely lose extra  weight. A food expert (dietitian) may be able to help you plan your meals.  Work with a physical therapist to make a safe exercise program, as told by your health care provider. This information is not intended to replace advice given to you by your health care provider. Make sure you discuss any questions you have with your health care provider. Document Released: 10/13/2018 Document Revised: 10/13/2018 Document Reviewed: 10/13/2018 Elsevier Patient Education  2020 [employer].

## 2019-06-02 ENCOUNTER — Encounter: Payer: Self-pay | Admitting: Family Medicine

## 2019-06-02 ENCOUNTER — Ambulatory Visit (INDEPENDENT_AMBULATORY_CARE_PROVIDER_SITE_OTHER): Payer: Medicare Other | Admitting: Family Medicine

## 2019-06-02 ENCOUNTER — Other Ambulatory Visit: Payer: Self-pay

## 2019-06-02 VITALS — BP 148/72 | HR 86 | Temp 98.6°F | Ht 65.0 in | Wt 240.0 lb

## 2019-06-02 DIAGNOSIS — I10 Essential (primary) hypertension: Secondary | ICD-10-CM | POA: Diagnosis not present

## 2019-06-02 DIAGNOSIS — M25561 Pain in right knee: Secondary | ICD-10-CM

## 2019-06-02 DIAGNOSIS — Z23 Encounter for immunization: Secondary | ICD-10-CM | POA: Diagnosis not present

## 2019-06-02 DIAGNOSIS — M25562 Pain in left knee: Secondary | ICD-10-CM | POA: Diagnosis not present

## 2019-06-02 MED ORDER — OLMESARTAN MEDOXOMIL-HCTZ 40-25 MG PO TABS: 1.0000 | ORAL_TABLET | Freq: Every day | ORAL | 1 refills | Status: DC

## 2019-06-02 NOTE — Progress Notes (Signed)
Established Patient Office Visit  Subjective:  Patient ID: Tammy Mccullough, female    DOB: 08/17/1941  Age: 78 y.o. MRN: [id number]  CC:  Chief Complaint  Patient presents with  . Knee Pain    Pt stated both knees are doing better bot sometimes when sitting for a while gets sore.    HPI Tammy Mccullough presents for   Arthritis She is working with physical therapy and her knees are "much much better" She is using the voltaren gel but not as often She is doing to be doing water aerobics  She reports that if she is sitting for a while it gets sore. She reports that this has improved  She reports that the weather has not impacted her knees  Hypertension: Patient here for follow-up of elevated blood pressure. She is exercising and is adherent to low salt diet.  Blood pressure is well controlled at home. Cardiac symptoms none. Patient denies chest pain, chest pressure/discomfort, dyspnea, fatigue and lower extremity edema.  Cardiovascular risk factors: hypertension and obesity (BMI >= 30 kg/m2). Use of agents associated with hypertension: none. History of target organ damage: none. BP Readings from Last 3 Encounters:  06/02/19 (!) 148/72  05/01/19 130/68  12/22/18 (!) 142/72     Past Medical History:  Diagnosis Date  . Arthritis    B knees.  . Cataract   . Glaucoma   . Glucose intolerance (impaired glucose tolerance)   . Hypertension   . Renal insufficiency     Past Surgical History:  Procedure Laterality Date  . CATARACT EXTRACTION Right 07-2014  . CATARACT EXTRACTION Left 01/16/2016  . COLONOSCOPY     10 +yrs ago in [place], normal per pt  . VAGINAL DELIVERY     x2    Family History  Problem Relation Age of Onset  . Diabetes Mother   . Heart disease Mother        unsure  . COPD Father   . Diabetes Sister   . Hypertension Sister   . Kidney disease Sister   . Diabetes Brother   . Diabetes Brother   . Colon cancer Neg Hx   . Rectal cancer Neg Hx    . Stomach cancer Neg Hx     Social History   Socioeconomic History  . Marital status: Widowed    Spouse name: Not on file  . Number of children: 2  . Years of education: Not on file  . Highest education level: Not on file  Occupational History  . Occupation: retired  Scientific laboratory technician  . Financial resource strain: Not on file  . Food insecurity    Worry: Not on file    Inability: Not on file  . Transportation needs    Medical: Not on file    Non-medical: Not on file  Tobacco Use  . Smoking status: Never Smoker  . Smokeless tobacco: Never Used  Substance and Sexual Activity  . Alcohol use: Yes    Alcohol/week: 6.0 standard drinks    Types: 6 Standard drinks or equivalent per week    Comment: 2-3  . Drug use: No  . Sexual activity: Not Currently  Lifestyle  . Physical activity    Days per week: Not on file    Minutes per session: Not on file  . Stress: Not on file  Relationships  . Social Herbalist on phone: Not on file    Gets together: Not on file    Attends  religious service: Not on file    Active member of club or organization: Not on file    Attends meetings of clubs or organizations: Not on file    Relationship status: Not on file  . Intimate partner violence    Fear of current or ex partner: Not on file    Emotionally abused: Not on file    Physically abused: Not on file    Forced sexual activity: Not on file  Other Topics Concern  . Not on file  Social History Narrative   Marital status: widowed since 2010 after 46 years of marriage.  Not dating but would like to be.      Children:  2 Children; no grandchildren; 2 adopted grandchildren.  One son is disabled; patient supports; son is obese.      Living: alone with 1 cat.      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      Tobacco: never      Alcohol: weekends liquor; some during the week.  3-5 cups per week.      Drugs: none       Exercise: water aerobics three days per week.  [employer].      Seatbelt: 100%;      Guns: none      ADLs:: independent with ADLs.   Drives. No assistant devices.      Advanced Directives: yes; healthcare POA:  Older brother [name]);  FULL CODE but no prolonged measures.      Outpatient Medications Prior to Visit  Medication Sig Dispense Refill  . acetaminophen (TYLENOL) 500 MG tablet Take 500 mg by mouth every 8 (eight) hours as needed.    Marland Kitchen aspirin 81 MG tablet Take 81 mg by mouth daily. Reported on 02/15/2016    . AZOPT 1 % ophthalmic suspension     . 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 ZIOPTAN 0.0015 % SOLN     . olmesartan-hydrochlorothiazide (BENICAR HCT) 40-25 MG tablet Take 1 tablet by mouth daily. 90 tablet 1   No facility-administered medications prior to visit.     No Known Allergies  ROS Review of Systems Review of Systems  Constitutional: Negative for activity change, appetite change, chills and fever.  HENT: Negative for congestion, nosebleeds, trouble swallowing and voice change.   Respiratory: Negative for cough, shortness of breath and wheezing.   Gastrointestinal: Negative for diarrhea, nausea and vomiting.  Genitourinary: Negative for difficulty urinating, dysuria, flank pain and hematuria.  Musculoskeletal: see hpi Neurological: Negative for dizziness, speech difficulty, light-headedness and numbness.  See HPI. All other review of systems negative.     Objective:    Physical Exam  BP (!) 148/72 (BP Location: Right Arm, Patient Position: Sitting, Cuff Size: Normal)   Pulse 86   Temp 98.6 F (37 C)   Ht 5\' 5"  (1.651 m)   Wt 240 lb (108.9 kg)   SpO2 97%   BMI 39.94 kg/m  Wt Readings from Last 3 Encounters:  06/02/19 240 lb (108.9 kg)  05/01/19 239 lb 3.2 oz (108.5 kg)  12/22/18 235 lb 6.4 oz (106.8 kg)   Physical Exam  Constitutional: Oriented to person, place, and time.  Appears well-developed and well-nourished.  HENT:  Head: Normocephalic and atraumatic.  Eyes: Conjunctivae and EOM are normal.  Cardiovascular: Normal rate, regular rhythm, normal heart sounds and intact distal  pulses.  No murmur heard. Pulmonary/Chest: Effort normal and breath sounds normal. No stridor. No respiratory distress. Has no wheezes.  Neurological: Is alert and oriented to person, place, and time.  Skin: Skin is warm. Capillary refill takes less than 2 seconds.  Psychiatric: Has a normal mood and affect. Behavior is normal. Judgment and thought content normal.   Knee exam Left knee with crepitus on the lateral joint line otherwise good range of motion no edema No posterior knee pain No patellar tendon tenderness   There are no preventive care reminders to display for this patient.  There are no preventive care reminders to display for this patient.  No results found for: TSH Lab Results  Component Value Date   WBC 4.7 12/22/2018   HGB 12.2 12/22/2018   HCT 37.6 12/22/2018   MCV 94 12/22/2018   PLT 189 12/22/2018   Lab Results  Component Value Date   NA 140 12/22/2018   K 4.2 12/22/2018   CO2 20 12/22/2018   GLUCOSE 104 (H) 12/22/2018   BUN 27 12/22/2018   CREATININE 1.49 (H) 12/22/2018   BILITOT 0.9 12/22/2018   ALKPHOS 63 12/22/2018   AST 23 12/22/2018   ALT 13 12/22/2018   PROT 7.1 12/22/2018   ALBUMIN 4.4 12/22/2018   CALCIUM 9.8 12/22/2018   Lab Results  Component Value Date   CHOL 220 (H) 12/22/2018   Lab Results  Component Value Date   HDL 93 12/22/2018   Lab Results  Component Value Date   LDLCALC 110 (H) 12/22/2018   Lab Results  Component Value Date   TRIG 87 12/22/2018   Lab Results  Component Value Date   CHOLHDL 2.4 12/22/2018   Lab Results  Component Value Date   HGBA1C 5.4 10/25/2017      Assessment & Plan:   Problem List Items Addressed This Visit      Cardiovascular and Mediastinum   HTN (hypertension) - Primary  Patient's blood pressure is at goal of 139/89 or less. Condition is stable. Continue current medications and treatment plan. I recommend that you exercise for 30-45 minutes 5 days a week. I also recommend a balanced diet with fruits and vegetables every day, lean meats, and little fried foods. The DASH diet (you can find this online) is a good example of this.    Relevant Medications   olmesartan-hydrochlorothiazide (BENICAR HCT) 40-25 MG tablet    Other Visit Diagnoses    Need for immunization against influenza       Relevant Orders   Flu Vaccine QUAD High Dose(Fluad) (Completed)   Arthralgia of both knees    - improved with physical therapy      Meds ordered this encounter  Medications  . olmesartan-hydrochlorothiazide (BENICAR HCT) 40-25 MG tablet    Sig: Take 1 tablet by mouth daily.    Dispense:  90 tablet    Refill:  1    Please store refill on file.    Follow-up: No follow-ups on file.    Forrest Moron, MD

## 2019-06-02 NOTE — Patient Instructions (Signed)
° ° ° °  If you have lab work done today you will be contacted with your lab results within the next 2 weeks.  If you have not heard from us then please contact us. The fastest way to get your results is to register for My Chart. ° ° °IF you received an x-ray today, you will receive an invoice from [facility]. Please contact [facility] at [phone] with questions or concerns regarding your invoice.  ° °IF you received labwork today, you will receive an invoice from [facility]. Please contact [facility] at [phone] with questions or concerns regarding your invoice.  ° °Our billing staff will not be able to assist you with questions regarding bills from these companies. ° °You will be contacted with the lab results as soon as they are available. The fastest way to get your results is to activate your My Chart account. Instructions are located on the last page of this paperwork. If you have not heard from us regarding the results in 2 weeks, please contact this office. °  ° ° ° °

## 2019-08-29 ENCOUNTER — Other Ambulatory Visit: Payer: Medicare Other

## 2019-08-31 ENCOUNTER — Telehealth: Payer: Self-pay | Admitting: Family Medicine

## 2019-08-31 NOTE — Telephone Encounter (Signed)
Copied from [place] [phone]. Topic: Referral - Request for Referral >> Aug 31, 2019 12:58 PM Richardo Priest, [place] wrote: Reason for CRM: Pt called in stating she is needing a new referral sent to [facility], as they cannot take the old one. Please advise.

## 2019-09-19 ENCOUNTER — Other Ambulatory Visit: Payer: Self-pay

## 2019-09-19 ENCOUNTER — Ambulatory Visit (INDEPENDENT_AMBULATORY_CARE_PROVIDER_SITE_OTHER): Payer: Medicare Other | Admitting: Family Medicine

## 2019-09-19 ENCOUNTER — Encounter: Payer: Self-pay | Admitting: Family Medicine

## 2019-09-19 VITALS — BP 130/70 | HR 88 | Temp 98.0°F | Resp 18 | Ht 65.0 in | Wt 238.6 lb

## 2019-09-19 DIAGNOSIS — M25562 Pain in left knee: Secondary | ICD-10-CM | POA: Diagnosis not present

## 2019-09-19 DIAGNOSIS — M25561 Pain in right knee: Secondary | ICD-10-CM | POA: Diagnosis not present

## 2019-09-19 DIAGNOSIS — G8929 Other chronic pain: Secondary | ICD-10-CM | POA: Diagnosis not present

## 2019-09-19 NOTE — Patient Instructions (Addendum)
  Follow up in May for annual wellness visit    If you have lab work done today you will be contacted with your lab results within the next 2 weeks.  If you have not heard from [place] then please contact us. The fastest way to get your results is to register for My Chart.   IF you received an x-ray today, you will receive an invoice from [facility]. Please contact [facility] at [phone] with questions or concerns regarding your invoice.   IF you received labwork today, you will receive an invoice from [facility]. Please contact [facility] at [phone] with questions or concerns regarding your invoice.   Our billing staff will not be able to assist you with questions regarding bills from these companies.  You will be contacted with the lab results as soon as they are available. The fastest way to get your results is to activate your My Chart account. Instructions are located on the last page of this paperwork. If you have not heard from [place] regarding the results in 2 weeks, please contact this office.

## 2019-09-19 NOTE — Progress Notes (Signed)
Established Patient Office Visit  Subjective:  Patient ID: Tammy Mccullough, female    DOB: 07/26/1941  Age: 79 y.o. MRN: [id number]  CC:  Chief Complaint  Patient presents with  . Knee Pain    review knee pain concerns, insurance only paid for 14 sessions and needs another referral or recommend something else.    HPI Tammy Mccullough presents for   She reports that she completed 14 sessions of PT last year She states that PT helped her knee pain  She would like to resume She states that when she does not exercise the pain comes back  Stiffness, pain, decreased range of motion with both knees She gets locking and buckling as well      Past Medical History:  Diagnosis Date  . Arthritis    B knees.  . Cataract   . Glaucoma   . Glucose intolerance (impaired glucose tolerance)   . Hypertension   . Renal insufficiency     Past Surgical History:  Procedure Laterality Date  . CATARACT EXTRACTION Right 07-2014  . CATARACT EXTRACTION Left 01/16/2016  . COLONOSCOPY     10 +yrs ago in [place], normal per pt  . VAGINAL DELIVERY     x2    Family History  Problem Relation Age of Onset  . Diabetes Mother   . Heart disease Mother        unsure  . COPD Father   . Diabetes Sister   . Hypertension Sister   . Kidney disease Sister   . Diabetes Brother   . Diabetes Brother   . Colon cancer Neg Hx   . Rectal cancer Neg Hx   . Stomach cancer Neg Hx     Social History   Socioeconomic History  . Marital status: Widowed    Spouse name: Not on file  . Number of children: 2  . Years of education: Not on file  . Highest education level: Not on file  Occupational History  . Occupation: retired  Tobacco Use  . Smoking status: Never Smoker  . Smokeless tobacco: Never Used  Substance and Sexual Activity  . Alcohol use: Yes    Alcohol/week: 6.0 standard drinks    Types: 6 Standard drinks or equivalent per week    Comment: 2-3  . Drug use: No  . Sexual  activity: Not Currently  Other Topics Concern  . Not on file  Social History Narrative   Marital status: widowed since 2010 after 51 years of marriage.  Not dating but would like to be.      Children:  2 Children; no grandchildren; 2 adopted grandchildren.  One son is disabled; patient supports; son is obese.      Living: alone with 1 cat.      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      Tobacco: never      Alcohol: weekends liquor; some during the week.  3-5 cups per week.      Drugs: none      Exercise: water aerobics three days per week.  [employer].      Seatbelt: 100%;      Guns: none      ADLs:: independent with ADLs.   Drives. No assistant devices.      Advanced Directives: yes; healthcare POA:  Older brother [name]);  FULL CODE but no prolonged measures.     Social Determinants of Health  Financial Resource Strain:   . Difficulty of Paying Living Expenses: Not on file  Food Insecurity:   . Worried About Charity fundraiser in the Last Year: Not on file  . Ran Out of Food in the Last Year: Not on file  Transportation Needs:   . Lack of Transportation (Medical): Not on file  . Lack of Transportation (Non-Medical): Not on file  Physical Activity:   . Days of Exercise per Week: Not on file  . Minutes of Exercise per Session: Not on file  Stress:   . Feeling of Stress : Not on file  Social Connections:   . Frequency of Communication with Friends and Family: Not on file  . Frequency of Social Gatherings with Friends and Family: Not on file  . Attends Religious Services: Not on file  . Active Member of Clubs or Organizations: Not on file  . Attends Archivist Meetings: Not on file  . Marital Status: Not on file  Intimate Partner Violence:   . Fear of Current or Ex-Partner: Not on file  . Emotionally Abused: Not on file  . Physically Abused: Not on file  . Sexually Abused: Not on file     Outpatient Medications Prior to Visit  Medication Sig Dispense Refill  . acetaminophen (TYLENOL) 500 MG tablet Take 500 mg by mouth every 8 (eight) hours as needed.    Marland Kitchen aspirin 81 MG tablet Take 81 mg by mouth daily. Reported on 02/15/2016    . AZOPT 1 % ophthalmic suspension     . 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 olmesartan-hydrochlorothiazide (BENICAR HCT) 40-25 MG tablet Take 1 tablet by mouth daily. 90 tablet 1  . ZIOPTAN 0.0015 % SOLN      No facility-administered medications prior to visit.    No Known Allergies  ROS Review of Systems Review of Systems  Constitutional: Negative for activity change, appetite change, chills and fever.  HENT: Negative for congestion, nosebleeds, trouble swallowing and voice change.   Respiratory: Negative for cough, shortness of breath and wheezing.   Neurological: Negative for dizziness, speech difficulty, light-headedness and numbness.  See HPI. All other review of systems negative.     Objective:    Physical Exam  BP 130/70 (BP Location: Left Arm, Patient Position: Sitting, Cuff Size: Large)   Pulse 88   Temp 98 F (36.7 C) (Oral)   Resp 18   Ht 5\' 5"  (1.651 m)   Wt 238 lb 9.6 oz (108.2 kg)   SpO2 99%   BMI 39.71 kg/m  Wt Readings from Last 3 Encounters:  09/19/19 238 lb 9.6 oz (108.2 kg)  06/02/19 240 lb (108.9 kg)  05/01/19 239 lb 3.2 oz (108.5 kg)   Physical Exam  Constitutional: Oriented to person, place, and time. Appears well-developed and well-nourished.  HENT:  Head: Normocephalic and atraumatic.  Eyes: Conjunctivae and EOM are normal.  Cardiovascular: Normal rate, regular rhythm, normal heart sounds and intact distal pulses.  No murmur heard. Pulmonary/Chest: Effort normal and breath sounds normal. No stridor. No respiratory distress. Has no wheezes.  Neurological: Is alert and oriented to person, place, and time.  Skin: Skin is warm. Capillary  refill takes less than 2 seconds.  Psychiatric: Has a normal mood and affect. Behavior is normal. Judgment and thought content normal.   Knee pain Bilateral joint knee pain No effusion +crepitus Stable joint    Health Maintenance  Due  Topic Date Due  . PNA vac Low Risk Adult (2 of 2 - PPSV23) 07/26/2015    There are no preventive care reminders to display for this patient.  No results found for: TSH Lab Results  Component Value Date   WBC 4.7 12/22/2018   HGB 12.2 12/22/2018   HCT 37.6 12/22/2018   MCV 94 12/22/2018   PLT 189 12/22/2018   Lab Results  Component Value Date   NA 140 12/22/2018   K 4.2 12/22/2018   CO2 20 12/22/2018   GLUCOSE 104 (H) 12/22/2018   BUN 27 12/22/2018   CREATININE 1.49 (H) 12/22/2018   BILITOT 0.9 12/22/2018   ALKPHOS 63 12/22/2018   AST 23 12/22/2018   ALT 13 12/22/2018   PROT 7.1 12/22/2018   ALBUMIN 4.4 12/22/2018   CALCIUM 9.8 12/22/2018   Lab Results  Component Value Date   CHOL 220 (H) 12/22/2018   Lab Results  Component Value Date   HDL 93 12/22/2018   Lab Results  Component Value Date   LDLCALC 110 (H) 12/22/2018   Lab Results  Component Value Date   TRIG 87 12/22/2018   Lab Results  Component Value Date   CHOLHDL 2.4 12/22/2018   Lab Results  Component Value Date   HGBA1C 5.4 10/25/2017      Assessment & Plan:   Problem List Items Addressed This Visit    None    Visit Diagnoses    Bilateral chronic knee pain    -  Primary   Relevant Orders   Ambulatory referral to Physical Therapy     -  Advised pt to resume PT to help strengthen the joint and alleviate pain.   No orders of the defined types were placed in this encounter.   Follow-up: Return in about 3 months (around 12/17/2019) for annual wellness .    Forrest Moron, MD

## 2019-10-03 ENCOUNTER — Other Ambulatory Visit: Payer: Self-pay | Admitting: Family Medicine

## 2019-10-03 DIAGNOSIS — Z1231 Encounter for screening mammogram for malignant neoplasm of breast: Secondary | ICD-10-CM

## 2019-10-07 ENCOUNTER — Encounter: Payer: Self-pay | Admitting: Family Medicine

## 2019-10-10 ENCOUNTER — Encounter: Payer: Self-pay | Admitting: Family Medicine

## 2019-10-11 NOTE — Telephone Encounter (Signed)
LVM for patient

## 2019-10-11 NOTE — Telephone Encounter (Signed)
Plan of Care for [facility] has been signed and faxed back to [phone]

## 2019-10-26 ENCOUNTER — Ambulatory Visit: Payer: Self-pay | Admitting: Family Medicine

## 2019-10-26 NOTE — Telephone Encounter (Signed)
Please Advise

## 2019-10-26 NOTE — Telephone Encounter (Signed)
Pt reports BP trending up, noted yesterday. States yesterday 171/84, this am 159/81- 171/84. No missed doses of olmesartan hydrochlorothiazide. Denies headache, no visual changes, reports mild lightheadedness at times. States BP usually 130'70's. Practice closed for lunch. Assured pt NT would route to practice for PCP review. Care advise given; pt verbalizes understanding.  CB# [id number]  Reason for Disposition . Systolic BP  >= 0000000 OR Diastolic >= 123XX123  Answer Assessment - Initial Assessment Questions 1. BLOOD PRESSURE: "What is the blood pressure?" "Did you take at least two measurements 5 minutes apart?"    171/84 2. ONSET: "When did you take your blood pressure?"      3. HOW: "How did you obtain the blood pressure?" (e.g., visiting nurse, automatic home BP monitor)     Home, arm cuff 4. HISTORY: "Do you have a history of high blood pressure?"     no 5. MEDICATIONS: "Are you taking any medications for blood pressure?" "Have you missed any doses recently?"     no 6. OTHER SYMPTOMS: "Do you have any symptoms?" (e.g., headache, chest pain, blurred vision, difficulty breathing, weakness)     "lightheaded at times"  Protocols used: HIGH BLOOD PRESSURE-A-AH

## 2019-10-27 ENCOUNTER — Telehealth: Payer: Self-pay

## 2019-10-27 ENCOUNTER — Other Ambulatory Visit: Payer: Self-pay | Admitting: Family Medicine

## 2019-10-27 MED ORDER — AMLODIPINE BESYLATE 5 MG PO TABS: 5.0000 mg | ORAL_TABLET | Freq: Every day | ORAL | 0 refills | Status: DC

## 2019-10-27 NOTE — Telephone Encounter (Signed)
Pt calling again today regarding elevated bp's.  Per pt bp at 10:26 am 165/80, 11:20 am 139/64 and 2:47pm 157/83 and she is very concerned as her bp's run 130/70.  Advised pt I will contact Dr. Nolon Rod of her concerns and bp numbers and contact her back.  Per stallings she will add amlodipine 5 mg and wants pt to come in to office for nurse visit in 2 weeks for bp check and compare pt bp machine with our bp machine .  Pt appt scheduled 11/15/2019 at 8:00 am and pt agreeable.

## 2019-11-08 ENCOUNTER — Other Ambulatory Visit: Payer: Self-pay

## 2019-11-08 ENCOUNTER — Ambulatory Visit
Admission: RE | Admit: 2019-11-08 | Discharge: 2019-11-08 | Disposition: A | Discharge status: Home / Self Care | Payer: Medicare Other | Source: Ambulatory Visit | Attending: Family Medicine | Admitting: Family Medicine

## 2019-11-08 DIAGNOSIS — Z1231 Encounter for screening mammogram for malignant neoplasm of breast: Secondary | ICD-10-CM

## 2019-11-15 ENCOUNTER — Ambulatory Visit (INDEPENDENT_AMBULATORY_CARE_PROVIDER_SITE_OTHER): Payer: Medicare Other | Admitting: Family Medicine

## 2019-11-15 ENCOUNTER — Other Ambulatory Visit: Payer: Self-pay

## 2019-11-15 VITALS — BP 152/78 | HR 101

## 2019-11-15 DIAGNOSIS — Z0130 Encounter for examination of blood pressure without abnormal findings: Secondary | ICD-10-CM

## 2019-11-15 NOTE — Progress Notes (Signed)
Pt has come in for the BP check.   Readings are 157/80 & manual at 152/78

## 2019-12-01 ENCOUNTER — Encounter: Payer: Self-pay | Admitting: Family Medicine

## 2019-12-01 ENCOUNTER — Ambulatory Visit (INDEPENDENT_AMBULATORY_CARE_PROVIDER_SITE_OTHER): Payer: Medicare Other | Admitting: Family Medicine

## 2019-12-01 ENCOUNTER — Other Ambulatory Visit: Payer: Self-pay

## 2019-12-01 VITALS — BP 132/60 | HR 91 | Temp 97.6°F | Ht 65.0 in | Wt 234.4 lb

## 2019-12-01 DIAGNOSIS — I10 Essential (primary) hypertension: Secondary | ICD-10-CM

## 2019-12-01 MED ORDER — OLMESARTAN MEDOXOMIL-HCTZ 40-25 MG PO TABS: 1.0000 | ORAL_TABLET | Freq: Every day | ORAL | 1 refills | Status: DC

## 2019-12-01 MED ORDER — AMLODIPINE BESYLATE 5 MG PO TABS: 5.0000 mg | ORAL_TABLET | Freq: Every day | ORAL | 1 refills | Status: DC

## 2019-12-01 NOTE — Progress Notes (Signed)
Established Patient Office Visit  Subjective:  Patient ID: Tammy Mccullough, female    DOB: [birth year]  Age: 79 y.o. MRN: [id number]  CC:  Chief Complaint  Patient presents with  . Hypertension    6 m f/u     HPI Tammy Mccullough presents for   Hypertension: Patient here for follow-up of elevated blood pressure. She is exercising and is adherent to low salt diet.  Blood pressure is well controlled at home. Cardiac symptoms none. Patient denies chest pain, chest pressure/discomfort, dyspnea, fatigue, irregular heart beat, near-syncope and orthopnea.  Cardiovascular risk factors: advanced age (older than 71 for men, 59 for women) and hypertension. Use of agents associated with hypertension: none. History of target organ damage: none. BP Readings from Last 3 Encounters:  12/01/19 132/60  11/15/19 (!) 152/78  09/19/19 130/70      Past Medical History:  Diagnosis Date  . Arthritis    B knees.  . Cataract   . Glaucoma   . Glucose intolerance (impaired glucose tolerance)   . Hypertension   . Renal insufficiency     Past Surgical History:  Procedure Laterality Date  . CATARACT EXTRACTION Right 07-2014  . CATARACT EXTRACTION Left 01/16/2016  . COLONOSCOPY     10 +yrs ago in [place], normal per pt  . VAGINAL DELIVERY     x2    Family History  Problem Relation Age of Onset  . Diabetes Mother   . Heart disease Mother        unsure  . COPD Father   . Diabetes Sister   . Hypertension Sister   . Kidney disease Sister   . Diabetes Brother   . Diabetes Brother   . Colon cancer Neg Hx   . Rectal cancer Neg Hx   . Stomach cancer Neg Hx     Social History   Socioeconomic History  . Marital status: Widowed    Spouse name: Not on file  . Number of children: 2  . Years of education: Not on file  . Highest education level: Not on file  Occupational History  . Occupation: retired  Tobacco Use  . Smoking status: Never Smoker  . Smokeless tobacco: Never Used    Substance and Sexual Activity  . Alcohol use: Yes    Alcohol/week: 6.0 standard drinks    Types: 6 Standard drinks or equivalent per week    Comment: 2-3  . Drug use: No  . Sexual activity: Not Currently  Other Topics Concern  . Not on file  Social History Narrative   Marital status: widowed since 2010 after 58 years of marriage.  Not dating but would like to be.      Children:  2 Children; no grandchildren; 2 adopted grandchildren.  One son is disabled; patient supports; son is obese.      Living: alone with 1 cat.      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      Tobacco: never      Alcohol: weekends liquor; some during the week.  3-5 cups per week.      Drugs: none      Exercise: water aerobics three days per week.  [employer].      Seatbelt: 100%;      Guns: none      ADLs:: independent with ADLs.   Drives. No assistant devices.      Advanced Directives: yes; healthcare POA:  Older brother Tammy Mccullough);  FULL CODE but no prolonged measures.     Social Determinants of Health   Financial Resource Strain:   . Difficulty of Paying Living Expenses:   Food Insecurity:   . Worried About Charity fundraiser in the Last Year:   . Arboriculturist in the Last Year:   Transportation Needs:   . Film/video editor (Medical):   Marland Kitchen Lack of Transportation (Non-Medical):   Physical Activity:   . Days of Exercise per Week:   . Minutes of Exercise per Session:   Stress:   . Feeling of Stress :   Social Connections:   . Frequency of Communication with Friends and Family:   . Frequency of Social Gatherings with Friends and Family:   . Attends Religious Services:   . Active Member of Clubs or Organizations:   . Attends Archivist Meetings:   Marland Kitchen Marital Status:   Intimate Partner Violence:   . Fear of Current or Ex-Partner:   . Emotionally Abused:   Marland Kitchen Physically Abused:   . Sexually Abused:      Outpatient Medications Prior to Visit  Medication Sig Dispense Refill  . acetaminophen (TYLENOL) 500 MG tablet Take 500 mg by mouth every 8 (eight) hours as needed.    Marland Kitchen amLODipine (NORVASC) 5 MG tablet Take 1 tablet (5 mg total) by mouth daily. 90 tablet 0  . aspirin 81 MG tablet Take 81 mg by mouth daily. Reported on 02/15/2016    . AZOPT 1 % ophthalmic suspension     . 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 olmesartan-hydrochlorothiazide (BENICAR HCT) 40-25 MG tablet Take 1 tablet by mouth daily. 90 tablet 1  . ZIOPTAN 0.0015 % SOLN      No facility-administered medications prior to visit.    No Known Allergies  ROS Review of Systems Review of Systems  Constitutional: Negative for activity change, appetite change, chills and fever.  HENT: Negative for congestion, nosebleeds, trouble swallowing and voice change.   Respiratory: Negative for cough, shortness of breath and wheezing.   Gastrointestinal: Negative for diarrhea, nausea and vomiting.  Genitourinary: Negative for difficulty urinating, dysuria, flank pain and hematuria.  Musculoskeletal: Negative for back pain, joint swelling and neck pain.  Neurological: Negative for dizziness, speech difficulty, light-headedness and numbness.  See HPI. All other review of systems negative.     Objective:    Physical Exam  BP 132/60   Pulse 91   Temp 97.6 F (36.4 C) (Temporal)   Ht 5' 5"  (1.651 m)   Wt 234 lb 6.4 oz (106.3 kg)   SpO2 98%   BMI 39.01 kg/m  Wt Readings from Last 3 Encounters:  12/01/19 234 lb 6.4 oz (106.3 kg)  09/19/19 238 lb 9.6 oz (108.2 kg)  06/02/19 240 lb (108.9 kg)   Physical Exam  Constitutional: Oriented to person, place, and time. Appears well-developed and well-nourished.  HENT:  Head: Normocephalic and atraumatic.  Eyes: Conjunctivae and EOM are normal.  Cardiovascular: Normal rate, regular rhythm, normal heart sounds and intact  distal pulses.  No murmur heard. Pulmonary/Chest: Effort normal and breath sounds normal. No stridor. No respiratory distress. Has no wheezes.  Neurological: Is alert and oriented to person, place, and time.  Skin: Skin is warm. Capillary refill takes less than 2 seconds.  Psychiatric: Has a normal mood and affect. Behavior is normal. Judgment and thought  content normal.    Health Maintenance Due  Topic Date Due  . PNA vac Low Risk Adult (2 of 2 - PPSV23) 07/26/2015  . COLONOSCOPY  10/16/2019    There are no preventive care reminders to display for this patient.  No results found for: TSH Lab Results  Component Value Date   WBC 4.7 12/22/2018   HGB 12.2 12/22/2018   HCT 37.6 12/22/2018   MCV 94 12/22/2018   PLT 189 12/22/2018   Lab Results  Component Value Date   NA 140 12/22/2018   K 4.2 12/22/2018   CO2 20 12/22/2018   GLUCOSE 104 (H) 12/22/2018   BUN 27 12/22/2018   CREATININE 1.49 (H) 12/22/2018   BILITOT 0.9 12/22/2018   ALKPHOS 63 12/22/2018   AST 23 12/22/2018   ALT 13 12/22/2018   PROT 7.1 12/22/2018   ALBUMIN 4.4 12/22/2018   CALCIUM 9.8 12/22/2018   Lab Results  Component Value Date   CHOL 220 (H) 12/22/2018   Lab Results  Component Value Date   HDL 93 12/22/2018   Lab Results  Component Value Date   LDLCALC 110 (H) 12/22/2018   Lab Results  Component Value Date   TRIG 87 12/22/2018   Lab Results  Component Value Date   CHOLHDL 2.4 12/22/2018   Lab Results  Component Value Date   HGBA1C 5.4 10/25/2017      Assessment & Plan:   Problem List Items Addressed This Visit      Cardiovascular and Mediastinum   HTN (hypertension) - Primary   Relevant Orders   CMP14+EGFR   Lipid panel     Patient's blood pressure is at goal of 139/89 or less. Condition is stable. Continue current medications and treatment plan. I recommend that you exercise for 30-45 minutes 5 days a week. I also recommend a balanced diet with fruits and vegetables  every day, lean meats, and little fried foods. The DASH diet (you can find this online) is a good example of this.  No orders of the defined types were placed in this encounter.   Follow-up: No follow-ups on file.    Forrest Moron, MD

## 2019-12-01 NOTE — Patient Instructions (Addendum)
If you have lab work done today you will be contacted with your lab results within the next 2 weeks.  If you have not heard from [place] then please contact us. The fastest way to get your results is to register for My Chart.   IF you received an x-ray today, you will receive an invoice from [facility]. Please contact [facility] at [phone] with questions or concerns regarding your invoice.   IF you received labwork today, you will receive an invoice from [facility]. Please contact [facility] at [phone] with questions or concerns regarding your invoice.   Our billing staff will not be able to assist you with questions regarding bills from these companies.  You will be contacted with the lab results as soon as they are available. The fastest way to get your results is to activate your My Chart account. Instructions are located on the last page of this paperwork. If you have not heard from [place] regarding the results in 2 weeks, please contact this office.      Hypertension, Adult Hypertension is another name for high blood pressure. High blood pressure forces your heart to work harder to pump blood. This can cause problems over time. There are two numbers in a blood pressure reading. There is a top number (systolic) over a bottom number (diastolic). It is best to have a blood pressure that is below 120/80. Healthy choices can help lower your blood pressure, or you may need medicine to help lower it. What are the causes? The cause of this condition is not known. Some conditions may be related to high blood pressure. What increases the risk?  Smoking.  Having type 2 diabetes mellitus, high cholesterol, or both.  Not getting enough exercise or physical activity.  Being overweight.  Having too much fat, sugar, calories, or salt (sodium) in your diet.  Drinking too much alcohol.  Having long-term (chronic) kidney disease.  Having a family history of high blood  pressure.  Age. Risk increases with age.  Race. You may be at higher risk if you are African American.  Gender. Men are at higher risk than women before age 56. After age 61, women are at higher risk than men.  Having obstructive sleep apnea.  Stress. What are the signs or symptoms?  High blood pressure may not cause symptoms. Very high blood pressure (hypertensive crisis) may cause: ? Headache. ? Feelings of worry or nervousness (anxiety). ? Shortness of breath. ? Nosebleed. ? A feeling of being sick to your stomach (nausea). ? Throwing up (vomiting). ? Changes in how you see. ? Very bad chest pain. ? Seizures. How is this treated?  This condition is treated by making healthy lifestyle changes, such as: ? Eating healthy foods. ? Exercising more. ? Drinking less alcohol.  Your health care provider may prescribe medicine if lifestyle changes are not enough to get your blood pressure under control, and if: ? Your top number is above 130. ? Your bottom number is above 80.  Your personal target blood pressure may vary. Follow these instructions at home: Eating and drinking   If told, follow the DASH eating plan. To follow this plan: ? Fill one half of your plate at each meal with fruits and vegetables. ? Fill one fourth of your plate at each meal with whole grains. Whole grains include whole-wheat pasta, brown rice, and whole-grain bread. ? Eat or drink low-fat dairy products, such as skim milk or low-fat yogurt. ? Fill  one fourth of your plate at each meal with low-fat (lean) proteins. Low-fat proteins include fish, chicken without skin, eggs, beans, and tofu. ? Avoid fatty meat, cured and processed meat, or chicken with skin. ? Avoid pre-made or processed food.  Eat less than 1,500 mg of salt each day.  Do not drink alcohol if: ? Your doctor tells you not to drink. ? You are pregnant, may be pregnant, or are planning to become pregnant.  If you drink  alcohol: ? Limit how much you use to:  0-1 drink a day for women.  0-2 drinks a day for men. ? Be aware of how much alcohol is in your drink. In the [place] one drink equals one 12 oz bottle of beer (355 mL), one 5 oz glass of wine (148 mL), or one 1 oz glass of hard liquor (44 mL). Lifestyle   Work with your doctor to stay at a healthy weight or to lose weight. Ask your doctor what the best weight is for you.  Get at least 30 minutes of exercise most days of the week. This may include walking, swimming, or biking.  Get at least 30 minutes of exercise that strengthens your muscles (resistance exercise) at least 3 days a week. This may include lifting weights or doing Pilates.  Do not use any products that contain nicotine or tobacco, such as cigarettes, e-cigarettes, and chewing tobacco. If you need help quitting, ask your doctor.  Check your blood pressure at home as told by your doctor.  Keep all follow-up visits as told by your doctor. This is important. Medicines  Take over-the-counter and prescription medicines only as told by your doctor. Follow directions carefully.  Do not skip doses of blood pressure medicine. The medicine does not work as well if you skip doses. Skipping doses also puts you at risk for problems.  Ask your doctor about side effects or reactions to medicines that you should watch for. Contact a doctor if you:  Think you are having a reaction to the medicine you are taking.  Have headaches that keep coming back (recurring).  Feel dizzy.  Have swelling in your ankles.  Have trouble with your vision. Get help right away if you:  Get a very bad headache.  Start to feel mixed up (confused).  Feel weak or numb.  Feel faint.  Have very bad pain in your: ? Chest. ? Belly (abdomen).  Throw up more than once.  Have trouble breathing. Summary  Hypertension is another name for high blood pressure.  High blood pressure forces your heart to work  harder to pump blood.  For most people, a normal blood pressure is less than 120/80.  Making healthy choices can help lower blood pressure. If your blood pressure does not get lower with healthy choices, you may need to take medicine. This information is not intended to replace advice given to you by your health care provider. Make sure you discuss any questions you have with your health care provider. Document Revised: 04/13/2018 Document Reviewed: 04/13/2018 Elsevier Patient Education  2020 [employer].

## 2019-12-02 LAB — CMP14+EGFR
ALT: 11 IU/L (ref 0–32)
AST: 20 IU/L (ref 0–40)
Albumin/Globulin Ratio: 1.4 (ref 1.2–2.2)
Albumin: 4.1 g/dL (ref 3.7–4.7)
Alkaline Phosphatase: 64 IU/L (ref 39–117)
BUN/Creatinine Ratio: 19 (ref 12–28)
BUN: 27 mg/dL (ref 8–27)
Bilirubin Total: 0.5 mg/dL (ref 0.0–1.2)
CO2: 23 mmol/L (ref 20–29)
Calcium: 9.6 mg/dL (ref 8.7–10.3)
Chloride: 104 mmol/L (ref 96–106)
Creatinine, Ser: 1.45 mg/dL — ABNORMAL HIGH (ref 0.57–1.00)
GFR calc Af Amer: 39 mL/min/{1.73_m2} — ABNORMAL LOW (ref >59)
GFR calc non Af Amer: 34 mL/min/{1.73_m2} — ABNORMAL LOW (ref >59)
Globulin, Total: 2.9 g/dL (ref 1.5–4.5)
Glucose: 120 mg/dL — ABNORMAL HIGH (ref 65–99)
Potassium: 3.6 mmol/L (ref 3.5–5.2)
Sodium: 141 mmol/L (ref 134–144)
Total Protein: 7 g/dL (ref 6.0–8.5)

## 2019-12-02 LAB — LIPID PANEL
Chol/HDL Ratio: 2.3 ratio (ref 0.0–4.4)
Cholesterol, Total: 212 mg/dL — ABNORMAL HIGH (ref 100–199)
HDL: 91 mg/dL (ref >39)
LDL Chol Calc (NIH): 98 mg/dL (ref 0–99)
Triglycerides: 134 mg/dL (ref 0–149)
VLDL Cholesterol Cal: 23 mg/dL (ref 5–40)

## 2019-12-19 ENCOUNTER — Ambulatory Visit (INDEPENDENT_AMBULATORY_CARE_PROVIDER_SITE_OTHER): Payer: Medicare Other | Admitting: Family Medicine

## 2019-12-19 ENCOUNTER — Encounter: Payer: Self-pay | Admitting: Family Medicine

## 2019-12-19 ENCOUNTER — Other Ambulatory Visit: Payer: Self-pay

## 2019-12-19 VITALS — BP 149/77 | HR 87 | Temp 98.0°F | Resp 17 | Ht 65.0 in | Wt 233.0 lb

## 2019-12-19 DIAGNOSIS — Z0001 Encounter for general adult medical examination with abnormal findings: Secondary | ICD-10-CM

## 2019-12-19 DIAGNOSIS — Z0000 Encounter for general adult medical examination without abnormal findings: Secondary | ICD-10-CM

## 2019-12-19 DIAGNOSIS — Z23 Encounter for immunization: Secondary | ICD-10-CM | POA: Diagnosis not present

## 2019-12-19 DIAGNOSIS — Z1211 Encounter for screening for malignant neoplasm of colon: Secondary | ICD-10-CM | POA: Diagnosis not present

## 2019-12-19 DIAGNOSIS — L603 Nail dystrophy: Secondary | ICD-10-CM

## 2019-12-19 NOTE — Patient Instructions (Signed)
° ° ° °  If you have lab work done today you will be contacted with your lab results within the next 2 weeks.  If you have not heard from us then please contact us. The fastest way to get your results is to register for My Chart. ° ° °IF you received an x-ray today, you will receive an invoice from [facility]. Please contact  Radiology at [phone] with questions or concerns regarding your invoice.  ° °IF you received labwork today, you will receive an invoice from [facility]. Please contact [facility] at [phone] with questions or concerns regarding your invoice.  ° °Our billing staff will not be able to assist you with questions regarding bills from these companies. ° °You will be contacted with the lab results as soon as they are available. The fastest way to get your results is to activate your My Chart account. Instructions are located on the last page of this paperwork. If you have not heard from us regarding the results in 2 weeks, please contact this office. °  ° ° ° °

## 2019-12-19 NOTE — Progress Notes (Signed)
QUICK REFERENCE INFORMATION: The ABCs of Providing the Annual Wellness Visit  CMS.gov Medicare Learning Network  [employer] Annual Wellness Visit  Subjective:   Tammy Mccullough is a 79 y.o. Female who presents for an Annual Wellness Visit.    Patient Active Problem List   Diagnosis Date Noted  . Body mass index (BMI) of 40.0-44.9 in adult ([place]) 10/25/2017  . 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 Glucose intolerance (impaired glucose tolerance) 04/21/2016  . Chronic renal insufficiency 09/09/2015  . Glaucoma 02/09/2013  . HTN (hypertension) 02/09/2013    Past Medical History:  Diagnosis Date  . Arthritis    B knees.  . Cataract   . Glaucoma   . Glucose intolerance (impaired glucose tolerance)   . Hypertension   . Renal insufficiency      Past Surgical History:  Procedure Laterality Date  . CATARACT EXTRACTION Right 07-2014  . CATARACT EXTRACTION Left 01/16/2016  . COLONOSCOPY     10 +yrs ago in [place], normal per pt  . VAGINAL DELIVERY     x2     Outpatient Medications Prior to Visit  Medication Sig Dispense Refill  . acetaminophen (TYLENOL) 500 MG tablet Take 500 mg by mouth every 8 (eight) hours as needed.    Marland Kitchen amLODipine (NORVASC) 5 MG tablet Take 1 tablet (5 mg total) by mouth daily. 90 tablet 1  . AZOPT 1 % ophthalmic suspension     . 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 olmesartan-hydrochlorothiazide (BENICAR HCT) 40-25 MG tablet Take 1 tablet by mouth daily. 90 tablet 1  . ZIOPTAN 0.0015 % SOLN     . aspirin 81 MG tablet Take 81 mg by mouth daily. Reported on 02/15/2016     No facility-administered medications prior to visit.    No Known Allergies   Family History  Problem Relation Age of Onset  . Diabetes Mother   . Heart disease Mother        unsure  . COPD Father   . Diabetes Sister   .  Hypertension Sister   . Kidney disease Sister   . Diabetes Brother   . Diabetes Brother   . Colon cancer Neg Hx   . Rectal cancer Neg Hx   . Stomach cancer Neg Hx      Social History   Socioeconomic History  . Marital status: Widowed    Spouse name: Not on file  . Number of children: 2  . Years of education: Not on file  . Highest education level: Not on file  Occupational History  . Occupation: retired  Tobacco Use  . Smoking status: Never Smoker  . Smokeless tobacco: Never Used  Substance and Sexual Activity  . Alcohol use: Yes    Alcohol/week: 6.0 standard drinks    Types: 6 Standard drinks or equivalent per week    Comment: 2-3  . Drug use: No  . Sexual activity: Not Currently  Other Topics Concern  . Not on file  Social History Narrative   Marital status: widowed since 2010 after 71 years of marriage.  Not dating but would like to be.      Children:  2 Children; no grandchildren; 2 adopted grandchildren.  One son is disabled; patient supports; son is obese.      Living: alone with 1 cat.  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      Tobacco: never      Alcohol: weekends liquor; some during the week.  3-5 cups per week.      Drugs: none      Exercise: water aerobics three days per week.  [employer].      Seatbelt: 100%;      Guns: none      ADLs:: independent with ADLs.   Drives. No assistant devices.      Advanced Directives: yes; healthcare POA:  Older brother [name]);  FULL CODE but no prolonged measures.     Social Determinants of Health   Financial Resource Strain:   . Difficulty of Paying Living Expenses:   Food Insecurity:   . Worried About Charity fundraiser in the Last Year:   . Arboriculturist in the Last Year:   Transportation Needs:   . Film/video editor (Medical):   Marland Kitchen Lack of Transportation (Non-Medical):   Physical Activity:   . Days of Exercise per Week:   .  Minutes of Exercise per Session:   Stress:   . Feeling of Stress :   Social Connections:   . Frequency of Communication with Friends and Family:   . Frequency of Social Gatherings with Friends and Family:   . Attends Religious Services:   . Active Member of Clubs or Organizations:   . Attends Archivist Meetings:   Marland Kitchen Marital Status:       Recent Hospitalizations? No  Health Habits: Current exercise activities include: none Exercise: 0 times/week. Diet: in general, a "healthy" diet    Alcohol intake: none  Health Risk Assessment: The patient has completed a Health Risk Assessment. This has been reveiwed with them and has been scanned into the [facility] system as an attached document.  Current Medical Providers and Suppliers: [facility] Patient Care Team: Forrest Moron, MD as PCP - General (Internal Medicine) Marylynn Pearson, MD as Consulting Physician (Ophthalmology) Future Appointments  Date Time Provider [place]  01/09/2020 10:00 AM Landis Martins, DPM [facility]  03/21/2020  2:40 PM Rutherford Guys, MD PCP-PCP [facility]     Age-appropriate Screening Schedule: The list below includes current immunization status and future screening recommendations based on patient's age. Orders for these recommended tests are listed in the plan section. The patient has been provided with a written plan. Immunization History  Administered Date(s) Administered  . Fluad Quad(high Dose 65+) 06/02/2019  . Influenza Split 05/03/2013, 05/11/2015  . Influenza, High Dose Seasonal PF 05/18/2018  . Influenza,inj,Quad PF,6+ Mos 07/25/2014, 04/21/2016, 04/27/2017  . PFIZER SARS-COV-2 Vaccination 09/23/2019, 10/14/2019  . Pneumococcal Conjugate-13 05/03/2013, 07/25/2014  . Pneumococcal Polysaccharide-23 12/19/2019  . Pneumococcal-Unspecified 05/03/2013  . Td 08/17/2005  . Tdap 06/02/2016  . Zoster 10/22/2015  . Zoster Recombinat (Shingrix) 01/15/2017, 06/17/2017, 07/21/2017     Health Maintenance reviewed -  See orders   Depression Screen-PHQ2/9 completed today  Depression screen [facility] 2/9 12/19/2019 12/01/2019 09/19/2019 06/02/2019 05/01/2019  Decreased Interest 0 0 0 0 0  Down, Depressed, Hopeless 0 0 0 0 0  PHQ - 2 Score 0 0 0 0 0       Depression Severity and Treatment Recommendations:  0-4= None  5-9= Mild / Treatment: Support, educate to call if worse; return in one month  10-14= Moderate / Treatment: Support, watchful waiting; Antidepressant or Psycotherapy  15-19= Moderately severe / Treatment: Antidepressant  OR Psychotherapy  >= 20 = Major depression, severe / Antidepressant AND Psychotherapy  Functional Status Survey:      Hearing Evaluation: 1. Do you have trouble hearing the television when others do not?   No 2. Do you have to strain to hear/understand conversations? No   Advanced Care Planning: 1. Patient has executed an Advance Directive: No 2. If no, patient was given the opportunity to execute an Advance Directive today? Yes 3. Are the patient's advanced directives in [place]? No 4. This patient has the ability to prepare an Advance Directive: Yes 5. Provider is willing to follow the patient's wishes: Yes  Cognitive Assessment: Does the patient have evidence of cognitive impairment? No The patient does not have any evidence of any cognitive problems and denies any  change in mood/affect, appearance, speech, memory or motor skills.  Identification of Risk Factors: Risk factors include: hypertension  ROS  Review of Systems  Constitutional: Negative for activity change, appetite change, chills and fever.  HENT: Negative for congestion, nosebleeds, trouble swallowing and voice change.   Respiratory: Negative for cough, shortness of breath and wheezing.   Gastrointestinal: Negative for diarrhea, nausea and vomiting.  Genitourinary: Negative for difficulty urinating, dysuria, flank pain and hematuria.  Musculoskeletal: Negative for  back pain, joint swelling and neck pain.  Neurological: Negative for dizziness, speech difficulty, light-headedness and numbness.  See HPI. All other review of systems negative.    Objective:   Vitals:   12/19/19 0854  BP: (!) 149/77  Pulse: 87  Resp: 17  Temp: 98 F (36.7 C)  TempSrc: Temporal  SpO2: 99%  Weight: 233 lb (105.7 kg)  Height: 5\' 5"  (1.651 m)    Body mass index is 38.77 kg/m.  Physical Exam  Constitutional: Oriented to person, place, and time. Appears well-developed and well-nourished.  HENT:  Head: Normocephalic and atraumatic.  Eyes: Conjunctivae and EOM are normal.  Cardiovascular: Normal rate, regular rhythm, normal heart sounds and intact distal pulses.  No murmur heard. Pulmonary/Chest: Effort normal and breath sounds normal. No stridor. No respiratory distress. Has no wheezes.  Neurological: Is alert and oriented to person, place, and time.  Skin: Skin is warm. Capillary refill takes less than 2 seconds. Hard thickened nails. Psychiatric: Has a normal mood and affect. Behavior is normal. Judgment and thought content normal.     Assessment/Plan:   Patient Self-Management and Personalized Health Advice The patient has been provided with information about:  attempt to lose weight and reduce salt in diet and cooking  During the course of the visit the patient was educated and counseled about appropriate screening and preventive services including:   lab testing as noted in orders section     Body mass index is 38.77 kg/m. Discussed the patient's BMI with her. The BMI BMI is not in the acceptable range; BMI management plan is completed  Tyshay was seen today for awv.  Diagnoses and all orders for this visit:  Encounter for Medicare annual wellness exam  Screening for colon cancer  Need for prophylactic vaccination against Streptococcus pneumoniae (pneumococcus) -     Pneumococcal polysaccharide vaccine 23-valent greater than or equal to  2yo subcutaneous/IM  Onychodystrophy -     Ambulatory referral to [facility] monthly breast exam and annual mammogram Advised dental exam every six months Discussed stress management Discussed health and wellness  Onychodystrophy Discussed nail care Will refer to Podiatry       No follow-ups on file.  Future Appointments  Date Time Provider [place]  01/09/2020 10:00 AM Landis Martins, DPM [facility]  03/21/2020  2:40 PM Rutherford Guys, MD PCP-PCP [facility] [place]    Patient Instructions       If you have lab work done today you will be contacted with your lab results within the next 2 weeks.  If you have not heard from [place] then please contact us. The fastest way to get your results is to register for My Chart.   IF you received an x-ray today, you will receive an invoice from [facility]. Please contact [facility] at [phone] with questions or concerns regarding your invoice.   IF you received labwork today, you will receive an invoice from [facility]. Please contact [facility] at [phone] with questions or concerns regarding your invoice.   Our billing staff will not be able to assist you with questions regarding bills from these companies.  You will be contacted with the lab results as soon as they are available. The fastest way to get your results is to activate your My Chart account. Instructions are located on the last page of this paperwork. If you have not heard from [place] regarding the results in 2 weeks, please contact this office.       An after visit summary with all of these plans was given to the patient.

## 2019-12-21 ENCOUNTER — Encounter: Payer: Medicare Other | Admitting: Family Medicine

## 2019-12-26 ENCOUNTER — Telehealth: Payer: Self-pay | Admitting: Family Medicine

## 2019-12-30 ENCOUNTER — Encounter: Payer: Self-pay | Admitting: Family Medicine

## 2020-01-09 ENCOUNTER — Ambulatory Visit (INDEPENDENT_AMBULATORY_CARE_PROVIDER_SITE_OTHER): Payer: Medicare Other | Admitting: Sports Medicine

## 2020-01-09 ENCOUNTER — Encounter: Payer: Self-pay | Admitting: Sports Medicine

## 2020-01-09 ENCOUNTER — Other Ambulatory Visit: Payer: Self-pay

## 2020-01-09 VITALS — Temp 97.4°F

## 2020-01-09 DIAGNOSIS — M79675 Pain in left toe(s): Secondary | ICD-10-CM

## 2020-01-09 DIAGNOSIS — B351 Tinea unguium: Secondary | ICD-10-CM

## 2020-01-09 DIAGNOSIS — M79674 Pain in right toe(s): Secondary | ICD-10-CM

## 2020-01-09 NOTE — Progress Notes (Signed)
Subjective: Tammy Mccullough is a 79 y.o. female patient seen today in office with complaint of mildly painful thickened and discolored nails. Patient is desiring treatment for nail changes; has tried OTC topicals/Medication [place]) in the past with no improvement. Reports that nails are becoming difficult to manage because of the thickness. Denies any trauma. Patient has no other pedal complaints at this time.   Review of Systems  All other systems reviewed and are negative.   Patient Active Problem List   Diagnosis Date Noted  . Body mass index (BMI) of 40.0-44.9 in adult ([place]) 10/25/2017  . 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 Glucose intolerance (impaired glucose tolerance) 04/21/2016  . Chronic renal insufficiency 09/09/2015  . Glaucoma 02/09/2013  . HTN (hypertension) 02/09/2013    Current Outpatient Medications on File Prior to Visit  Medication Sig Dispense Refill  . acetaminophen (TYLENOL) 500 MG tablet Take 500 mg by mouth every 8 (eight) hours as needed.    Marland Kitchen amLODipine (NORVASC) 5 MG tablet Take 1 tablet (5 mg total) by mouth daily. 90 tablet 1  . aspirin 81 MG tablet Take 81 mg by mouth daily. Reported on 02/15/2016    . AZOPT 1 % ophthalmic suspension     . 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 diclofenac Sodium (VOLTAREN) 1 % GEL Apply 2 g topically 4 (four) times daily.    Marland Kitchen olmesartan-hydrochlorothiazide (BENICAR HCT) 40-25 MG tablet Take 1 tablet by mouth daily. 90 tablet 1  . ZIOPTAN 0.0015 % SOLN      No current facility-administered medications on file prior to visit.    No Known Allergies  Objective: Physical Exam  General: Well developed, nourished, no acute distress, awake, alert and oriented x 3  Vascular: Dorsalis pedis artery 1/4 bilateral, Posterior tibial artery 1/4 bilateral, skin  temperature warm to warm proximal to distal bilateral lower extremities, no varicosities, pedal hair present bilateral.  Neurological: Gross sensation present via light touch bilateral.   Dermatological: Skin is warm, dry, and supple bilateral, Bilateral hallux nails are tender, short thick, and discolored with mild subungal debris with trauma lines and lifting on left 1st toenail, no webspace macerations present bilateral, no open lesions present bilateral, no callus/corns/hyperkeratotic tissue present bilateral. No signs of infection bilateral.  Musculoskeletal: Asymptomatic bunion and hammertoe boney deformities noted bilateral. Muscular strength within normal limits without painon range of motion. No pain with calf compression bilateral.  Assessment and Plan:  Problem List Items Addressed This Visit    None    Visit Diagnoses    Nail fungus    -  Primary   Toe pain, bilateral          -Examined patient -Discussed treatment options for painful dystrophic nails  -Fungal culture was obtained by removing a portion of the hard nail itself from each of the involved toenails bilateral hallux using a sterile nail nipper and sent to [facility] lab. Patient tolerated the biopsy procedure well without discomfort or need for anesthesia.  -Patient to return in 4 weeks for follow up evaluation and discussion of fungal culture results or sooner if symptoms worsen.  Landis Martins, DPM

## 2020-01-09 NOTE — Addendum Note (Signed)
Addended by: Celene Skeen A on: 01/09/2020 10:45 AM   Modules accepted: Orders

## 2020-01-29 ENCOUNTER — Other Ambulatory Visit: Payer: Self-pay | Admitting: Family Medicine

## 2020-01-29 DIAGNOSIS — I10 Essential (primary) hypertension: Secondary | ICD-10-CM

## 2020-01-29 NOTE — Telephone Encounter (Signed)
Medication Refill - Medication: amLODipine (NORVASC) 5 MG tablet   Has the patient contacted their pharmacy? Yes.   (Agent: If no, request that the patient contact the pharmacy for the refill.) (Agent: If yes, when and what did the pharmacy advise?)  Preferred Pharmacy (with phone number or street name):  [facility] [place], [address]  [place] [address]  Phone: [phone] Fax: [phone]     Agent: Please be advised that RX refills may take up to 3 business days. We ask that you follow-up with your pharmacy.

## 2020-01-30 ENCOUNTER — Other Ambulatory Visit: Payer: Self-pay

## 2020-01-30 DIAGNOSIS — I10 Essential (primary) hypertension: Secondary | ICD-10-CM

## 2020-01-30 MED ORDER — AMLODIPINE BESYLATE 5 MG PO TABS: 5.0000 mg | ORAL_TABLET | Freq: Every day | ORAL | 1 refills | Status: DC

## 2020-02-13 ENCOUNTER — Ambulatory Visit (INDEPENDENT_AMBULATORY_CARE_PROVIDER_SITE_OTHER): Payer: Medicare Other | Admitting: Sports Medicine

## 2020-02-13 ENCOUNTER — Encounter: Payer: Self-pay | Admitting: Sports Medicine

## 2020-02-13 ENCOUNTER — Other Ambulatory Visit: Payer: Self-pay

## 2020-02-13 DIAGNOSIS — B351 Tinea unguium: Secondary | ICD-10-CM

## 2020-02-13 DIAGNOSIS — M79674 Pain in right toe(s): Secondary | ICD-10-CM

## 2020-02-13 DIAGNOSIS — M79675 Pain in left toe(s): Secondary | ICD-10-CM | POA: Diagnosis not present

## 2020-02-13 NOTE — Progress Notes (Signed)
Subjective: Tammy Mccullough is a 79 y.o. female patient seen today in office for fungal culture results. Patient has no other pedal complaints at this time.   Patient Active Problem List   Diagnosis Date Noted  . Body mass index (BMI) of 40.0-44.9 in adult ([place]) 10/25/2017  . 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 Glucose intolerance (impaired glucose tolerance) 04/21/2016  . Chronic renal insufficiency 09/09/2015  . Glaucoma 02/09/2013  . HTN (hypertension) 02/09/2013    Current Outpatient Medications on File Prior to Visit  Medication Sig Dispense Refill  . acetaminophen (TYLENOL) 500 MG tablet Take 500 mg by mouth every 8 (eight) hours as needed.    Marland Kitchen amLODipine (NORVASC) 5 MG tablet Take 1 tablet (5 mg total) by mouth daily. 90 tablet 1  . aspirin 81 MG tablet Take 81 mg by mouth daily. Reported on 02/15/2016    . AZOPT 1 % ophthalmic suspension     . 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 diclofenac Sodium (VOLTAREN) 1 % GEL Apply 2 g topically 4 (four) times daily.    Marland Kitchen olmesartan-hydrochlorothiazide (BENICAR HCT) 40-25 MG tablet Take 1 tablet by mouth daily. 90 tablet 1  . ZIOPTAN 0.0015 % SOLN      No current facility-administered medications on file prior to visit.    No Known Allergies  Objective: Physical Exam  General: Well developed, nourished, no acute distress, awake, alert and oriented x 3  Vascular: Dorsalis pedis artery 1/4 bilateral, Posterior tibial artery 1/4 bilateral, skin temperature warm to warm proximal to distal bilateral lower extremities, no varicosities, pedal hair present bilateral.  Neurological: Gross sensation present via light touch bilateral.   Dermatological: Skin is warm, dry, and supple bilateral, Bilateral hallux nails are tender, short thick, and discolored with mild subungal  debris, no webspace macerations present bilateral, no open lesions present bilateral, no callus/corns/hyperkeratotic tissue present bilateral. No signs of infection bilateral.  Musculoskeletal: Asymptomatic bunion and hammertoe boney deformities noted bilateral. Muscular strength within normal limits without painon range of motion. No pain with calf compression bilateral  Fungal culture + fungus and trauma   Assessment and Plan:  Problem List Items Addressed This Visit    None    Visit Diagnoses    Nail fungus    -  Primary   Toe pain, bilateral          -Examined patient -Discussed treatment options for painful mycotic nails -[place] results reviewed  -Patient opt for laser at this time but also encouraged her to consider Tolcylen or Formula 3  -Advised good hygiene habits -Patient to return for laser or sooner if symptoms worsen.  Landis Martins, DPM

## 2020-02-16 ENCOUNTER — Encounter: Payer: Self-pay | Admitting: Family Medicine

## 2020-02-16 ENCOUNTER — Other Ambulatory Visit: Payer: Self-pay

## 2020-02-16 ENCOUNTER — Other Ambulatory Visit: Payer: Medicare Other

## 2020-02-16 ENCOUNTER — Ambulatory Visit (INDEPENDENT_AMBULATORY_CARE_PROVIDER_SITE_OTHER): Payer: Medicare Other | Admitting: Family Medicine

## 2020-02-16 VITALS — BP 148/79 | HR 98 | Temp 97.6°F | Ht 65.5 in | Wt 235.0 lb

## 2020-02-16 DIAGNOSIS — M62830 Muscle spasm of back: Secondary | ICD-10-CM | POA: Diagnosis not present

## 2020-02-16 DIAGNOSIS — G8929 Other chronic pain: Secondary | ICD-10-CM

## 2020-02-16 DIAGNOSIS — M545 Low back pain: Secondary | ICD-10-CM | POA: Diagnosis not present

## 2020-02-16 LAB — POCT URINALYSIS DIP (MANUAL ENTRY)
Bilirubin, UA: NEGATIVE
Blood, UA: NEGATIVE
Glucose, UA: NEGATIVE mg/dL
Ketones, POC UA: NEGATIVE mg/dL
Leukocytes, UA: NEGATIVE
Nitrite, UA: NEGATIVE
Protein Ur, POC: NEGATIVE mg/dL
Spec Grav, UA: 1.02 (ref 1.010–1.025)
Urobilinogen, UA: 0.2 E.U./dL
pH, UA: 5 (ref 5.0–8.0)

## 2020-02-16 MED ORDER — METHOCARBAMOL 500 MG PO TABS: ORAL_TABLET | ORAL | 0 refills | Status: DC

## 2020-02-16 NOTE — Progress Notes (Signed)
Patient ID: Tammy Mccullough, female    DOB: 1941/08/13  Age: 79 y.o. MRN: [id number]  Chief Complaint  Patient presents with  . Back Pain    Pt stated that she has been having some lower back pain that started bout 3/4 days ago and she did not do anything to hurt it.     Subjective:   79 year old lady who has had 3 or 4 days of back pain.  The first day just started tightening up on the right side of her back.  Then it moved more close to the middle, then across side.  She thought she been sick normal something to make it hurt.  She has not had back pain problems like this in the past.  She spends a lot of sitting and reading.  She does not do a lot of physical labor though she feels like she gets enough exercise.  She does own housework.  She lives alone with her cat.  She tried Tylenol and it did not help a lot so she went to some Aleve and is doing better.  Current allergies, medications, problem list, past/family and social histories reviewed.  Objective:  BP (!) 148/79   Pulse 98   Temp 97.6 F (36.4 C) (Temporal)   Ht 5' 5.5" (1.664 m)   Wt 235 lb (106.6 kg)   SpO2 97%   BMI 38.51 kg/m   No acute distress.  Back has very good range of motion.  Mild pain on anterior flexion.  No tenderness to percussion.  Abdomen soft nontender.  Straight leg raise test negative. Assessment & Plan:   Assessment: 1. Muscle spasm of back   2. Chronic bilateral low back pain without sciatica       Plan: Pretty clearly a muscular type back pain.  I not believe any x-rays are needed.  Told her that muscle relaxants can be a problem in elderly, so I would recommend that she just start with the Aleve since it is is helped her and if she is not doing better than try the muscle relaxant.  If she does not improve would have to get PT order.  Orders Placed This Encounter  Procedures  . POCT urinalysis dipstick   Urine dipstick was normal No orders of the defined types were placed in this  encounter.        Patient Instructions       If you have lab work done today you will be contacted with your lab results within the next 2 weeks.  If you have not heard from [place] then please contact us. The fastest way to get your results is to register for My Chart.   IF you received an x-ray today, you will receive an invoice from [facility]. Please contact [facility] at [phone] with questions or concerns regarding your invoice.   IF you received labwork today, you will receive an invoice from [facility]. Please contact [facility] at [phone] with questions or concerns regarding your invoice.   Our billing staff will not be able to assist you with questions regarding bills from these companies.  You will be contacted with the lab results as soon as they are available. The fastest way to get your results is to activate your My Chart account. Instructions are located on the last page of this paperwork. If you have not heard from [place] regarding the results in 2 weeks, please contact this office.        No follow-ups on  file.   Ruben Reason, MD 02/16/2020

## 2020-02-16 NOTE — Patient Instructions (Addendum)
  Continue taking the Aleve (naproxen) 1 or 2 pills twice daily with food for several days if needed for the back pain.  Do not exceed 4 pills in 24 hours  You can take Tylenol (acetaminophen) 500 mg 2 pills 3 times daily if needed for additional pain.  This can be taken while you are still on the Aleve.  Do not exceed 3000 mg in 24 hours.  I have prescribed some Robaxin (methocarbamol) for you.  However these can be too sedating sometimes in elderly so you might try starting with just one half of a pill.  However if you do not need the muscle relaxant do not take it at all.  Return as needed    If you have lab work done today you will be contacted with your lab results within the next 2 weeks.  If you have not heard from [place] then please contact us. The fastest way to get your results is to register for My Chart.   IF you received an x-ray today, you will receive an invoice from [facility]. Please contact [facility] at [phone] with questions or concerns regarding your invoice.   IF you received labwork today, you will receive an invoice from [place]. Please contact [facility] at [phone] with questions or concerns regarding your invoice.   Our billing staff will not be able to assist you with questions regarding bills from these companies.  You will be contacted with the lab results as soon as they are available. The fastest way to get your results is to activate your My Chart account. Instructions are located on the last page of this paperwork. If you have not heard from [place] regarding the results in 2 weeks, please contact this office.

## 2020-03-01 ENCOUNTER — Ambulatory Visit: Payer: Medicare Other | Admitting: Family Medicine

## 2020-03-21 ENCOUNTER — Other Ambulatory Visit: Payer: Self-pay

## 2020-03-21 ENCOUNTER — Ambulatory Visit (INDEPENDENT_AMBULATORY_CARE_PROVIDER_SITE_OTHER): Payer: Medicare Other | Admitting: Family Medicine

## 2020-03-21 ENCOUNTER — Encounter: Payer: Self-pay | Admitting: Family Medicine

## 2020-03-21 VITALS — BP 140/83 | HR 80 | Temp 98.2°F | Ht 65.5 in | Wt 230.0 lb

## 2020-03-21 DIAGNOSIS — I10 Essential (primary) hypertension: Secondary | ICD-10-CM | POA: Diagnosis not present

## 2020-03-21 DIAGNOSIS — N1832 Chronic kidney disease, stage 3b: Secondary | ICD-10-CM | POA: Diagnosis not present

## 2020-03-21 DIAGNOSIS — M170 Bilateral primary osteoarthritis of knee: Secondary | ICD-10-CM | POA: Diagnosis not present

## 2020-03-21 MED ORDER — AMLODIPINE BESYLATE 5 MG PO TABS: 5.0000 mg | ORAL_TABLET | Freq: Every day | ORAL | 1 refills | Status: DC

## 2020-03-21 MED ORDER — DICLOFENAC SODIUM 1 % EX GEL: 2.0000 g | Freq: Four times a day (QID) | CUTANEOUS | 3 refills | Status: DC

## 2020-03-21 MED ORDER — OLMESARTAN MEDOXOMIL-HCTZ 40-25 MG PO TABS: 1.0000 | ORAL_TABLET | Freq: Every day | ORAL | 1 refills | Status: DC

## 2020-03-21 NOTE — Progress Notes (Signed)
8/5/20212:51 PM  Tammy Mccullough 1941-07-08, 79 y.o., female [id number]  Chief Complaint  Patient presents with  . Hypertension    HPI:   Patient is a 79 y.o. female with past medical history significant for HTN, prediabetes, CKD3, glaucoma, Bilateral knee OA who presents today for [facility]  Previous PCP Dr Nolon Rod, last OV April 2021 - no changes  overall doing well  She has no acute concerns today Checks BP 2-3 x week, 120-140/80 Takes amlodipine, olmesartan and hctz at night She exercises at home for her knees She uses diclofenac gel on her knees [facility] - [facility] Had covid vaccine  BP Readings from Last 3 Encounters:  03/21/20 140/83  02/16/20 (!) 148/79  12/19/19 (!) 149/77    Lab Results  Component Value Date   HGBA1C 5.4 10/25/2017   HGBA1C 5.3 10/20/2016   HGBA1C 5.3 04/21/2016   Lab Results  Component Value Date   LDLCALC 98 12/01/2019   CREATININE 1.45 (H) 12/01/2019    Depression screen PHQ 2/9 03/21/2020 02/16/2020 12/19/2019  Decreased Interest 0 0 0  Down, Depressed, Hopeless 0 0 0  PHQ - 2 Score 0 0 0    Fall Risk  03/21/2020 02/16/2020 12/19/2019 12/01/2019 09/19/2019  Falls in the past year? 0 0 0 0 0  Comment - - - - -  Number falls in past yr: 0 0 0 0 0  Injury with Fall? 0 0 0 0 0  Follow up - Falls evaluation completed Falls evaluation completed Falls evaluation completed Falls evaluation completed     No Known Allergies  Prior to Admission medications   Medication Sig Start Date End Date Taking? Authorizing Provider  acetaminophen (TYLENOL) 500 MG tablet Take 500 mg by mouth every 8 (eight) hours as needed.   Yes [provider]  amLODipine (NORVASC) 5 MG tablet Take 1 tablet (5 mg total) by mouth daily. 01/30/20  Yes Rutherford Guys, MD  aspirin 81 MG tablet Take 81 mg by mouth daily. Reported on 02/15/2016   Yes [provider]  AZOPT 1 % ophthalmic suspension  10/03/16  Yes [provider]  diclofenac Sodium  (VOLTAREN) 1 % GEL Apply 2 g topically 4 (four) times daily. 12/28/19  Yes [provider]  methocarbamol (ROBAXIN) 500 MG tablet Take one pill 3 times daily as needed for muscle relaxant 02/16/20  Yes Posey Boyer, MD  olmesartan-hydrochlorothiazide (BENICAR HCT) 40-25 MG tablet Take 1 tablet by mouth daily. 12/01/19  Yes Forrest Moron, MD  ZIOPTAN 0.0015 % SOLN  09/26/16  Yes [provider]    Past Medical History:  Diagnosis Date  . Arthritis    B knees.  . Cataract   . Glaucoma   . Glucose intolerance (impaired glucose tolerance)   . Hypertension   . Renal insufficiency     Past Surgical History:  Procedure Laterality Date  . CATARACT EXTRACTION Right 07-2014  . CATARACT EXTRACTION Left 01/16/2016  . COLONOSCOPY     10 +yrs ago in [place], normal per pt  . VAGINAL DELIVERY     x2    Social History   Tobacco Use  . Smoking status: Never Smoker  . Smokeless tobacco: Never Used  Substance Use Topics  . Alcohol use: Yes    Alcohol/week: 6.0 standard drinks    Types: 6 Standard drinks or equivalent per week    Comment: 2-3    Family History  Problem Relation Age of Onset  . Diabetes Mother   .  Heart disease Mother        unsure  . COPD Father   . Diabetes Sister   . Hypertension Sister   . Kidney disease Sister   . Diabetes Brother   . Diabetes Brother   . Colon cancer Neg Hx   . Rectal cancer Neg Hx   . Stomach cancer Neg Hx     Review of Systems  Constitutional: Negative for chills and fever.  Respiratory: Negative for cough and shortness of breath.   Cardiovascular: Negative for chest pain, palpitations and leg swelling.  Gastrointestinal: Negative for abdominal pain, nausea and vomiting.     OBJECTIVE:  Today's Vitals   03/21/20 1434  BP: 140/83  Pulse: 80  Temp: 98.2 F (36.8 C)  SpO2: 96%  Weight: 230 lb (104.3 kg)  Height: 5' 5.5" (1.664 m)   Body mass index is 37.69 kg/m.   Physical Exam Vitals and nursing note  reviewed.  Constitutional:      Appearance: She is well-developed.  HENT:     Head: Normocephalic and atraumatic.     Mouth/Throat:     Pharynx: No oropharyngeal exudate.  Eyes:     General: No scleral icterus.    Conjunctiva/sclera: Conjunctivae normal.     Pupils: Pupils are equal, round, and reactive to light.  Cardiovascular:     Rate and Rhythm: Normal rate and regular rhythm.     Heart sounds: Normal heart sounds. No murmur heard.  No friction rub. No gallop.   Pulmonary:     Effort: Pulmonary effort is normal.     Breath sounds: Normal breath sounds. No wheezing, rhonchi or rales.  Musculoskeletal:     Cervical back: Neck supple.     Right lower leg: Edema (+ trace pitting) present.     Left lower leg: Edema present.  Skin:    General: Skin is warm and dry.  Neurological:     Mental Status: She is alert and oriented to person, place, and time.     No results found for this or any previous visit (from the past 24 hour(s)).  No results found.   ASSESSMENT and PLAN  1. Essential hypertension Controlled. Continue current regime.  - amLODipine (NORVASC) 5 MG tablet; Take 1 tablet (5 mg total) by mouth daily. - olmesartan-hydrochlorothiazide (BENICAR HCT) 40-25 MG tablet; Take 1 tablet by mouth daily. - TSH - Lipid panel - CMP14+EGFR  2. Chronic renal impairment, stage 3b Stable. Labs pending. discussed avoidance of nephrotoxins  3. Bilateral primary osteoarthritis of knee - diclofenac Sodium (VOLTAREN) 1 % GEL; Apply 2 g topically 4 (four) times daily.  Return in about 6 months (around 09/21/2020).    Rutherford Guys, MD [facility] [place], [place] [address] Ph.  [phone] Fax [phone]

## 2020-03-21 NOTE — Patient Instructions (Signed)
pcp      If you have lab work done today you will be contacted with your lab results within the next 2 weeks.  If you have not heard from [place] then please contact us. The fastest way to get your results is to register for My Chart.   IF you received an x-ray today, you will receive an invoice from [facility]. Please contact [facility] at [phone] with questions or concerns regarding your invoice.   IF you received labwork today, you will receive an invoice from [place]. Please contact [facility] at [phone] with questions or concerns regarding your invoice.   Our billing staff will not be able to assist you with questions regarding bills from these companies.  You will be contacted with the lab results as soon as they are available. The fastest way to get your results is to activate your My Chart account. Instructions are located on the last page of this paperwork. If you have not heard from [place] regarding the results in 2 weeks, please contact this office.        If you have lab work done today you will be contacted with your lab results within the next 2 weeks.  If you have not heard from [place] then please contact us. The fastest way to get your results is to register for My Chart.   IF you received an x-ray today, you will receive an invoice from [facility]. Please contact [facility] at [phone] with questions or concerns regarding your invoice.   IF you received labwork today, you will receive an invoice from [place]. Please contact [facility] at [phone] with questions or concerns regarding your invoice.   Our billing staff will not be able to assist you with questions regarding bills from these companies.  You will be contacted with the lab results as soon as they are available. The fastest way to get your results is to activate your My Chart account. Instructions are located on the last page of this paperwork. If you have not heard  from [place] regarding the results in 2 weeks, please contact this office.

## 2020-03-22 ENCOUNTER — Ambulatory Visit (INDEPENDENT_AMBULATORY_CARE_PROVIDER_SITE_OTHER): Payer: Medicare Other | Admitting: *Deleted

## 2020-03-22 DIAGNOSIS — B351 Tinea unguium: Secondary | ICD-10-CM

## 2020-03-22 LAB — CMP14+EGFR
ALT: 8 IU/L (ref 0–32)
AST: 16 IU/L (ref 0–40)
Albumin/Globulin Ratio: 1.7 (ref 1.2–2.2)
Albumin: 4.5 g/dL (ref 3.6–4.6)
Alkaline Phosphatase: 62 IU/L (ref 48–121)
BUN/Creatinine Ratio: 20 (ref 12–28)
BUN: 27 mg/dL (ref 8–27)
Bilirubin Total: 0.9 mg/dL (ref 0.0–1.2)
CO2: 23 mmol/L (ref 20–29)
Calcium: 9.7 mg/dL (ref 8.7–10.3)
Chloride: 104 mmol/L (ref 96–106)
Creatinine, Ser: 1.33 mg/dL — ABNORMAL HIGH (ref 0.57–1.00)
GFR calc Af Amer: 43 mL/min/{1.73_m2} — ABNORMAL LOW (ref >59)
GFR calc non Af Amer: 38 mL/min/{1.73_m2} — ABNORMAL LOW (ref >59)
Globulin, Total: 2.6 g/dL (ref 1.5–4.5)
Glucose: 102 mg/dL — ABNORMAL HIGH (ref 65–99)
Potassium: 4 mmol/L (ref 3.5–5.2)
Sodium: 140 mmol/L (ref 134–144)
Total Protein: 7.1 g/dL (ref 6.0–8.5)

## 2020-03-22 LAB — LIPID PANEL
Chol/HDL Ratio: 2.2 ratio (ref 0.0–4.4)
Cholesterol, Total: 210 mg/dL — ABNORMAL HIGH (ref 100–199)
HDL: 96 mg/dL (ref >39)
LDL Chol Calc (NIH): 99 mg/dL (ref 0–99)
Triglycerides: 89 mg/dL (ref 0–149)
VLDL Cholesterol Cal: 15 mg/dL (ref 5–40)

## 2020-03-22 LAB — TSH: TSH: 1.31 u[IU]/mL (ref 0.450–4.500)

## 2020-03-22 NOTE — Patient Instructions (Signed)

## 2020-03-22 NOTE — Progress Notes (Signed)
Patient presents today for the 1st laser treatment. Diagnosed with mycotic nail infection by Dr. Cannon Kettle.   Toenail most affected are the hallux nails bilateral.  All other systems are negative.  Nails were filed thin. Laser therapy was administered to 1st toenails bilateral and patient tolerated the treatment well. All safety precautions were in place.    Follow up in 4 weeks for laser # 2.  Picture of nails taken today to document visual progress

## 2020-04-19 ENCOUNTER — Ambulatory Visit (INDEPENDENT_AMBULATORY_CARE_PROVIDER_SITE_OTHER): Payer: Medicare Other | Admitting: *Deleted

## 2020-04-19 ENCOUNTER — Other Ambulatory Visit: Payer: Self-pay

## 2020-04-19 DIAGNOSIS — B351 Tinea unguium: Secondary | ICD-10-CM

## 2020-04-19 NOTE — Progress Notes (Signed)
Patient presents today for the 2nd laser treatment. Diagnosed with mycotic nail infection by Dr. Cannon Kettle.   Toenail most affected are the hallux nails bilateral.   All other systems are negative.  Nails were filed thin. Laser therapy was administered to 1st toenails bilateral and patient tolerated the treatment well. All safety precautions were in place.    Follow up in 4 weeks for laser # 3

## 2020-05-20 ENCOUNTER — Ambulatory Visit (INDEPENDENT_AMBULATORY_CARE_PROVIDER_SITE_OTHER): Payer: Medicare Other | Admitting: *Deleted

## 2020-05-20 ENCOUNTER — Other Ambulatory Visit: Payer: Self-pay

## 2020-05-20 DIAGNOSIS — B351 Tinea unguium: Secondary | ICD-10-CM

## 2020-05-20 NOTE — Progress Notes (Signed)
Patient presents today for the 3rd laser treatment. Diagnosed with mycotic nail infection by Dr. Cannon Kettle.   Toenail most affected are the hallux nails bilateral.   The nails are improving.  All other systems are negative.  Nails were filed thin. Laser therapy was administered to 1st toenails bilateral and patient tolerated the treatment well. All safety precautions were in place.    Follow up in 6 weeks for laser #4.

## 2020-07-01 ENCOUNTER — Other Ambulatory Visit: Payer: Self-pay

## 2020-07-01 ENCOUNTER — Ambulatory Visit (INDEPENDENT_AMBULATORY_CARE_PROVIDER_SITE_OTHER): Payer: Medicare Other | Admitting: *Deleted

## 2020-07-01 DIAGNOSIS — B351 Tinea unguium: Secondary | ICD-10-CM

## 2020-07-01 NOTE — Progress Notes (Signed)
Patient presents today for the 4th laser treatment. Diagnosed with mycotic nail infection by Dr. Cannon Kettle.   Toenail most affected are the hallux nails bilateral. The nails are clearing, but very slow.  The nails are improving.  All other systems are negative.  Nails were filed thin. Laser therapy was administered to 1st toenails bilateral and patient tolerated the treatment well. All safety precautions were in place.    Follow up in 6 weeks for laser #5.

## 2020-08-28 ENCOUNTER — Ambulatory Visit (INDEPENDENT_AMBULATORY_CARE_PROVIDER_SITE_OTHER): Payer: Medicare Other

## 2020-08-28 ENCOUNTER — Encounter: Payer: Self-pay | Admitting: Family Medicine

## 2020-08-28 ENCOUNTER — Ambulatory Visit (INDEPENDENT_AMBULATORY_CARE_PROVIDER_SITE_OTHER): Payer: Medicare Other | Admitting: Family Medicine

## 2020-08-28 ENCOUNTER — Other Ambulatory Visit: Payer: Self-pay

## 2020-08-28 VITALS — BP 130/76 | HR 98 | Temp 97.7°F | Ht 65.0 in | Wt 233.0 lb

## 2020-08-28 DIAGNOSIS — I10 Essential (primary) hypertension: Secondary | ICD-10-CM | POA: Diagnosis not present

## 2020-08-28 DIAGNOSIS — M25562 Pain in left knee: Secondary | ICD-10-CM | POA: Diagnosis not present

## 2020-08-28 DIAGNOSIS — N1832 Chronic kidney disease, stage 3b: Secondary | ICD-10-CM | POA: Diagnosis not present

## 2020-08-28 DIAGNOSIS — M170 Bilateral primary osteoarthritis of knee: Secondary | ICD-10-CM

## 2020-08-28 DIAGNOSIS — M25561 Pain in right knee: Secondary | ICD-10-CM

## 2020-08-28 DIAGNOSIS — G8929 Other chronic pain: Secondary | ICD-10-CM | POA: Diagnosis not present

## 2020-08-28 DIAGNOSIS — M545 Low back pain, unspecified: Secondary | ICD-10-CM

## 2020-08-28 DIAGNOSIS — M47816 Spondylosis without myelopathy or radiculopathy, lumbar region: Secondary | ICD-10-CM | POA: Diagnosis not present

## 2020-08-28 DIAGNOSIS — M1711 Unilateral primary osteoarthritis, right knee: Secondary | ICD-10-CM | POA: Diagnosis not present

## 2020-08-28 DIAGNOSIS — M1712 Unilateral primary osteoarthritis, left knee: Secondary | ICD-10-CM | POA: Diagnosis not present

## 2020-08-28 MED ORDER — DICLOFENAC SODIUM 1 % EX GEL: 2.0000 g | Freq: Four times a day (QID) | CUTANEOUS | 3 refills | Status: AC

## 2020-08-28 MED ORDER — AMLODIPINE BESYLATE 5 MG PO TABS
5.0000 mg | ORAL_TABLET | Freq: Every day | ORAL | 1 refills | Status: DC
Start: 2020-08-28 — End: 2021-07-21

## 2020-08-28 MED ORDER — METHOCARBAMOL 500 MG PO TABS: ORAL_TABLET | ORAL | 0 refills | Status: DC

## 2020-08-28 MED ORDER — OLMESARTAN MEDOXOMIL-HCTZ 40-25 MG PO TABS: 1.0000 | ORAL_TABLET | Freq: Every day | ORAL | 1 refills | Status: DC

## 2020-08-28 NOTE — Progress Notes (Signed)
siff

## 2020-08-28 NOTE — Progress Notes (Signed)
Subjective:  Patient ID: Tammy Mccullough, female    DOB: [birth year]  Age: 80 y.o. MRN: [id number]  CC:  Chief Complaint  Patient presents with  . stiffness in legs and back     Went to PT and it did help. Used to do water aerobics, but so many things have shut down    HPI Tammy Mccullough presents for  New patient, previously seen by Dr. Linna Darner, Dr. Pamella Pert, Dr. Nolon Rod.  History of hypertension with CKD 3B, on amlodipine, olmesartan, hydrochlorothiazide.   Hypertension: On meds above. No missed doses  Or new side effects.  Home readings: 130-40/70's.  BP Readings from Last 3 Encounters:  08/28/20 130/76  03/21/20 140/83  02/16/20 (!) 148/79   Lab Results  Component Value Date   CREATININE 1.33 (H) 03/21/2020       Diclofenac gel for knee pain, bilateral osteoarthritis - 3-4 times per day. Helps some.  PT in 04/2019 and 09/2019 for knee pain. Helped some. Has not seen ortho for knee pain.  Last imaging in 2018 for L knee with mod degen changes, small effusion. Pain about the same as prior to PT. Knee and back ache at bedtime. Slower activity, but not limited. No assistive devices. No prior injections.   Water aerobics 5 times per week prior to pandemic, helped. Unable to go now.  History of chronic low back pain with spasms, and knee pain.   Evaluated by Dr. Linna Darner in July of last year for back - tylenol, Robaxin prescribed previously. Pain radiates to left thigh with sleep at times.  No bowel or bladder incontinence, no saddle anesthesia, no lower extremity weakness.  No unexplained wt loss, night sweats, fevers  No prior back injection or surgery.  Back pain noted if sitting for a long time, then getting up.  Tylenol 1071m QHS. Mm relaxant helped last year, no side effects.        History Patient Active Problem List   Diagnosis Date Noted  . Body mass index (BMI) of 40.0-44.9 in adult ([place] 10/25/2017  . 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 Glucose intolerance (impaired glucose tolerance) 04/21/2016  . Chronic renal insufficiency 09/09/2015  . Glaucoma 02/09/2013  . HTN (hypertension) 02/09/2013   Past Medical History:  Diagnosis Date  . Arthritis    B knees.  . Cataract   . Glaucoma   . Glucose intolerance (impaired glucose tolerance)   . Hypertension   . Renal insufficiency    Past Surgical History:  Procedure Laterality Date  . CATARACT EXTRACTION Right 07-2014  . CATARACT EXTRACTION Left 01/16/2016  . COLONOSCOPY     10 +yrs ago in [place], normal per pt  . VAGINAL DELIVERY     x2   No Known Allergies Prior to Admission medications   Medication Sig Start Date End Date Taking? Authorizing Provider  acetaminophen (TYLENOL) 500 MG tablet Take 500 mg by mouth every 8 (eight) hours as needed.   Yes [provider]  amLODipine (NORVASC) 5 MG tablet Take 1 tablet (5 mg total) by mouth daily. 03/21/20  Yes SJacelyn Pi Irma M, MD  AZOPT 1 % ophthalmic suspension  10/03/16  Yes [provider]  diclofenac Sodium (VOLTAREN) 1 % GEL Apply 2 g topically 4 (four) times daily. 03/21/20  Yes SJacelyn Pi ILilia Argue MD  olmesartan-hydrochlorothiazide (BENICAR HCT) 40-25 MG tablet Take 1 tablet  by mouth daily. 03/21/20  Yes Jacelyn Pi, Lilia Argue, MD  ZIOPTAN 0.0015 % SOLN  09/26/16  Yes [provider]  aspirin 81 MG tablet Take 81 mg by mouth daily. Reported on 02/15/2016 Patient not taking: Reported on 08/28/2020    [provider]  methocarbamol (ROBAXIN) 500 MG tablet Take one pill 3 times daily as needed for muscle relaxant Patient not taking: Reported on 08/28/2020 02/16/20   Posey Boyer, MD   Social History   Socioeconomic History  . Marital status: Widowed    Spouse name: Not on file  . Number of children: 2  . Years of education: Not on file  . Highest education level: Not on file  Occupational  History  . Occupation: retired  Tobacco Use  . Smoking status: Never Smoker  . Smokeless tobacco: Never Used  Substance and Sexual Activity  . Alcohol use: Yes    Alcohol/week: 6.0 standard drinks    Types: 6 Standard drinks or equivalent per week    Comment: 2-3  . Drug use: No  . Sexual activity: Not Currently  Other Topics Concern  . Not on file  Social History Narrative   Marital status: widowed since 2010 after 29 years of marriage.  Not dating but would like to be.      Children:  2 Children; no grandchildren; 2 adopted grandchildren.  One son is disabled; patient supports; son is obese.      Living: alone with 1 cat.      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      Tobacco: never      Alcohol: weekends liquor; some during the week.  3-5 cups per week.      Drugs: none      Exercise: water aerobics three days per week.  [employer].      Seatbelt: 100%;      Guns: none      ADLs:: independent with ADLs.   Drives. No assistant devices.      Advanced Directives: yes; healthcare POA:  Older brother [name]);  FULL CODE but no prolonged measures.     Social Determinants of Health   Financial Resource Strain: Not on file  Food Insecurity: Not on file  Transportation Needs: Not on file  Physical Activity: Not on file  Stress: Not on file  Social Connections: Not on file  Intimate Partner Violence: Not on file    Review of Systems  Constitutional: Negative for fatigue and unexpected weight change.  Respiratory: Negative for chest tightness and shortness of breath.   Cardiovascular: Negative for chest pain, palpitations and leg swelling.  Gastrointestinal: Negative for abdominal pain and blood in stool.  Neurological: Negative for dizziness, syncope, light-headedness and headaches.     Objective:   Vitals:   08/28/20 1557 08/28/20 1703  BP: (!) 154/77 130/76  Pulse: 98   Temp: 97.7 F (36.5 C)    TempSrc: Temporal   SpO2: 98%   Weight: 233 lb (105.7 kg)   Height: _0  (1.651 m)      Physical Exam Vitals reviewed.  Constitutional:      Appearance: She is well-developed and well-nourished. She is obese.  HENT:     Head: Normocephalic and atraumatic.  Eyes:     Extraocular Movements: EOM normal.     Conjunctiva/sclera: Conjunctivae normal.     Pupils: Pupils are equal, round, and reactive to light.  Neck:  Vascular: No carotid bruit.  Cardiovascular:     Rate and Rhythm: Normal rate and regular rhythm.     Pulses: Intact distal pulses.     Heart sounds: Normal heart sounds.  Pulmonary:     Effort: Pulmonary effort is normal.     Breath sounds: Normal breath sounds.  Abdominal:     Palpations: Abdomen is soft. There is no pulsatile mass.     Tenderness: There is no abdominal tenderness.  Musculoskeletal:        General: Tenderness present.     Right lower leg: Edema (Trace bilateral pedal edema at ankles.  No wounds.) present.     Left lower leg: Edema present.     Comments: Lumbar spine overall intact range of motion, slight discomfort with lateral flexion bilaterally.  No midline focal bony tenderness.  Describes a discomfort in the paraspinals of lower lumbar spine.  Negative seated straight leg raise.  Able to heel and toe walk.  Bilateral knee exam, tender to palpation at medial and lateral joint lines.  Slight decreased flexion of left versus right at approximately 80 degrees.  Skin:    General: Skin is warm and dry.  Neurological:     Mental Status: She is alert and oriented to person, place, and time.  Psychiatric:        Mood and Affect: Mood and affect normal.        Behavior: Behavior normal.     DG Lumbar Spine 2-3 Views  Result Date: 08/28/2020 CLINICAL DATA:  Chronic lower back pain EXAM: LUMBAR SPINE - 2-3 VIEW COMPARISON:  None. FINDINGS: There is no evidence of lumbar spine fracture. Straightening of the normal lumbar lordosis. Multilevel  degenerative change of the lumbar spine with disc space narrowing facet hypertrophy and osteophytosis most significant at L2-L3 and L5-S1 rate is moderate to severe. 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 Electronically Signed   By: Dahlia Bailiff MD   On: 08/28/2020 17:43   DG Knee Complete 4 Views Left  Result Date: 08/28/2020 CLINICAL DATA:  Long-standing bilateral knee pain EXAM: LEFT KNEE - COMPLETE 4+ VIEW COMPARISON:  Knee radiograph April 27, 2017. FINDINGS: No evidence of fracture, dislocation, or joint effusion. Similar tricompartment degenerative change left knee. Soft tissues are unremarkable. IMPRESSION: Similar moderate tricompartment degenerative change left knee. Electronically Signed   By: Dahlia Bailiff MD   On: 08/28/2020 17:37   DG Knee Complete 4 Views Right  Result Date: 08/28/2020 CLINICAL DATA:  Chronic bilateral knee pain. EXAM: RIGHT KNEE - COMPLETE 4+ VIEW COMPARISON:  None. FINDINGS: There is small joint effusion. There is tricompartment degenerative change most significant in the medial compartment where it is severe with with apparent bone on bone articulation and and large subchondral cyst. Moderate lateral and patellofemoral degenerative change. Vascular calcifications. IMPRESSION: Severe degenerative change of the medial compartment with moderate degenerative change of the lateral and patellofemoral compartments. Electronically Signed   By: Dahlia Bailiff MD   On: 08/28/2020 17:40      Assessment & Plan:  Inez Pilgrim Castrogiovanni is a 80 y.o. female . Bilateral primary osteoarthritis of knee - Plan: DG Knee Complete 4 Views Left, DG Knee Complete 4 Views Right, diclofenac Sodium (VOLTAREN) 1 % GEL, Ambulatory referral to Orthopedic Surgery Pain in both knees, unspecified chronicity - Plan: DG Knee Complete 4 Views Left, DG Knee Complete 4 Views Right, Ambulatory referral to Orthopedic  Surgery  -Longstanding bilateral knee  pain, minimal change with physical therapy, has continued Voltaren gel.  Minimal improvement.  Anticipate possible trial of corticosteroid injection versus viscosupplementation, will refer to orthopedics for options.  Chronic renal impairment, stage 3b (HCC)  -Avoid NSAIDs, currently on Voltaren gel only.  Monitor with labs, next visit.  Essential hypertension - Plan: olmesartan-hydrochlorothiazide (BENICAR HCT) 40-25 MG tablet, amLODipine (NORVASC) 5 MG tablet  -Stable with current regimen, labs next visit  Chronic bilateral low back pain without sciatica - Plan: DG Lumbar Spine 2-3 Views  -Handout on chronic low back pain.  Tylenol, episodic muscle relaxant for now, consider formal physical therapy for as well versus orthopedic eval depending on symptoms in 6 weeks.   Meds ordered this encounter  Medications  . methocarbamol (ROBAXIN) 500 MG tablet    Sig: Take one pill 3 times daily as needed for muscle relaxant    Dispense:  20 tablet    Refill:  0  . olmesartan-hydrochlorothiazide (BENICAR HCT) 40-25 MG tablet    Sig: Take 1 tablet by mouth daily.    Dispense:  90 tablet    Refill:  1  . amLODipine (NORVASC) 5 MG tablet    Sig: Take 1 tablet (5 mg total) by mouth daily.    Dispense:  90 tablet    Refill:  1  . diclofenac Sodium (VOLTAREN) 1 % GEL    Sig: Apply 2 g topically 4 (four) times daily.    Dispense:  100 g    Refill:  3   Patient Instructions   Okay to continue the topical treatment for knee pain, but depending on x-ray I would recommend meeting with orthopedist and can place that referral.  They can discuss various injections or other treatments for your knees.  See information below on chronic back pain, continue range of motion, stretches as possible.  Tylenol as needed.  I did refill muscle relaxant temporarily, use that medicine only if needed and be careful with sedation or dizziness.  Back pain  may also need to be  discussed with orthopedics.  Follow-up with me in 6 weeks and we can check lab work at that time.  No medication changes for now.   https://[email or web address]">  Chronic Back Pain When back pain lasts longer than 3 months, it is called chronic back pain. The cause of your back pain may not be known. Some common causes include:  Wear and tear (degenerative disease) of the bones, ligaments, or disks in your back.  Inflammation and stiffness in your back (arthritis). People who have chronic back pain often go through certain periods in which the pain is more intense (flare-ups). Many people can learn to manage the pain with home care. Follow these instructions at home: Pay attention to any changes in your symptoms. Take these actions to help with your pain: Managing pain and stiffness  If directed, apply ice to the painful area. Your health care provider may recommend applying ice during the first 24-48 hours after a flare-up begins. To do this: ? Put ice in a plastic bag. ? Place a towel between your skin and the bag. ? Leave the ice on for 20 minutes, 2-3 times per day.  If directed, apply heat to the affected area as often as told by your health care provider. Use the heat source that your health care provider recommends, such as a moist heat pack or a heating pad. ? Place a towel between your skin and the heat source. ? Leave the heat on for  20-30 minutes. ? Remove the heat if your skin turns bright red. This is especially important if you are unable to feel pain, heat, or cold. You may have a greater risk of getting burned.  Try soaking in a warm tub.      Activity  Avoid bending and other activities that make the problem worse.  Maintain a proper position when standing or sitting: ? When standing, keep your upper back and neck straight, with your shoulders pulled back. Avoid slouching. ? When sitting, keep your back straight and relax your shoulders. Do not round your  shoulders or pull them backward.  Do not sit or stand in one place for long periods of time.  Take brief periods of rest throughout the day. This will reduce your pain. Resting in a lying or standing position is usually better than sitting to rest.  When you are resting for longer periods, mix in some mild activity or stretching between periods of rest. This will help to prevent stiffness and pain.  Get regular exercise. Ask your health care provider what activities are safe for you.  Do not lift anything that is heavier than 10 lb (4.5 kg), or the limit that you are told, until your health care provider says that it is safe. Always use proper lifting technique, which includes: ? Bending your knees. ? Keeping the load close to your body. ? Avoiding twisting.  Sleep on a firm mattress in a comfortable position. Try lying on your side with your knees slightly bent. If you lie on your back, put a pillow under your knees.   Medicines  Treatment may include medicines for pain and inflammation taken by mouth or applied to the skin, prescription pain medicine, or muscle relaxants. Take over-the-counter and prescription medicines only as told by your health care provider.  Ask your health care provider if the medicine prescribed to you: ? Requires you to avoid driving or using machinery. ? Can cause constipation. You may need to take these actions to prevent or treat constipation:  Drink enough fluid to keep your urine pale yellow.  Take over-the-counter or prescription medicines.  Eat foods that are high in fiber, such as beans, whole grains, and fresh fruits and vegetables.  Limit foods that are high in fat and processed sugars, such as fried or sweet foods. General instructions  Do not use any products that contain nicotine or tobacco, such as cigarettes, e-cigarettes, and chewing tobacco. If you need help quitting, ask your health care provider.  Keep all follow-up visits as told by  your health care provider. This is important. Contact a health care provider if:  You have pain that is not relieved with rest or medicine.  Your pain gets worse, or you have new pain.  You have a high fever.  You have rapid weight loss.  You have trouble doing your normal activities. Get help right away if:  You have weakness or numbness in one or both of your legs or feet.  You have trouble controlling your bladder or your bowels.  You have severe back pain and have any of the following: ? Nausea or vomiting. ? Pain in your abdomen. ? Shortness of breath or you faint. Summary  Chronic back pain is back pain that lasts longer than 3 months.  When a flare-up begins, apply ice to the painful area for the first 24-48 hours.  Apply a moist heat pad or use a heating pad on the painful area as directed  by your health care provider.  When you are resting for longer periods, mix in some mild activity or stretching between periods of rest. This will help to prevent stiffness and pain. This information is not intended to replace advice given to you by your health care provider. Make sure you discuss any questions you have with your health care provider. Document Revised: 09/13/2019 Document Reviewed: 09/13/2019 Elsevier Patient Education  2021 [place].   Chronic Knee Pain, Adult Chronic knee pain is pain in one or both knees that lasts longer than 3 months. Symptoms of chronic knee pain may include swelling, stiffness, and discomfort. Age-related wear and tear (osteoarthritis) of the knee joint is the most common cause of chronic knee pain. Other possible causes include:  A long-term immune-related disease that causes inflammation of the knee (rheumatoid arthritis). This usually affects both knees.  Inflammatory arthritis, such as gout or pseudogout.  An injury to the knee that causes arthritis.  An injury to the knee that damages the ligaments. Ligaments are strong tissues  that connect bones to each other.  Runner's knee or pain behind the kneecap. Treatment for chronic knee pain depends on the cause. The main treatments for chronic knee pain are physical therapy and weight loss. This condition may also be treated with medicines, injections, a knee sleeve or brace, and by using crutches. Rest, ice, pressure (compression), and elevation, also known as RICE therapy, may also be recommended. Follow these instructions at home: If you have a knee sleeve or brace:  Wear the knee sleeve or brace as told by your health care provider. Remove it only as told by your health care provider.  Loosen it if your toes tingle, become numb, or turn cold and blue.  Keep it clean.  If the sleeve or brace is not waterproof: ? Do not let it get wet. ? Remove it if allowed by your health care provider, or cover it with a watertight covering when you take a bath or a shower.   Managing pain, stiffness, and swelling  If directed, apply heat to the affected area as often as told by your health care provider. Use the heat source that your health care provider recommends, such as a moist heat pack or a heating pad. ? If you have a removable knee sleeve or brace, remove it as told by your health care provider. ? Place a towel between your skin and the heat source. ? Leave the heat on for 20-30 minutes. ? Remove the heat if your skin turns bright red. This is especially important if you are unable to feel pain, heat, or cold. You may have a greater risk of getting burned.  If directed, put ice on the affected area. To do this: ? If you have a removable knee sleeve or brace, remove it as told by your health care provider. ? Put ice in a plastic bag. ? Place a towel between your skin and the bag. ? Leave the ice on for 20 minutes, 2-3 times a day. ? Remove the ice if your skin turns bright red. This is very important. If you cannot feel pain, heat, or cold, you have a greater risk of  damage to the area.  Move your toes often to reduce stiffness and swelling.  Raise (elevate) the injured area above the level of your heart while you are sitting or lying down.      Activity  Avoid high-impact activities or exercises, such as running, jumping rope, or  doing jumping jacks.  Follow the exercise plan that your health care provider designed for you. Your health care provider may suggest that you: ? Avoid activities that make knee pain worse. This may require you to change your exercise routines, sport participation, or job duties. ? Wear shoes with cushioned soles. ? Avoid sports that require running and sudden changes in direction. ? Do physical therapy. Physical therapy is planned to match your needs and abilities. It may include exercises for strength, flexibility, stability, and endurance. ? Do exercises that increase balance and strength, such as tai chi and yoga.  Do not use the injured limb to support your body weight until your health care provider says that you can. Use crutches as told by your health care provider.  Return to your normal activities as told by your health care provider. Ask your health care provider what activities are safe for you. General instructions  Take over-the-counter and prescription medicines only as told by your health care provider.  Lose weight if you are overweight. Losing even a little weight can reduce knee pain. Ask your health care provider what your ideal weight is, and how to safely lose extra weight. A dietitian may be able to help you plan your meals.  Do not use any products that contain nicotine or tobacco, such as cigarettes, e-cigarettes, and chewing tobacco. These can delay healing. If you need help quitting, ask your health care provider.  Keep all follow-up visits. This is important. Contact a health care provider if:  You have knee pain that is not getting better or gets worse.  You are unable to do your physical  therapy exercises due to knee pain. Get help right away if:  Your knee swells and the swelling becomes worse.  You cannot move your knee.  You have severe knee pain. Summary  Knee pain that lasts more than 3 months is considered chronic knee pain.  The main treatments for chronic knee pain are physical therapy and weight loss. You may also need to take medicines, wear a knee sleeve or brace, use crutches, and apply ice or heat.  Losing even a little weight can reduce knee pain. Ask your health care provider what your ideal weight is, and how to safely lose extra weight. A dietitian may be able to help you plan your meals.  Follow the exercise plan that your health care provider designed for you. This information is not intended to replace advice given to you by your health care provider. Make sure you discuss any questions you have with your health care provider. Document Revised: 01/17/2020 Document Reviewed: 01/17/2020 Elsevier Patient Education  2021 [employer].     If you have lab work done today you will be contacted with your lab results within the next 2 weeks.  If you have not heard from [place] then please contact us. The fastest way to get your results is to register for My Chart.   IF you received an x-ray today, you will receive an invoice from [facility]. Please contact [facility] at [phone] with questions or concerns regarding your invoice.   IF you received labwork today, you will receive an invoice from [facility]. Please contact [facility] at [phone] with questions or concerns regarding your invoice.   Our billing staff will not be able to assist you with questions regarding bills from these companies.  You will be contacted with the lab results as soon as they are available. The fastest way to get  your results is to activate your My Chart account. Instructions are located on the last page of this paperwork. If you have not heard from [place]  regarding the results in 2 weeks, please contact this office.          Signed, Merri Ray, MD [facility]

## 2020-08-28 NOTE — Patient Instructions (Addendum)
Okay to continue the topical treatment for knee pain, but depending on x-ray I would recommend meeting with orthopedist and can place that referral.  They can discuss various injections or other treatments for your knees.  See information below on chronic back pain, continue range of motion, stretches as possible.  Tylenol as needed.  I did refill muscle relaxant temporarily, use that medicine only if needed and be careful with sedation or dizziness.  Back pain  may also need to be discussed with orthopedics.  Follow-up with me in 6 weeks and we can check lab work at that time.  No medication changes for now.   https://[email or web address]">  Chronic Back Pain When back pain lasts longer than 3 months, it is called chronic back pain. The cause of your back pain may not be known. Some common causes include:  Wear and tear (degenerative disease) of the bones, ligaments, or disks in your back.  Inflammation and stiffness in your back (arthritis). People who have chronic back pain often go through certain periods in which the pain is more intense (flare-ups). Many people can learn to manage the pain with home care. Follow these instructions at home: Pay attention to any changes in your symptoms. Take these actions to help with your pain: Managing pain and stiffness  If directed, apply ice to the painful area. Your health care provider may recommend applying ice during the first 24-48 hours after a flare-up begins. To do this: ? Put ice in a plastic bag. ? Place a towel between your skin and the bag. ? Leave the ice on for 20 minutes, 2-3 times per day.  If directed, apply heat to the affected area as often as told by your health care provider. Use the heat source that your health care provider recommends, such as a moist heat pack or a heating pad. ? Place a towel between your skin and the heat source. ? Leave the heat on for 20-30 minutes. ? Remove the heat if your skin turns bright  red. This is especially important if you are unable to feel pain, heat, or cold. You may have a greater risk of getting burned.  Try soaking in a warm tub.      Activity  Avoid bending and other activities that make the problem worse.  Maintain a proper position when standing or sitting: ? When standing, keep your upper back and neck straight, with your shoulders pulled back. Avoid slouching. ? When sitting, keep your back straight and relax your shoulders. Do not round your shoulders or pull them backward.  Do not sit or stand in one place for long periods of time.  Take brief periods of rest throughout the day. This will reduce your pain. Resting in a lying or standing position is usually better than sitting to rest.  When you are resting for longer periods, mix in some mild activity or stretching between periods of rest. This will help to prevent stiffness and pain.  Get regular exercise. Ask your health care provider what activities are safe for you.  Do not lift anything that is heavier than 10 lb (4.5 kg), or the limit that you are told, until your health care provider says that it is safe. Always use proper lifting technique, which includes: ? Bending your knees. ? Keeping the load close to your body. ? Avoiding twisting.  Sleep on a firm mattress in a comfortable position. Try lying on your side with your knees slightly bent. If you lie  on your back, put a pillow under your knees.   Medicines  Treatment may include medicines for pain and inflammation taken by mouth or applied to the skin, prescription pain medicine, or muscle relaxants. Take over-the-counter and prescription medicines only as told by your health care provider.  Ask your health care provider if the medicine prescribed to you: ? Requires you to avoid driving or using machinery. ? Can cause constipation. You may need to take these actions to prevent or treat constipation:  Drink enough fluid to keep your urine  pale yellow.  Take over-the-counter or prescription medicines.  Eat foods that are high in fiber, such as beans, whole grains, and fresh fruits and vegetables.  Limit foods that are high in fat and processed sugars, such as fried or sweet foods. General instructions  Do not use any products that contain nicotine or tobacco, such as cigarettes, e-cigarettes, and chewing tobacco. If you need help quitting, ask your health care provider.  Keep all follow-up visits as told by your health care provider. This is important. Contact a health care provider if:  You have pain that is not relieved with rest or medicine.  Your pain gets worse, or you have new pain.  You have a high fever.  You have rapid weight loss.  You have trouble doing your normal activities. Get help right away if:  You have weakness or numbness in one or both of your legs or feet.  You have trouble controlling your bladder or your bowels.  You have severe back pain and have any of the following: ? Nausea or vomiting. ? Pain in your abdomen. ? Shortness of breath or you faint. Summary  Chronic back pain is back pain that lasts longer than 3 months.  When a flare-up begins, apply ice to the painful area for the first 24-48 hours.  Apply a moist heat pad or use a heating pad on the painful area as directed by your health care provider.  When you are resting for longer periods, mix in some mild activity or stretching between periods of rest. This will help to prevent stiffness and pain. This information is not intended to replace advice given to you by your health care provider. Make sure you discuss any questions you have with your health care provider. Document Revised: 09/13/2019 Document Reviewed: 09/13/2019 Elsevier Patient Education  2021 [place].   Chronic Knee Pain, Adult Chronic knee pain is pain in one or both knees that lasts longer than 3 months. Symptoms of chronic knee pain may include  swelling, stiffness, and discomfort. Age-related wear and tear (osteoarthritis) of the knee joint is the most common cause of chronic knee pain. Other possible causes include:  A long-term immune-related disease that causes inflammation of the knee (rheumatoid arthritis). This usually affects both knees.  Inflammatory arthritis, such as gout or pseudogout.  An injury to the knee that causes arthritis.  An injury to the knee that damages the ligaments. Ligaments are strong tissues that connect bones to each other.  Runner's knee or pain behind the kneecap. Treatment for chronic knee pain depends on the cause. The main treatments for chronic knee pain are physical therapy and weight loss. This condition may also be treated with medicines, injections, a knee sleeve or brace, and by using crutches. Rest, ice, pressure (compression), and elevation, also known as RICE therapy, may also be recommended. Follow these instructions at home: If you have a knee sleeve or brace:  Wear the knee  sleeve or brace as told by your health care provider. Remove it only as told by your health care provider.  Loosen it if your toes tingle, become numb, or turn cold and blue.  Keep it clean.  If the sleeve or brace is not waterproof: ? Do not let it get wet. ? Remove it if allowed by your health care provider, or cover it with a watertight covering when you take a bath or a shower.   Managing pain, stiffness, and swelling  If directed, apply heat to the affected area as often as told by your health care provider. Use the heat source that your health care provider recommends, such as a moist heat pack or a heating pad. ? If you have a removable knee sleeve or brace, remove it as told by your health care provider. ? Place a towel between your skin and the heat source. ? Leave the heat on for 20-30 minutes. ? Remove the heat if your skin turns bright red. This is especially important if you are unable to feel  pain, heat, or cold. You may have a greater risk of getting burned.  If directed, put ice on the affected area. To do this: ? If you have a removable knee sleeve or brace, remove it as told by your health care provider. ? Put ice in a plastic bag. ? Place a towel between your skin and the bag. ? Leave the ice on for 20 minutes, 2-3 times a day. ? Remove the ice if your skin turns bright red. This is very important. If you cannot feel pain, heat, or cold, you have a greater risk of damage to the area.  Move your toes often to reduce stiffness and swelling.  Raise (elevate) the injured area above the level of your heart while you are sitting or lying down.      Activity  Avoid high-impact activities or exercises, such as running, jumping rope, or doing jumping jacks.  Follow the exercise plan that your health care provider designed for you. Your health care provider may suggest that you: ? Avoid activities that make knee pain worse. This may require you to change your exercise routines, sport participation, or job duties. ? Wear shoes with cushioned soles. ? Avoid sports that require running and sudden changes in direction. ? Do physical therapy. Physical therapy is planned to match your needs and abilities. It may include exercises for strength, flexibility, stability, and endurance. ? Do exercises that increase balance and strength, such as tai chi and yoga.  Do not use the injured limb to support your body weight until your health care provider says that you can. Use crutches as told by your health care provider.  Return to your normal activities as told by your health care provider. Ask your health care provider what activities are safe for you. General instructions  Take over-the-counter and prescription medicines only as told by your health care provider.  Lose weight if you are overweight. Losing even a little weight can reduce knee pain. Ask your health care provider what your  ideal weight is, and how to safely lose extra weight. A dietitian may be able to help you plan your meals.  Do not use any products that contain nicotine or tobacco, such as cigarettes, e-cigarettes, and chewing tobacco. These can delay healing. If you need help quitting, ask your health care provider.  Keep all follow-up visits. This is important. Contact a health care provider if:  You have  knee pain that is not getting better or gets worse.  You are unable to do your physical therapy exercises due to knee pain. Get help right away if:  Your knee swells and the swelling becomes worse.  You cannot move your knee.  You have severe knee pain. Summary  Knee pain that lasts more than 3 months is considered chronic knee pain.  The main treatments for chronic knee pain are physical therapy and weight loss. You may also need to take medicines, wear a knee sleeve or brace, use crutches, and apply ice or heat.  Losing even a little weight can reduce knee pain. Ask your health care provider what your ideal weight is, and how to safely lose extra weight. A dietitian may be able to help you plan your meals.  Follow the exercise plan that your health care provider designed for you. This information is not intended to replace advice given to you by your health care provider. Make sure you discuss any questions you have with your health care provider. Document Revised: 01/17/2020 Document Reviewed: 01/17/2020 Elsevier Patient Education  2021 [employer].     If you have lab work done today you will be contacted with your lab results within the next 2 weeks.  If you have not heard from [place] then please contact us. The fastest way to get your results is to register for My Chart.   IF you received an x-ray today, you will receive an invoice from [facility]. Please contact [facility] at [phone] with questions or concerns regarding your invoice.   IF you received labwork  today, you will receive an invoice from [place]. Please contact [facility] at [phone] with questions or concerns regarding your invoice.   Our billing staff will not be able to assist you with questions regarding bills from these companies.  You will be contacted with the lab results as soon as they are available. The fastest way to get your results is to activate your My Chart account. Instructions are located on the last page of this paperwork. If you have not heard from [place] regarding the results in 2 weeks, please contact this office.

## 2020-09-06 ENCOUNTER — Other Ambulatory Visit: Payer: Medicare Other

## 2020-09-20 ENCOUNTER — Ambulatory Visit (INDEPENDENT_AMBULATORY_CARE_PROVIDER_SITE_OTHER): Payer: Medicare Other | Admitting: Family Medicine

## 2020-09-20 ENCOUNTER — Other Ambulatory Visit: Payer: Self-pay

## 2020-09-20 ENCOUNTER — Encounter: Payer: Self-pay | Admitting: Family Medicine

## 2020-09-20 VITALS — BP 136/70 | HR 110 | Temp 97.9°F | Ht 65.0 in | Wt 233.0 lb

## 2020-09-20 DIAGNOSIS — M545 Low back pain, unspecified: Secondary | ICD-10-CM

## 2020-09-20 DIAGNOSIS — R739 Hyperglycemia, unspecified: Secondary | ICD-10-CM | POA: Diagnosis not present

## 2020-09-20 DIAGNOSIS — I10 Essential (primary) hypertension: Secondary | ICD-10-CM

## 2020-09-20 DIAGNOSIS — M25562 Pain in left knee: Secondary | ICD-10-CM

## 2020-09-20 DIAGNOSIS — E785 Hyperlipidemia, unspecified: Secondary | ICD-10-CM

## 2020-09-20 DIAGNOSIS — M25561 Pain in right knee: Secondary | ICD-10-CM | POA: Diagnosis not present

## 2020-09-20 DIAGNOSIS — N1832 Chronic kidney disease, stage 3b: Secondary | ICD-10-CM

## 2020-09-20 DIAGNOSIS — G8929 Other chronic pain: Secondary | ICD-10-CM | POA: Diagnosis not present

## 2020-09-20 DIAGNOSIS — N2889 Other specified disorders of kidney and ureter: Secondary | ICD-10-CM

## 2020-09-20 DIAGNOSIS — M170 Bilateral primary osteoarthritis of knee: Secondary | ICD-10-CM

## 2020-09-20 NOTE — Patient Instructions (Addendum)
Please call orthopaedics to schedule appointment: Clance Boll Referral Coordinator [facility] [phone]  If continued back pain or worsens I recommend discussing with ortho or physical therapy.   No change in meds at this time.   Return to the clinic or go to the nearest emergency room if any of your symptoms worsen or new symptoms occur.        If you have lab work done today you will be contacted with your lab results within the next 2 weeks.  If you have not heard from [place] then please contact us. The fastest way to get your results is to register for My Chart.   IF you received an x-ray today, you will receive an invoice from [facility]. Please contact [facility] at [phone] with questions or concerns regarding your invoice.   IF you received labwork today, you will receive an invoice from [facility]. Please contact [facility] at [phone] with questions or concerns regarding your invoice.   Our billing staff will not be able to assist you with questions regarding bills from these companies.  You will be contacted with the lab results as soon as they are available. The fastest way to get your results is to activate your My Chart account. Instructions are located on the last page of this paperwork. If you have not heard from [place] regarding the results in 2 weeks, please contact this office.

## 2020-09-20 NOTE — Progress Notes (Signed)
Subjective:  Patient ID: Tammy Mccullough, female    DOB: 06-11-1941  Age: 80 y.o. MRN: [id number]  CC:  Chief Complaint  Patient presents with  . Transitions Of Care    Pt reports she feels well. PT would like provider to go over her recent x-rays on both of her knees from last OV with her. PT isn't currently fasting. Pt was informed today's visit is to transfer care. No acute issues reported.    HPI Tammy Mccullough presents for   Follow-up of knee pain as well as transfer of care.  Previous patient Dr. Nolon Rod.  I last saw her January 12.  Bilateral knee arthritis Longstanding bilateral knee pain with minimal change after physical therapy.  Continue Voltaren gel, but referred to orthopedics for treatment options.  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  Imaging performed January 12, right knee with severe degenerative changes medial compartment with moderate degenerative change of the lateral and patellofemoral compartments.  Left knee with moderate tricompartmental degenerative changes. She did not receive call from ortho- number provided today.  Knee pain same. Not debilitating. No assistive device needed.    Chronic low back pain Tylenol/episodic muscle relaxant Robaxin discussed at last visit.  Depending on persistence of symptoms recommended formal physical therapy.  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 Tylenol at night. Back is better at times. Better as moving in am.  No bowel or bladder incontinence, no saddle anesthesia, no lower extremity weakness or radiating pain.     Hypertension: With stage IIIb CKD Amlodipine 5 mg daily, Benicar HCT 40/25 mg daily.  Repeat labs planned today. Home readings: 130-140/80.  No new side effects.  Not fasting today.  BP Readings from Last 3 Encounters:  09/20/20 136/70   08/28/20 130/76  03/21/20 140/83   Lab Results  Component Value Date   CREATININE 1.33 (H) 03/21/2020    Glaucoma Treated by ophthalmology, Dr. Venetia Maxon. On eye gtts.   Obesity: Has tried exercise periodically. somewhat limited by her knees. Tai chi past 3 months once per week.  Rare fast food, soda daily - sprite - sugar free. Cooks at home usually.  Glucose 102, 120 last year.  Mild HLD - 210 total last year.  Wt Readings from Last 3 Encounters:  09/20/20 233 lb (105.7 kg)  08/28/20 233 lb (105.7 kg)  03/21/20 230 lb (104.3 kg)   Lab Results  Component Value Date   HGBA1C 5.4 10/25/2017    Body mass index is 38.77 kg/m.    History Patient Active Problem List   Diagnosis Date Noted  . Body mass index (BMI) of 40.0-44.9 in adult ([facility]) 10/25/2017  . 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 Glucose intolerance (impaired glucose tolerance) 04/21/2016  . Chronic renal insufficiency 09/09/2015  . Glaucoma 02/09/2013  . HTN (hypertension) 02/09/2013   Past Medical History:  Diagnosis Date  . Arthritis    B knees.  . Cataract   . Glaucoma   . Glucose intolerance (impaired glucose tolerance)   . Hypertension   . Renal insufficiency    Past Surgical History:  Procedure Laterality Date  . CATARACT EXTRACTION Right 07-2014  . CATARACT EXTRACTION Left 01/16/2016  . COLONOSCOPY     10 +yrs ago in [place], normal per pt  .  VAGINAL DELIVERY     x2   No Known Allergies Prior to Admission medications   Medication Sig Start Date End Date Taking? Authorizing Provider  acetaminophen (TYLENOL) 500 MG tablet Take 500 mg by mouth every 8 (eight) hours as needed.   Yes [provider]  amLODipine (NORVASC) 5 MG tablet Take 1 tablet (5 mg total) by mouth daily. 08/28/20  Yes Wendie Agreste, MD  aspirin 81 MG tablet Take 81 mg by mouth daily. Reported on 02/15/2016   Yes [provider]  AZOPT 1 % ophthalmic suspension  10/03/16  Yes [provider]  diclofenac Sodium (VOLTAREN) 1 % GEL Apply 2 g topically 4 (four) times daily. 08/28/20  Yes Wendie Agreste, MD  methocarbamol (ROBAXIN) 500 MG tablet Take one pill 3 times daily as needed for muscle relaxant 08/28/20  Yes Wendie Agreste, MD  olmesartan-hydrochlorothiazide (BENICAR HCT) 40-25 MG tablet Take 1 tablet by mouth daily. 08/28/20  Yes Wendie Agreste, MD  ZIOPTAN 0.0015 % SOLN  09/26/16  Yes [provider]   Social History   Socioeconomic History  . Marital status: Widowed    Spouse name: Not on file  . Number of children: 2  . Years of education: Not on file  . Highest education level: Not on file  Occupational History  . Occupation: retired  Tobacco Use  . Smoking status: Never Smoker  . Smokeless tobacco: Never Used  Substance and Sexual Activity  . Alcohol use: Yes    Alcohol/week: 6.0 standard drinks    Types: 6 Standard drinks or equivalent per week    Comment: 2-3  . Drug use: No  . Sexual activity: Not Currently  Other Topics Concern  . Not on file  Social History Narrative   Marital status: widowed since 2010 after 58 years of marriage.  Not dating but would like to be.      Children:  2 Children; no grandchildren; 2 adopted grandchildren.  One son is disabled; patient supports; son is obese.      Living: alone with 1 cat.      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      Tobacco: never      Alcohol: weekends liquor; some during the week.  3-5 cups per week.      Drugs: none      Exercise: water aerobics three days per week.  [employer].      Seatbelt: 100%;      Guns: none      ADLs:: independent with ADLs.   Drives. No assistant devices.      Advanced Directives: yes; healthcare POA:  Older brother [name]);  FULL CODE but no prolonged measures.     Social Determinants of Health    Financial Resource Strain: Not on file  Food Insecurity: Not on file  Transportation Needs: Not on file  Physical Activity: Not on file  Stress: Not on file  Social Connections: Not on file  Intimate Partner Violence: Not on file    Review of Systems  Constitutional: Negative for fatigue and unexpected weight change.  Respiratory: Negative for chest tightness and shortness of breath.   Cardiovascular: Negative for chest pain, palpitations and leg swelling.  Gastrointestinal: Negative for abdominal pain and blood in stool.  Neurological: Negative for dizziness, syncope, light-headedness and headaches.     Objective:   Vitals:   09/20/20 1594 09/20/20 5859  BP: (!) 143/73 136/70  Pulse: (!) 110   Temp: 97.9 F (36.6 C)   TempSrc: Temporal   SpO2: 99%   Weight: 233 lb (105.7 kg)   Height: 5' 5" (1.651 m)      Physical Exam Vitals reviewed.  Constitutional:      Appearance: She is well-developed and well-nourished.     Comments: Overweight/obese.   HENT:     Head: Normocephalic and atraumatic.  Eyes:     Extraocular Movements: EOM normal.     Conjunctiva/sclera: Conjunctivae normal.     Pupils: Pupils are equal, round, and reactive to light.  Neck:     Vascular: No carotid bruit.  Cardiovascular:     Rate and Rhythm: Normal rate and regular rhythm.     Pulses: Intact distal pulses.     Heart sounds: Normal heart sounds.  Pulmonary:     Effort: Pulmonary effort is normal.     Breath sounds: Normal breath sounds.  Abdominal:     Palpations: Abdomen is soft. There is no pulsatile mass.     Tenderness: There is no abdominal tenderness.  Skin:    General: Skin is warm and dry.  Neurological:     Mental Status: She is alert and oriented to person, place, and time.  Psychiatric:        Mood and Affect: Mood and affect normal.        Behavior: Behavior normal.    Assessment & Plan:  Shree Ramseur Urschel is a 80 y.o. female . Essential hypertension - Plan:  Lipid panel, Comprehensive metabolic panel  -  Stable, tolerating current regimen. Medications refilled last visit. Labs pending as above.   Hyperglycemia - Plan: Hemoglobin A1c  Chronic renal impairment, stage 3b (HCC)  - check CMP, avoid oral nsaids.   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  Severe arthritis on the right, moderate left.  Phone number provided for orthopedics that she did not receive contact, appears I did try to call her multiple times.  No change in meds for now.  -Overall back is improved.  Option of Robaxin, PT, discussion with Ortho.  RTC precautions  Hyperlipidemia, unspecified hyperlipidemia type - Plan: Lipid panel  -Mild elevation with total cholesterol previously, recheck lipid panel.  May need repeat fasting if elevated.   No orders of the defined types were placed in this encounter.  Patient Instructions   Please call orthopaedics to schedule appointment: Clance Boll Referral Coordinator [facility] [phone]  If continued back pain or worsens I recommend discussing with ortho or physical therapy.   No change in meds at this time.   Return to the clinic or go to the nearest emergency room if any of your symptoms worsen or new symptoms occur.        If you have lab work done today you will be contacted with your lab results within the next 2 weeks.  If you have not heard from [place] then please contact us. The fastest way to get your results is to register for My Chart.   IF you received an x-ray today, you will receive an invoice from [facility]. Please contact [facility] Radiology at [phone] with questions or concerns regarding your invoice.   IF you received labwork today, you will receive an invoice from [place] [facility]. Please contact [facility] at [phone] with questions or concerns regarding your  invoice.   Our  billing staff will not be able to assist you with questions regarding bills from these companies.  You will be contacted with the lab results as soon as they are available. The fastest way to get your results is to activate your My Chart account. Instructions are located on the last page of this paperwork. If you have not heard from [place] regarding the results in 2 weeks, please contact this office.         Signed, Merri Ray, MD [facility]

## 2020-09-21 LAB — COMPREHENSIVE METABOLIC PANEL
ALT: 9 IU/L (ref 0–32)
AST: 19 IU/L (ref 0–40)
Albumin/Globulin Ratio: 1.7 (ref 1.2–2.2)
Albumin: 4.2 g/dL (ref 3.6–4.6)
Alkaline Phosphatase: 54 IU/L (ref 44–121)
BUN/Creatinine Ratio: 25 (ref 12–28)
BUN: 33 mg/dL — ABNORMAL HIGH (ref 8–27)
Bilirubin Total: 0.7 mg/dL (ref 0.0–1.2)
CO2: 22 mmol/L (ref 20–29)
Calcium: 9.6 mg/dL (ref 8.7–10.3)
Chloride: 105 mmol/L (ref 96–106)
Creatinine, Ser: 1.33 mg/dL — ABNORMAL HIGH (ref 0.57–1.00)
GFR calc Af Amer: 43 mL/min/{1.73_m2} — ABNORMAL LOW (ref >59)
GFR calc non Af Amer: 38 mL/min/{1.73_m2} — ABNORMAL LOW (ref >59)
Globulin, Total: 2.5 g/dL (ref 1.5–4.5)
Glucose: 130 mg/dL — ABNORMAL HIGH (ref 65–99)
Potassium: 3.6 mmol/L (ref 3.5–5.2)
Sodium: 142 mmol/L (ref 134–144)
Total Protein: 6.7 g/dL (ref 6.0–8.5)

## 2020-09-21 LAB — LIPID PANEL
Chol/HDL Ratio: 2.2 ratio (ref 0.0–4.4)
Cholesterol, Total: 210 mg/dL — ABNORMAL HIGH (ref 100–199)
HDL: 95 mg/dL (ref >39)
LDL Chol Calc (NIH): 99 mg/dL (ref 0–99)
Triglycerides: 95 mg/dL (ref 0–149)
VLDL Cholesterol Cal: 16 mg/dL (ref 5–40)

## 2020-09-21 LAB — HEMOGLOBIN A1C
Est. average glucose Bld gHb Est-mCnc: 105 mg/dL
Hgb A1c MFr Bld: 5.3 % (ref 4.8–5.6)

## 2020-09-23 ENCOUNTER — Ambulatory Visit (INDEPENDENT_AMBULATORY_CARE_PROVIDER_SITE_OTHER): Payer: Medicare Other | Admitting: *Deleted

## 2020-09-23 ENCOUNTER — Other Ambulatory Visit: Payer: Self-pay

## 2020-09-23 DIAGNOSIS — B351 Tinea unguium: Secondary | ICD-10-CM

## 2020-09-23 NOTE — Progress Notes (Signed)
Patient presents today for the 5th laser treatment. Diagnosed with mycotic nail infection by Dr. Cannon Kettle.   Toenail most affected are the hallux nails bilateral. The nails are clearing. There is darkened vertical line still in the middle of the right hallux.  The nails are improving.  All other systems are negative.  Nails were filed thin. Laser therapy was administered to 1st toenails bilateral and patient tolerated the treatment well. All safety precautions were in place.    Follow up in 8 weeks for laser #6.   ~Take final nail pic next visit~

## 2020-09-25 ENCOUNTER — Ambulatory Visit (INDEPENDENT_AMBULATORY_CARE_PROVIDER_SITE_OTHER): Payer: Medicare Other | Admitting: Physician Assistant

## 2020-09-25 ENCOUNTER — Encounter: Payer: Self-pay | Admitting: Physician Assistant

## 2020-09-25 DIAGNOSIS — M1711 Unilateral primary osteoarthritis, right knee: Secondary | ICD-10-CM | POA: Diagnosis not present

## 2020-09-25 DIAGNOSIS — M1712 Unilateral primary osteoarthritis, left knee: Secondary | ICD-10-CM

## 2020-09-25 DIAGNOSIS — M545 Low back pain, unspecified: Secondary | ICD-10-CM | POA: Diagnosis not present

## 2020-09-25 MED ORDER — METHYLPREDNISOLONE ACETATE 40 MG/ML IJ SUSP
40.0000 mg | INTRAMUSCULAR | Status: AC | PRN
  Administered 2020-09-25: 40 mg via INTRA_ARTICULAR

## 2020-09-25 MED ORDER — LIDOCAINE HCL 1 % IJ SOLN
0.5000 mL | INTRAMUSCULAR | Status: AC | PRN
  Administered 2020-09-25: .5 mL

## 2020-09-25 MED ORDER — LIDOCAINE HCL 1 % IJ SOLN
0.5000 mL | INTRAMUSCULAR | Status: AC | PRN
Start: 2020-09-25 — End: 2020-09-25
  Administered 2020-09-25: .5 mL

## 2020-09-25 NOTE — Progress Notes (Signed)
Office Visit Note   Patient: Tammy Mccullough           Date of Birth: 1941-07-31           MRN: [id number] Visit Date: 09/25/2020              Requested by: Wendie Agreste, MD [address] [place],  [place] [address] PCP: Wendie Agreste, MD   Assessment & Plan: Visit Diagnoses:  1. Unilateral primary osteoarthritis, left knee   2. Primary osteoarthritis of right knee   3. Low back pain without sciatica, unspecified back pain laterality, unspecified chronicity     Plan: Due to the fact the patient has had no conservative treatment of either knee or back outside of physical therapy for her knees recommend cortisone injections both knees.  We will also send her back to therapy for quad strengthening.  Discussed weight loss with her.  Discussed knee friendly exercises.  We will also include back exercises core strengthening modalities and physical therapy.  She was given a prescription for regular physical therapy today.  She will follow-up with [place] in 4 weeks.  Questions encouraged and answered at length.  Follow-Up Instructions: No follow-ups on file.   Orders:  Orders Placed This Encounter  Procedures  . Large Joint Inj   No orders of the defined types were placed in this encounter.     Procedures: Large Joint Inj: bilateral knee on 09/25/2020 2:43 PM Indications: pain Details: 22 G 1.5 in needle, anterolateral approach  Arthrogram: No  Medications (Right): 0.5 mL lidocaine 1 %; 40 mg methylPREDNISolone acetate 40 MG/ML Medications (Left): 0.5 mL lidocaine 1 %; 40 mg methylPREDNISolone acetate 40 MG/ML Outcome: tolerated well, no immediate complications Procedure, treatment alternatives, risks and benefits explained, specific risks discussed. Consent was given by the patient. Immediately prior to procedure a time out was called to verify the correct patient, procedure, equipment, support staff and site/side marked as required. Patient was prepped and draped in the  usual sterile fashion.       Clinical Data: No additional findings.   Subjective: Chief Complaint  Patient presents with  . Left Knee - Pain  . Right Knee - Pain    HPI [name] is a pleasant 80 year old female were seen for first time for bilateral knee pain and low back pain.  She states she has pain in front of both knees.  No mechanical symptoms of either knee.  Pain has been on and off both knees.  Less than a year.  States that the pain began in her knees after being unable to do water aerobics due to Covid.  She denies any injury to either knee.  She is also having some low back pain without any numbness tingling or pain down either leg.  Pain does not awaken her.  Denies any bowel bladder dysfunction or saddle anesthesia like symptoms.  No known injury to her back.  She does feel that therapy for her knees in the past helped with the knee pain.  She takes aspirin using Voltaren gel on her knees which does help some with the knee pain.  States her knee pain is not severe she just has difficulty getting up and down from seated position.  She uses no assistive device. She is nondiabetic. Radiographs dated 08/28/2020 were reviewed.  Multiple views of the right knee showed no acute fracture.  Tricompartmental arthritis with bone-on-bone medial compartment.  Moderate lateral and severe patellar femoral degenerative changes.  No acute fractures. Left knee multiple views: Tricompartmental arthritis with bone-on-bone medial compartment.  Moderate lateral compartmental changes and severe patellofemoral changes.  No acute fractures. Lumbar spine 2 views: Multiple level degenerative changes.  Grade 1 retrolisthesis 2013. no acute fractures.  Review of Systems Negative for fever or chills.  Objective: Vital Signs: There were no vitals taken for this visit.  Physical Exam Constitutional:      Appearance: She is not ill-appearing or diaphoretic.  Pulmonary:     Effort: Pulmonary effort is  normal.  Neurological:     Mental Status: She is alert and oriented to person, place, and time.  Psychiatric:        Mood and Affect: Mood normal.     Ortho Exam Lower extremities: 5 out of 5 strength throughout lower extremities against resistance negative straight leg raise bilaterally. Bilateral knees: Good range of motion both knees no abnormal warmth erythema or effusion.  No ecchymosis of either knee.  Tenderness along the medial joint line left knee and lateral joint line of the right knee.  No instability valgus varus stressing of either knee.  Passive range of motion reveals patellofemoral crepitus bilaterally. Specialty Comments:  No specialty comments available.  Imaging: No results found.   PMFS History: Patient Active Problem List   Diagnosis Date Noted  . Body mass index (BMI) of 40.0-44.9 in adult ([place]) 10/25/2017  . 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 Glucose intolerance (impaired glucose tolerance) 04/21/2016  . Chronic renal insufficiency 09/09/2015  . Glaucoma 02/09/2013  . HTN (hypertension) 02/09/2013   Past Medical History:  Diagnosis Date  . Arthritis    B knees.  . Cataract   . Glaucoma   . Glucose intolerance (impaired glucose tolerance)   . Hypertension   . Renal insufficiency     Family History  Problem Relation Age of Onset  . Diabetes Mother   . Heart disease Mother        unsure  . COPD Father   . Diabetes Sister   . Hypertension Sister   . Kidney disease Sister   . Diabetes Brother   . Diabetes Brother   . Colon cancer Neg Hx   . Rectal cancer Neg Hx   . Stomach cancer Neg Hx     Past Surgical History:  Procedure Laterality Date  . CATARACT EXTRACTION Right 07-2014  . CATARACT EXTRACTION Left 01/16/2016  . COLONOSCOPY     10 +yrs ago in [place], normal per pt  . VAGINAL DELIVERY     x2   Social History   Occupational History  .  Occupation: retired  Tobacco Use  . Smoking status: Never Smoker  . Smokeless tobacco: Never Used  Substance and Sexual Activity  . Alcohol use: Yes    Alcohol/week: 6.0 standard drinks    Types: 6 Standard drinks or equivalent per week    Comment: 2-3  . Drug use: No  . Sexual activity: Not Currently

## 2020-09-27 DIAGNOSIS — M25562 Pain in left knee: Secondary | ICD-10-CM | POA: Diagnosis not present

## 2020-09-27 DIAGNOSIS — M25561 Pain in right knee: Secondary | ICD-10-CM | POA: Diagnosis not present

## 2020-09-27 DIAGNOSIS — M5459 Other low back pain: Secondary | ICD-10-CM | POA: Diagnosis not present

## 2020-09-30 DIAGNOSIS — M25562 Pain in left knee: Secondary | ICD-10-CM | POA: Diagnosis not present

## 2020-09-30 DIAGNOSIS — M25561 Pain in right knee: Secondary | ICD-10-CM | POA: Diagnosis not present

## 2020-09-30 DIAGNOSIS — M5459 Other low back pain: Secondary | ICD-10-CM | POA: Diagnosis not present

## 2020-10-02 DIAGNOSIS — M545 Low back pain, unspecified: Secondary | ICD-10-CM | POA: Diagnosis not present

## 2020-10-02 DIAGNOSIS — M25561 Pain in right knee: Secondary | ICD-10-CM | POA: Diagnosis not present

## 2020-10-02 DIAGNOSIS — M5459 Other low back pain: Secondary | ICD-10-CM | POA: Diagnosis not present

## 2020-10-02 DIAGNOSIS — M25562 Pain in left knee: Secondary | ICD-10-CM | POA: Diagnosis not present

## 2020-10-07 DIAGNOSIS — M25561 Pain in right knee: Secondary | ICD-10-CM | POA: Diagnosis not present

## 2020-10-07 DIAGNOSIS — M25562 Pain in left knee: Secondary | ICD-10-CM | POA: Diagnosis not present

## 2020-10-07 DIAGNOSIS — M5459 Other low back pain: Secondary | ICD-10-CM | POA: Diagnosis not present

## 2020-10-09 DIAGNOSIS — M5459 Other low back pain: Secondary | ICD-10-CM | POA: Diagnosis not present

## 2020-10-09 DIAGNOSIS — M25561 Pain in right knee: Secondary | ICD-10-CM | POA: Diagnosis not present

## 2020-10-09 DIAGNOSIS — M25562 Pain in left knee: Secondary | ICD-10-CM | POA: Diagnosis not present

## 2020-10-09 DIAGNOSIS — M545 Low back pain, unspecified: Secondary | ICD-10-CM | POA: Diagnosis not present

## 2020-10-14 DIAGNOSIS — M25562 Pain in left knee: Secondary | ICD-10-CM | POA: Diagnosis not present

## 2020-10-14 DIAGNOSIS — M545 Low back pain, unspecified: Secondary | ICD-10-CM | POA: Diagnosis not present

## 2020-10-14 DIAGNOSIS — M25561 Pain in right knee: Secondary | ICD-10-CM | POA: Diagnosis not present

## 2020-10-14 DIAGNOSIS — M5459 Other low back pain: Secondary | ICD-10-CM | POA: Diagnosis not present

## 2020-10-15 DIAGNOSIS — H04123 Dry eye syndrome of bilateral lacrimal glands: Secondary | ICD-10-CM | POA: Diagnosis not present

## 2020-10-15 DIAGNOSIS — H401332 Pigmentary glaucoma, bilateral, moderate stage: Secondary | ICD-10-CM | POA: Diagnosis not present

## 2020-10-16 DIAGNOSIS — M25562 Pain in left knee: Secondary | ICD-10-CM | POA: Diagnosis not present

## 2020-10-16 DIAGNOSIS — M5459 Other low back pain: Secondary | ICD-10-CM | POA: Diagnosis not present

## 2020-10-16 DIAGNOSIS — M25561 Pain in right knee: Secondary | ICD-10-CM | POA: Diagnosis not present

## 2020-10-16 DIAGNOSIS — M545 Low back pain, unspecified: Secondary | ICD-10-CM | POA: Diagnosis not present

## 2020-10-17 ENCOUNTER — Ambulatory Visit: Payer: Self-pay | Admitting: *Deleted

## 2020-10-17 NOTE — Telephone Encounter (Addendum)
Pt called in c/o having dizziness when she bends over or makes sudden moves.   It started Tues. Morning when she reached down to get her bedroom shoes upon waking up.   Once she was back up from bending over the dizziness resolves.   This happens when she bends over or moves suddenly.   Not dizzy when she is upright and walking but if she moves suddenly the dizziness occurs. No new medications.   Not been sick.  No N/V/D.   Drinking approx. 2 16 oz bottles of water a day which is her usual.   No shortness of breath or chest pain.   No history of anemia.  No underlying cardiac or lung issues.   She has never had a problem with dizziness.  I sent a high priority note to Primary Care at [facility] so they could contact her for an appt.   (Due to a computer issue on my end I wasn't able to warm transfer the call into their office). She can be reached at [phone].  Per protocol for dizziness she needs to be seen within 24 hours.  I called into Primary Care at [facility] and made sure they received my message to call her regarding an appt and they did receive it.    Reason for Disposition . [1] MODERATE dizziness (e.g., interferes with normal activities) AND [2] has NOT been evaluated by physician for this  (Exception: dizziness caused by heat exposure, sudden standing, or poor fluid intake)  Answer Assessment - Initial Assessment Questions 1. DESCRIPTION: "Describe your dizziness."     Tues. Morning I got up.   I reached down to get my shoes and when I reached up I was dizzy.    I was dizzy later that morning.   Today I can't move fast or bend down without getting dizzy.  BP 116/69 this morning.  Tues. When dizzy 124/68, yesterday 125/67.    Why am I dizzy? 2. LIGHTHEADED: "Do you feel lightheaded?" (e.g., somewhat faint, woozy, weak upon standing)     Just dizzy.   Once standing up and move slowly I'm fine.    Monday I went to therapy I was fine.   Tuesday morning I went on to the eye dr without a problem  and drove there myself. 3. VERTIGO: "Do you feel like either you or the room is spinning or tilting?" (i.e. vertigo)     No spinning of room   Only happens if I reach down or move suddenly. 4. SEVERITY: "How bad is it?"  "Do you feel like you are going to faint?" "Can you stand and walk?"   - MILD: Feels slightly dizzy, but walking normally.   - MODERATE: Feels very unsteady when walking, but not falling; interferes with normal activities (e.g., school, work) .   - SEVERE: Unable to walk without falling, or requires assistance to walk without falling; feels like passing out now.      Mild  No problems walking  5. ONSET:  "When did the dizziness begin?"     Tuesday morning when I reached down to get my bedroom shoes.  Once I'm up I'm not dizzy. 6. AGGRAVATING FACTORS: "Does anything make it worse?" (e.g., standing, change in head position)     Bending down and sudden movements.   7. HEART RATE: "Can you tell me your heart rate?" "How many beats in 15 seconds?"  (Note: not all patients can do this)       Not  asked No heart history. 8. CAUSE: "What do you think is causing the dizziness?"     I have no idea.   9. RECURRENT SYMPTOM: "Have you had dizziness before?" If Yes, ask: "When was the last time?" "What happened that time?"     No   10. OTHER SYMPTOMS: "Do you have any other symptoms?" (e.g., fever, chest pain, vomiting, diarrhea, bleeding)       None of the above  11. PREGNANCY: "Is there any chance you are pregnant?" "When was your last menstrual period?"       N/A due to age  Protocols used: DIZZINESS [facility]

## 2020-10-18 ENCOUNTER — Encounter: Payer: Self-pay | Admitting: Family Medicine

## 2020-10-18 ENCOUNTER — Other Ambulatory Visit: Payer: Self-pay

## 2020-10-18 ENCOUNTER — Ambulatory Visit (INDEPENDENT_AMBULATORY_CARE_PROVIDER_SITE_OTHER): Payer: Medicare Other | Admitting: Family Medicine

## 2020-10-18 VITALS — BP 147/77 | HR 95 | Temp 98.0°F | Ht 65.0 in | Wt 228.0 lb

## 2020-10-18 DIAGNOSIS — J309 Allergic rhinitis, unspecified: Secondary | ICD-10-CM | POA: Diagnosis not present

## 2020-10-18 DIAGNOSIS — R42 Dizziness and giddiness: Secondary | ICD-10-CM | POA: Diagnosis not present

## 2020-10-18 MED ORDER — FLUTICASONE PROPIONATE 50 MCG/ACT NA SUSP: 1.0000 | Freq: Every day | NASAL | 3 refills | Status: DC

## 2020-10-18 NOTE — Progress Notes (Signed)
Subjective:  Patient ID: Tammy Mccullough, female    DOB: [birth year]  Age: 80 y.o. MRN: [id number]  CC:  Chief Complaint  Patient presents with  . Dizziness    This started tue morning and feel better today. Swimmy headed when bending down to pick something up     HPI Tammy Mccullough presents for  Dizziness Started 3 days ago.  Picking something off floor- felt dizzy - off balance with bending only, room not spinning. Went to eye doctor. No syncope. Some runny nose. No CP/palpitations.  No weakness, no new HA.  Improved yesterday and today. Able to ben over and stand today - no recurrence. Still some nasal congestion, fullness in sinuses. No sneezing. No cough, no fever, feels ok otherwise. Allergies years ago. Some runny nose in spring at times. Nose clogged recently. Runny eyes recently.  Tx: none.[email or web address]   History of hypertension on amlodipine, olmesartan HCT, renal insufficiency -stage IIIb CKD. No missed doses. Took meds before visit today.  Eating and drinking ok.  Home BP 125/67, 116/69.   BP Readings from Last 3 Encounters:  10/18/20 (!) 147/77  09/20/20 136/70  08/28/20 130/76   Lab Results  Component Value Date   CREATININE 1.33 (H) 09/20/2020     Orthostatic VS for the past 24 hrs (Last 3 readings):  BP- Lying Pulse- Lying BP- Standing at 0 minutes Pulse- Standing at 0 minutes BP- Standing at 3 minutes Pulse- Standing at 3 minutes  10/18/20 0839 132/80 91 155/83 99 150/84 96      History Patient Active Problem List   Diagnosis Date Noted  . Body mass index (BMI) of 40.0-44.9 in adult ([place]) 10/25/2017  . 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 Glucose intolerance (impaired glucose tolerance) 04/21/2016  . Chronic renal insufficiency 09/09/2015  . Glaucoma 02/09/2013  . HTN (hypertension) 02/09/2013   Past Medical History:  Diagnosis Date  . Arthritis    B knees.   . Cataract   . Glaucoma   . Glucose intolerance (impaired glucose tolerance)   . Hypertension   . Renal insufficiency    Past Surgical History:  Procedure Laterality Date  . CATARACT EXTRACTION Right 07-2014  . CATARACT EXTRACTION Left 01/16/2016  . COLONOSCOPY     10 +yrs ago in [place], normal per pt  . VAGINAL DELIVERY     x2   No Known Allergies Prior to Admission medications   Medication Sig Start Date End Date Taking? Authorizing Provider  acetaminophen (TYLENOL) 500 MG tablet Take 500 mg by mouth every 8 (eight) hours as needed.   Yes [provider]  amLODipine (NORVASC) 5 MG tablet Take 1 tablet (5 mg total) by mouth daily. 08/28/20  Yes Wendie Agreste, MD  aspirin 81 MG tablet Take 81 mg by mouth daily. Reported on 02/15/2016   Yes [provider]  AZOPT 1 % ophthalmic suspension  10/03/16  Yes [provider]  diclofenac Sodium (VOLTAREN) 1 % GEL Apply 2 g topically 4 (four) times daily. 08/28/20  Yes Wendie Agreste, MD  olmesartan-hydrochlorothiazide (BENICAR HCT) 40-25 MG tablet Take 1 tablet by mouth daily. 08/28/20  Yes Wendie Agreste, MD  ZIOPTAN 0.0015 % SOLN  09/26/16  Yes [provider]   Social History   Socioeconomic History  . Marital status: Widowed    Spouse name: Not on file  . Number  of children: 2  . Years of education: Not on file  . Highest education level: Not on file  Occupational History  . Occupation: retired  Tobacco Use  . Smoking status: Never Smoker  . Smokeless tobacco: Never Used  Substance and Sexual Activity  . Alcohol use: Yes    Alcohol/week: 6.0 standard drinks    Types: 6 Standard drinks or equivalent per week    Comment: 2-3  . Drug use: No  . Sexual activity: Not Currently  Other Topics Concern  . Not on file  Social History Narrative   Marital status: widowed since 2010 after 63 years of marriage.  Not dating but would like to be.      Children:  2 Children; no grandchildren; 2  adopted grandchildren.  One son is disabled; patient supports; son is obese.      Living: alone with 1 cat.      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      Tobacco: never      Alcohol: weekends liquor; some during the week.  3-5 cups per week.      Drugs: none      Exercise: water aerobics three days per week.  [employer].      Seatbelt: 100%;      Guns: none      ADLs:: independent with ADLs.   Drives. No assistant devices.      Advanced Directives: yes; healthcare POA:  Older brother [name]);  FULL CODE but no prolonged measures.     Social Determinants of Health   Financial Resource Strain: Not on file  Food Insecurity: Not on file  Transportation Needs: Not on file  Physical Activity: Not on file  Stress: Not on file  Social Connections: Not on file  Intimate Partner Violence: Not on file    Review of Systems   Objective:   Vitals:   10/18/20 0833  BP: (!) 147/77  Pulse: 95  Temp: 98 F (36.7 C)  TempSrc: Temporal  SpO2: 99%  Weight: 228 lb (103.4 kg)  Height: 5\' 5"  (1.651 m)     Physical Exam Vitals reviewed.  Constitutional:      Appearance: She is well-developed and well-nourished.  HENT:     Head: Normocephalic and atraumatic.     Nose: Congestion (left greater than right turbinate edema. ) present.     Comments: Sinuses nontender.  Eyes:     Extraocular Movements: EOM normal.     Right eye: Normal extraocular motion and no nystagmus.     Left eye: Normal extraocular motion and no nystagmus.     Pupils: Pupils are equal, round, and reactive to light.     Comments: Scleral injection bilaterally.   Neck:     Vascular: No carotid bruit.  Cardiovascular:     Rate and Rhythm: Normal rate and regular rhythm.     Pulses: Intact distal pulses.     Heart sounds: Normal heart sounds.  Pulmonary:     Effort: Pulmonary effort is normal.     Breath sounds: Normal breath sounds.   Abdominal:     Palpations: Abdomen is soft. There is no pulsatile mass.     Tenderness: There is no abdominal tenderness.  Skin:    General: Skin is warm and dry.  Neurological:     General: No focal deficit present.     Mental Status: She is alert and oriented to person, place,  and time.     GCS: GCS eye subscore is 4. GCS verbal subscore is 5. GCS motor subscore is 6.     Cranial Nerves: No cranial nerve deficit, dysarthria or facial asymmetry.     Sensory: Sensation is intact.     Motor: Motor function is intact. No weakness or pronator drift.     Coordination: Coordination is intact. Romberg sign negative. Coordination normal. Finger-Nose-Finger Test normal.     Gait: Gait is intact.  Psychiatric:        Mood and Affect: Mood and affect normal.        Behavior: Behavior normal.        Assessment & Plan:  Tammy Mccullough is a 80 y.o. female . Dizziness - Plan: fluticasone (FLONASE) 50 MCG/ACT nasal spray  Allergic rhinitis, unspecified seasonality, unspecified trigger - Plan: fluticasone (FLONASE) 50 MCG/ACT nasal spray  Dizziness with change in position a few days ago, has since improved.  Has had some congestion as above, suspected allergic rhinitis/sinus congestion as contributor.  Nonfocal exam.  No cardiac symptoms.  Blood pressure borderline elevated but just took medications today.  Not orthostatic.  Reassuring exam and history, and symptoms have improved  -Trial of Flonase nasal spray, maintain hydration, RTC precautions if any worsening of symptoms, or not continuing to improve, ER precautions, handout given.  Meds ordered this encounter  Medications  . fluticasone (FLONASE) 50 MCG/ACT nasal spray    Sig: Place 1 spray into both nostrils daily.    Dispense:  16 g    Refill:  3   Patient Instructions   Dizziness could be related to allergies or congestion in the sinuses.  Try fluticasone nasal spray 1 spray in each nostril once per day for allergies.  Make sure  to stay well-hydrated.  If dizziness does not continue to improve this week and or any worsening symptoms return for other testing. Return to the clinic or go to the nearest emergency room if any of your symptoms worsen or new symptoms occur.  If you have lab work done today you will be contacted with your lab results within the next 2 weeks.  If you have not heard from [place] then please contact us. The fastest way to get your results is to register for My Chart.   IF you received an x-ray today, you will receive an invoice from [facility]. Please contact [facility] at [phone] with questions or concerns regarding your invoice.   IF you received labwork today, you will receive an invoice from [facility]. Please contact [facility] at [phone] with questions or concerns regarding your invoice.   Our billing staff will not be able to assist you with questions regarding bills from these companies.  You will be contacted with the lab results as soon as they are available. The fastest way to get your results is to activate your My Chart account. Instructions are located on the last page of this paperwork. If you have not heard from [place] regarding the results in 2 weeks, please contact this office.     Dizziness Dizziness is a common problem. It is a feeling of unsteadiness or light-headedness. You may feel like you are about to faint. Dizziness can lead to injury if you stumble or fall. Anyone can become dizzy, but dizziness is more common in older adults. This condition can be caused by a number of things, including medicines, dehydration, or illness. Follow these instructions at home: Eating and drinking  Drink enough  fluid to keep your urine clear or pale yellow. This helps to keep you from becoming dehydrated. Try to drink more clear fluids, such as water.  Do not drink alcohol.  Limit your caffeine intake if told to do so by your health care provider. Check ingredients and  nutrition facts to see if a food or beverage contains caffeine.  Limit your salt (sodium) intake if told to do so by your health care provider. Check ingredients and nutrition facts to see if a food or beverage contains sodium. Activity  Avoid making quick movements. ? Rise slowly from chairs and steady yourself until you feel okay. ? In the morning, first sit up on the side of the bed. When you feel okay, stand slowly while you hold onto something until you know that your balance is fine.  If you need to stand in one place for a long time, move your legs often. Tighten and relax the muscles in your legs while you are standing.  Do not drive or use heavy machinery if you feel dizzy.  Avoid bending down if you feel dizzy. Place items in your home so that they are easy for you to reach without leaning over. Lifestyle  Do not use any products that contain nicotine or tobacco, such as cigarettes and e-cigarettes. If you need help quitting, ask your health care provider.  Try to reduce your stress level by using methods such as yoga or meditation. Talk with your health care provider if you need help to manage your stress. General instructions  Watch your dizziness for any changes.  Take over-the-counter and prescription medicines only as told by your health care provider. Talk with your health care provider if you think that your dizziness is caused by a medicine that you are taking.  Tell a friend or a family member that you are feeling dizzy. If he or she notices any changes in your behavior, have this person call your health care provider.  Keep all follow-up visits as told by your health care provider. This is important. Contact a health care provider if:  Your dizziness does not go away.  Your dizziness or light-headedness gets worse.  You feel nauseous.  You have reduced hearing.  You have new symptoms.  You are unsteady on your feet or you feel like the room is  spinning. Get help right away if:  You vomit or have diarrhea and are unable to eat or drink anything.  You have problems talking, walking, swallowing, or using your arms, hands, or legs.  You feel generally weak.  You are not thinking clearly or you have trouble forming sentences. It may take a friend or family member to notice this.  You have chest pain, abdominal pain, shortness of breath, or sweating.  Your vision changes.  You have any bleeding.  You have a severe headache.  You have neck pain or a stiff neck.  You have a fever. These symptoms may represent a serious problem that is an emergency. Do not wait to see if the symptoms will go away. Get medical help right away. Call your local emergency services (911 in the [place] Do not drive yourself to the hospital. Summary  Dizziness is a feeling of unsteadiness or light-headedness. This condition can be caused by a number of things, including medicines, dehydration, or illness.  Anyone can become dizzy, but dizziness is more common in older adults.  Drink enough fluid to keep your urine clear or pale yellow. Do not  drink alcohol.  Avoid making quick movements if you feel dizzy. Monitor your dizziness for any changes. This information is not intended to replace advice given to you by your health care provider. Make sure you discuss any questions you have with your health care provider. Document Revised: 08/06/2017 Document Reviewed: 09/05/2016 Elsevier Patient Education  2021 [place].      Signed, Merri Ray, MD [facility]

## 2020-10-18 NOTE — Patient Instructions (Addendum)
Dizziness could be related to allergies or congestion in the sinuses.  Try fluticasone nasal spray 1 spray in each nostril once per day for allergies.  Make sure to stay well-hydrated.  If dizziness does not continue to improve this week and or any worsening symptoms return for other testing. Return to the clinic or go to the nearest emergency room if any of your symptoms worsen or new symptoms occur.  If you have lab work done today you will be contacted with your lab results within the next 2 weeks.  If you have not heard from [place] then please contact us. The fastest way to get your results is to register for My Chart.   IF you received an x-ray today, you will receive an invoice from [facility]. Please contact [facility] at [phone] with questions or concerns regarding your invoice.   IF you received labwork today, you will receive an invoice from [facility]. Please contact [facility] at [phone] with questions or concerns regarding your invoice.   Our billing staff will not be able to assist you with questions regarding bills from these companies.  You will be contacted with the lab results as soon as they are available. The fastest way to get your results is to activate your My Chart account. Instructions are located on the last page of this paperwork. If you have not heard from [place] regarding the results in 2 weeks, please contact this office.     Dizziness Dizziness is a common problem. It is a feeling of unsteadiness or light-headedness. You may feel like you are about to faint. Dizziness can lead to injury if you stumble or fall. Anyone can become dizzy, but dizziness is more common in older adults. This condition can be caused by a number of things, including medicines, dehydration, or illness. Follow these instructions at home: Eating and drinking  Drink enough fluid to keep your urine clear or pale yellow. This helps to keep you from becoming dehydrated. Try to  drink more clear fluids, such as water.  Do not drink alcohol.  Limit your caffeine intake if told to do so by your health care provider. Check ingredients and nutrition facts to see if a food or beverage contains caffeine.  Limit your salt (sodium) intake if told to do so by your health care provider. Check ingredients and nutrition facts to see if a food or beverage contains sodium. Activity  Avoid making quick movements. ? Rise slowly from chairs and steady yourself until you feel okay. ? In the morning, first sit up on the side of the bed. When you feel okay, stand slowly while you hold onto something until you know that your balance is fine.  If you need to stand in one place for a long time, move your legs often. Tighten and relax the muscles in your legs while you are standing.  Do not drive or use heavy machinery if you feel dizzy.  Avoid bending down if you feel dizzy. Place items in your home so that they are easy for you to reach without leaning over. Lifestyle  Do not use any products that contain nicotine or tobacco, such as cigarettes and e-cigarettes. If you need help quitting, ask your health care provider.  Try to reduce your stress level by using methods such as yoga or meditation. Talk with your health care provider if you need help to manage your stress. General instructions  Watch your dizziness for any changes.  Take over-the-counter and prescription  medicines only as told by your health care provider. Talk with your health care provider if you think that your dizziness is caused by a medicine that you are taking.  Tell a friend or a family member that you are feeling dizzy. If he or she notices any changes in your behavior, have this person call your health care provider.  Keep all follow-up visits as told by your health care provider. This is important. Contact a health care provider if:  Your dizziness does not go away.  Your dizziness or light-headedness  gets worse.  You feel nauseous.  You have reduced hearing.  You have new symptoms.  You are unsteady on your feet or you feel like the room is spinning. Get help right away if:  You vomit or have diarrhea and are unable to eat or drink anything.  You have problems talking, walking, swallowing, or using your arms, hands, or legs.  You feel generally weak.  You are not thinking clearly or you have trouble forming sentences. It may take a friend or family member to notice this.  You have chest pain, abdominal pain, shortness of breath, or sweating.  Your vision changes.  You have any bleeding.  You have a severe headache.  You have neck pain or a stiff neck.  You have a fever. These symptoms may represent a serious problem that is an emergency. Do not wait to see if the symptoms will go away. Get medical help right away. Call your local emergency services (911 in the [place] Do not drive yourself to the hospital. Summary  Dizziness is a feeling of unsteadiness or light-headedness. This condition can be caused by a number of things, including medicines, dehydration, or illness.  Anyone can become dizzy, but dizziness is more common in older adults.  Drink enough fluid to keep your urine clear or pale yellow. Do not drink alcohol.  Avoid making quick movements if you feel dizzy. Monitor your dizziness for any changes. This information is not intended to replace advice given to you by your health care provider. Make sure you discuss any questions you have with your health care provider. Document Revised: 08/06/2017 Document Reviewed: 09/05/2016 Elsevier Patient Education  2021 [employer].

## 2020-10-21 DIAGNOSIS — M545 Low back pain, unspecified: Secondary | ICD-10-CM | POA: Diagnosis not present

## 2020-10-21 DIAGNOSIS — M25562 Pain in left knee: Secondary | ICD-10-CM | POA: Diagnosis not present

## 2020-10-21 DIAGNOSIS — M5459 Other low back pain: Secondary | ICD-10-CM | POA: Diagnosis not present

## 2020-10-21 DIAGNOSIS — M25561 Pain in right knee: Secondary | ICD-10-CM | POA: Diagnosis not present

## 2020-10-23 ENCOUNTER — Ambulatory Visit: Payer: Medicare Other | Admitting: Orthopaedic Surgery

## 2020-10-23 DIAGNOSIS — M25561 Pain in right knee: Secondary | ICD-10-CM | POA: Diagnosis not present

## 2020-10-23 DIAGNOSIS — M545 Low back pain, unspecified: Secondary | ICD-10-CM | POA: Diagnosis not present

## 2020-10-23 DIAGNOSIS — M25562 Pain in left knee: Secondary | ICD-10-CM | POA: Diagnosis not present

## 2020-10-23 DIAGNOSIS — M5459 Other low back pain: Secondary | ICD-10-CM | POA: Diagnosis not present

## 2020-11-01 DIAGNOSIS — M5459 Other low back pain: Secondary | ICD-10-CM | POA: Diagnosis not present

## 2020-11-01 DIAGNOSIS — M545 Low back pain, unspecified: Secondary | ICD-10-CM | POA: Diagnosis not present

## 2020-11-01 DIAGNOSIS — M25561 Pain in right knee: Secondary | ICD-10-CM | POA: Diagnosis not present

## 2020-11-01 DIAGNOSIS — M25562 Pain in left knee: Secondary | ICD-10-CM | POA: Diagnosis not present

## 2020-11-04 DIAGNOSIS — M545 Low back pain, unspecified: Secondary | ICD-10-CM | POA: Diagnosis not present

## 2020-11-04 DIAGNOSIS — M25561 Pain in right knee: Secondary | ICD-10-CM | POA: Diagnosis not present

## 2020-11-04 DIAGNOSIS — M5459 Other low back pain: Secondary | ICD-10-CM | POA: Diagnosis not present

## 2020-11-04 DIAGNOSIS — M25562 Pain in left knee: Secondary | ICD-10-CM | POA: Diagnosis not present

## 2020-11-06 ENCOUNTER — Ambulatory Visit (INDEPENDENT_AMBULATORY_CARE_PROVIDER_SITE_OTHER): Payer: Medicare Other | Admitting: Orthopaedic Surgery

## 2020-11-06 ENCOUNTER — Encounter: Payer: Self-pay | Admitting: Orthopaedic Surgery

## 2020-11-06 VITALS — Ht 64.0 in | Wt 228.4 lb

## 2020-11-06 DIAGNOSIS — M1711 Unilateral primary osteoarthritis, right knee: Secondary | ICD-10-CM

## 2020-11-06 DIAGNOSIS — M1712 Unilateral primary osteoarthritis, left knee: Secondary | ICD-10-CM | POA: Diagnosis not present

## 2020-11-06 DIAGNOSIS — M25561 Pain in right knee: Secondary | ICD-10-CM | POA: Diagnosis not present

## 2020-11-06 DIAGNOSIS — M25562 Pain in left knee: Secondary | ICD-10-CM | POA: Diagnosis not present

## 2020-11-06 DIAGNOSIS — M545 Low back pain, unspecified: Secondary | ICD-10-CM | POA: Diagnosis not present

## 2020-11-06 DIAGNOSIS — M5459 Other low back pain: Secondary | ICD-10-CM | POA: Diagnosis not present

## 2020-11-06 NOTE — Progress Notes (Signed)
HPI: Mrs. Lindell Noe returns today follow-up of her low back pain and bilateral knee pain.  She states the injections on 09/25/2020 really helped with the knee pain.  She states she does not feel the bone-on-bone pain she was having prior to the injections.  She is been going to formal therapy for her back and her knees and feels this is helping she would like to continue therapy.  She is back in aquatic therapy.  Back pain is much improved she continues to have no radicular symptoms down either leg.  Physical exam: General: Well-developed well-nourished female no acute distress.  Ambulates without any assistive device.   Bilateral knees good range of motion both knees.  Patellofemoral crepitus left greater than right.  No abnormal warmth erythema or effusion of either knee.  No instability valgus varus stressing of either knee.  Calves are supple nontender bilaterally.  Impression: Low back pain improved Bilateral knee tricompartmental arthritis  Plan: She is written a new prescription for physical therapy for her back and knees.  She understands to wait at least 3 months between injections in both knees.  Follow-up with [place] as needed.  Questions encouraged and answered.  She will continue Voltaren gel to the knees as needed.

## 2020-11-14 DIAGNOSIS — M5459 Other low back pain: Secondary | ICD-10-CM | POA: Diagnosis not present

## 2020-11-14 DIAGNOSIS — M25561 Pain in right knee: Secondary | ICD-10-CM | POA: Diagnosis not present

## 2020-11-14 DIAGNOSIS — M25562 Pain in left knee: Secondary | ICD-10-CM | POA: Diagnosis not present

## 2020-11-14 DIAGNOSIS — M545 Low back pain, unspecified: Secondary | ICD-10-CM | POA: Diagnosis not present

## 2020-11-18 DIAGNOSIS — M25562 Pain in left knee: Secondary | ICD-10-CM | POA: Diagnosis not present

## 2020-11-18 DIAGNOSIS — M25561 Pain in right knee: Secondary | ICD-10-CM | POA: Diagnosis not present

## 2020-11-18 DIAGNOSIS — M545 Low back pain, unspecified: Secondary | ICD-10-CM | POA: Diagnosis not present

## 2020-11-18 DIAGNOSIS — M5459 Other low back pain: Secondary | ICD-10-CM | POA: Diagnosis not present

## 2020-11-20 DIAGNOSIS — M25561 Pain in right knee: Secondary | ICD-10-CM | POA: Diagnosis not present

## 2020-11-20 DIAGNOSIS — M5459 Other low back pain: Secondary | ICD-10-CM | POA: Diagnosis not present

## 2020-11-20 DIAGNOSIS — M25562 Pain in left knee: Secondary | ICD-10-CM | POA: Diagnosis not present

## 2020-11-22 ENCOUNTER — Other Ambulatory Visit: Payer: Self-pay

## 2020-11-22 ENCOUNTER — Ambulatory Visit (INDEPENDENT_AMBULATORY_CARE_PROVIDER_SITE_OTHER): Payer: Medicare Other

## 2020-11-22 DIAGNOSIS — B351 Tinea unguium: Secondary | ICD-10-CM

## 2020-11-22 NOTE — Progress Notes (Signed)
Patient presents today for the 6th laser treatment. Diagnosed with mycotic nail infection by Dr. Cannon Kettle.   Toenail most affected are the hallux nails bilateral. The nails are clearing. There is darkened vertical line still in the middle of the right hallux.  The nails are improving.  All other systems are negative.  Nails were filed thin. Laser therapy was administered to 1st toenails bilateral and patient tolerated the treatment well. All safety precautions were in place.    Patient has completed the recommended laser treatments. He will follow up with Dr. Cannon Kettle in 3 months to evaluate progress.

## 2020-11-22 NOTE — Patient Instructions (Signed)

## 2020-11-25 DIAGNOSIS — M5459 Other low back pain: Secondary | ICD-10-CM | POA: Diagnosis not present

## 2020-11-25 DIAGNOSIS — M25562 Pain in left knee: Secondary | ICD-10-CM | POA: Diagnosis not present

## 2020-11-25 DIAGNOSIS — M25561 Pain in right knee: Secondary | ICD-10-CM | POA: Diagnosis not present

## 2020-11-27 DIAGNOSIS — M25562 Pain in left knee: Secondary | ICD-10-CM | POA: Diagnosis not present

## 2020-11-27 DIAGNOSIS — M25561 Pain in right knee: Secondary | ICD-10-CM | POA: Diagnosis not present

## 2020-11-27 DIAGNOSIS — M5459 Other low back pain: Secondary | ICD-10-CM | POA: Diagnosis not present

## 2020-12-04 DIAGNOSIS — M25561 Pain in right knee: Secondary | ICD-10-CM | POA: Diagnosis not present

## 2020-12-04 DIAGNOSIS — M25562 Pain in left knee: Secondary | ICD-10-CM | POA: Diagnosis not present

## 2020-12-04 DIAGNOSIS — M5459 Other low back pain: Secondary | ICD-10-CM | POA: Diagnosis not present

## 2020-12-06 DIAGNOSIS — M25561 Pain in right knee: Secondary | ICD-10-CM | POA: Diagnosis not present

## 2020-12-06 DIAGNOSIS — M25562 Pain in left knee: Secondary | ICD-10-CM | POA: Diagnosis not present

## 2020-12-06 DIAGNOSIS — M5459 Other low back pain: Secondary | ICD-10-CM | POA: Diagnosis not present

## 2020-12-09 DIAGNOSIS — M25561 Pain in right knee: Secondary | ICD-10-CM | POA: Diagnosis not present

## 2020-12-09 DIAGNOSIS — M25562 Pain in left knee: Secondary | ICD-10-CM | POA: Diagnosis not present

## 2020-12-09 DIAGNOSIS — M5459 Other low back pain: Secondary | ICD-10-CM | POA: Diagnosis not present

## 2020-12-11 DIAGNOSIS — M25562 Pain in left knee: Secondary | ICD-10-CM | POA: Diagnosis not present

## 2020-12-11 DIAGNOSIS — M25561 Pain in right knee: Secondary | ICD-10-CM | POA: Diagnosis not present

## 2020-12-11 DIAGNOSIS — M5459 Other low back pain: Secondary | ICD-10-CM | POA: Diagnosis not present

## 2020-12-12 ENCOUNTER — Other Ambulatory Visit: Payer: Self-pay | Admitting: Emergency Medicine

## 2020-12-12 DIAGNOSIS — Z1231 Encounter for screening mammogram for malignant neoplasm of breast: Secondary | ICD-10-CM

## 2020-12-13 DIAGNOSIS — S62657A Nondisplaced fracture of medial phalanx of left little finger, initial encounter for closed fracture: Secondary | ICD-10-CM | POA: Diagnosis not present

## 2020-12-13 DIAGNOSIS — M79645 Pain in left finger(s): Secondary | ICD-10-CM | POA: Diagnosis not present

## 2020-12-17 DIAGNOSIS — M5459 Other low back pain: Secondary | ICD-10-CM | POA: Diagnosis not present

## 2020-12-17 DIAGNOSIS — M25562 Pain in left knee: Secondary | ICD-10-CM | POA: Diagnosis not present

## 2020-12-17 DIAGNOSIS — M25561 Pain in right knee: Secondary | ICD-10-CM | POA: Diagnosis not present

## 2020-12-19 DIAGNOSIS — M25561 Pain in right knee: Secondary | ICD-10-CM | POA: Diagnosis not present

## 2020-12-19 DIAGNOSIS — M5459 Other low back pain: Secondary | ICD-10-CM | POA: Diagnosis not present

## 2020-12-19 DIAGNOSIS — M25562 Pain in left knee: Secondary | ICD-10-CM | POA: Diagnosis not present

## 2020-12-19 DIAGNOSIS — M79645 Pain in left finger(s): Secondary | ICD-10-CM | POA: Diagnosis not present

## 2020-12-24 DIAGNOSIS — M25561 Pain in right knee: Secondary | ICD-10-CM | POA: Diagnosis not present

## 2020-12-24 DIAGNOSIS — M25562 Pain in left knee: Secondary | ICD-10-CM | POA: Diagnosis not present

## 2020-12-24 DIAGNOSIS — M5459 Other low back pain: Secondary | ICD-10-CM | POA: Diagnosis not present

## 2020-12-27 DIAGNOSIS — M25561 Pain in right knee: Secondary | ICD-10-CM | POA: Diagnosis not present

## 2020-12-27 DIAGNOSIS — M5459 Other low back pain: Secondary | ICD-10-CM | POA: Diagnosis not present

## 2020-12-27 DIAGNOSIS — M25562 Pain in left knee: Secondary | ICD-10-CM | POA: Diagnosis not present

## 2020-12-31 DIAGNOSIS — M5459 Other low back pain: Secondary | ICD-10-CM | POA: Diagnosis not present

## 2020-12-31 DIAGNOSIS — M25561 Pain in right knee: Secondary | ICD-10-CM | POA: Diagnosis not present

## 2020-12-31 DIAGNOSIS — M25562 Pain in left knee: Secondary | ICD-10-CM | POA: Diagnosis not present

## 2021-01-01 ENCOUNTER — Encounter: Payer: Self-pay | Admitting: Emergency Medicine

## 2021-01-15 ENCOUNTER — Encounter: Payer: Self-pay | Admitting: Physician Assistant

## 2021-01-15 ENCOUNTER — Ambulatory Visit (INDEPENDENT_AMBULATORY_CARE_PROVIDER_SITE_OTHER): Payer: Medicare Other | Admitting: Physician Assistant

## 2021-01-15 ENCOUNTER — Other Ambulatory Visit: Payer: Self-pay

## 2021-01-15 DIAGNOSIS — M1711 Unilateral primary osteoarthritis, right knee: Secondary | ICD-10-CM

## 2021-01-15 DIAGNOSIS — M1712 Unilateral primary osteoarthritis, left knee: Secondary | ICD-10-CM

## 2021-01-15 MED ORDER — LIDOCAINE HCL 1 % IJ SOLN
0.5000 mL | INTRAMUSCULAR | Status: AC | PRN
  Administered 2021-01-15: .5 mL

## 2021-01-15 MED ORDER — METHYLPREDNISOLONE ACETATE 40 MG/ML IJ SUSP
40.0000 mg | INTRAMUSCULAR | Status: AC | PRN
  Administered 2021-01-15: 40 mg via INTRA_ARTICULAR

## 2021-01-15 NOTE — Progress Notes (Signed)
   Procedure Note  Patient: Tammy Mccullough             Date of Birth: 1940/09/01           MRN: [id number]             Visit Date: 01/15/2021  HPI: Ms. Kiper 80 year old female with known tricompartmental arthritis bilateral knees.  Comes in today requesting repeat injections both knees.  She last had injections both knees on 09/25/2020.  States injections helped with both knees.  She is having minimal pain in left knee.  States she has had increasing pain in the right knee over the last couple weeks.  No known injuries.  Review of systems: Denies any fevers, chills, vaccines for last 2 weeks.  Physical exam: Bilateral knees good range of motion of both knees.  No abnormal warmth erythema or effusion.  Procedures: Visit Diagnoses:  1. Primary osteoarthritis of right knee   2. Unilateral primary osteoarthritis, left knee     Large Joint Inj: bilateral knee on 01/15/2021 4:25 PM Indications: pain Details: 22 G 1.5 in needle, anterolateral approach  Arthrogram: No  Medications (Right): 0.5 mL lidocaine 1 %; 40 mg methylPREDNISolone acetate 40 MG/ML Medications (Left): 0.5 mL lidocaine 1 %; 40 mg methylPREDNISolone acetate 40 MG/ML Outcome: tolerated well, no immediate complications Procedure, treatment alternatives, risks and benefits explained, specific risks discussed. Consent was given by the patient. Immediately prior to procedure a time out was called to verify the correct patient, procedure, equipment, support staff and site/side marked as required. Patient was prepped and draped in the usual sterile fashion.     Plan: She understands to wait at least 3 months between injections.  Questions encouraged and answered.  Follow-up as needed.

## 2021-01-16 ENCOUNTER — Ambulatory Visit (INDEPENDENT_AMBULATORY_CARE_PROVIDER_SITE_OTHER): Payer: Medicare Other | Admitting: Emergency Medicine

## 2021-01-16 ENCOUNTER — Other Ambulatory Visit: Payer: Self-pay

## 2021-01-16 ENCOUNTER — Encounter: Payer: Self-pay | Admitting: Emergency Medicine

## 2021-01-16 VITALS — BP 138/76 | HR 107 | Temp 98.4°F | Ht 64.0 in | Wt 227.6 lb

## 2021-01-16 DIAGNOSIS — Z6841 Body Mass Index (BMI) 40.0 and over, adult: Secondary | ICD-10-CM

## 2021-01-16 DIAGNOSIS — N1832 Chronic kidney disease, stage 3b: Secondary | ICD-10-CM | POA: Diagnosis not present

## 2021-01-16 DIAGNOSIS — Z7689 Persons encountering health services in other specified circumstances: Secondary | ICD-10-CM

## 2021-01-16 DIAGNOSIS — Z8739 Personal history of other diseases of the musculoskeletal system and connective tissue: Secondary | ICD-10-CM

## 2021-01-16 DIAGNOSIS — I10 Essential (primary) hypertension: Secondary | ICD-10-CM | POA: Diagnosis not present

## 2021-01-16 NOTE — Progress Notes (Signed)
Tammy Mccullough 80 y.o.   Chief Complaint  Patient presents with  . New Patient (Initial Visit)    HISTORY OF PRESENT ILLNESS: This is a 80 y.o. female former patient of Dr. Carlota Mccullough here to establish care with me. Has history of hypertension: On amlodipine 5 mg daily and Benicar HCT 40-25 mg daily.  Doing well with normal blood pressure readings at home. History of chronic kidney disease stage IIIb, stable. History of osteoarthritis.  Sees orthopedic surgeon on a regular basis.  Knees injected yesterday. No complaints or medical concerns today.  HPI   Prior to Admission medications   Medication Sig Start Date End Date Taking? Authorizing Provider  acetaminophen (TYLENOL) 500 MG tablet Take 500 mg by mouth every 8 (eight) hours as needed.    [provider]  amLODipine (NORVASC) 5 MG tablet Take 1 tablet (5 mg total) by mouth daily. 08/28/20   Wendie Agreste, MD  aspirin 81 MG tablet Take 81 mg by mouth daily. Reported on 02/15/2016    [provider]  AZOPT 1 % ophthalmic suspension  10/03/16   [provider]  diclofenac Sodium (VOLTAREN) 1 % GEL Apply 2 g topically 4 (four) times daily. 08/28/20   Wendie Agreste, MD  fluticasone (FLONASE) 50 MCG/ACT nasal spray Place 1 spray into both nostrils daily. 10/18/20   Wendie Agreste, MD  olmesartan-hydrochlorothiazide (BENICAR HCT) 40-25 MG tablet Take 1 tablet by mouth daily. 08/28/20   Wendie Agreste, MD  ZIOPTAN 0.0015 % SOLN  09/26/16   [provider]    No Known Allergies  Patient Active Problem List   Diagnosis Date Noted  . Body mass index (BMI) of 40.0-44.9 in adult ([place]) 10/25/2017  . 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 Glucose intolerance (impaired glucose tolerance) 04/21/2016  . Chronic renal insufficiency 09/09/2015  . Glaucoma 02/09/2013  . HTN (hypertension) 02/09/2013    Past  Medical History:  Diagnosis Date  . Arthritis    B knees.  . Cataract   . Glaucoma   . Glucose intolerance (impaired glucose tolerance)   . Hypertension   . Renal insufficiency     Past Surgical History:  Procedure Laterality Date  . CATARACT EXTRACTION Right 07-2014  . CATARACT EXTRACTION Left 01/16/2016  . COLONOSCOPY     10 +yrs ago in , normal per pt  . VAGINAL DELIVERY     x2    Social History   Socioeconomic History  . Marital status: Widowed    Spouse name: Not on file  . Number of children: 2  . Years of education: Not on file  . Highest education level: Not on file  Occupational History  . Occupation: retired  Tobacco Use  . Smoking status: Never Smoker  . Smokeless tobacco: Never Used  Substance and Sexual Activity  . Alcohol use: Yes    Alcohol/week: 6.0 standard drinks    Types: 6 Standard drinks or equivalent per week    Comment: 2-3  . Drug use: No  . Sexual activity: Not Currently  Other Topics Concern  . Not on file  Social History Narrative   Marital status: widowed since 2010 after 80 years of marriage.  Not dating but would like to be.      Children:  2 Children; no grandchildren; 2 adopted grandchildren.  One son is disabled; patient supports; son is obese.  Living: alone with 1 cat.      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      Tobacco: never      Alcohol: weekends liquor; some during the week.  3-5 cups per week.      Drugs: none      Exercise: water aerobics three days per week.  [employer].      Seatbelt: 100%;      Guns: none      ADLs:: independent with ADLs.   Drives. No assistant devices.      Advanced Directives: yes; healthcare POA:  Older brother [name]);  FULL CODE but no prolonged measures.     Social Determinants of Health   Financial Resource Strain: Not on file  Food Insecurity: Not on file  Transportation Needs: Not on file  Physical  Activity: Not on file  Stress: Not on file  Social Connections: Not on file  Intimate Partner Violence: Not on file    Family History  Problem Relation Age of Onset  . Diabetes Mother   . Heart disease Mother        unsure  . COPD Father   . Diabetes Sister   . Hypertension Sister   . Kidney disease Sister   . Diabetes Brother   . Diabetes Brother   . Colon cancer Neg Hx   . Rectal cancer Neg Hx   . Stomach cancer Neg Hx      Review of Systems  Constitutional: Negative.  Negative for chills and fever.  HENT: Negative.  Negative for congestion and sore throat.   Respiratory: Negative.  Negative for cough and shortness of breath.   Cardiovascular: Negative.  Negative for chest pain and palpitations.  Gastrointestinal: Negative.  Negative for abdominal pain, nausea and vomiting.  Genitourinary: Negative.  Negative for dysuria and hematuria.  Musculoskeletal: Positive for joint pain.  Skin: Negative.  Negative for rash.  Neurological: Negative.  Negative for dizziness and headaches.  All other systems reviewed and are negative.    Today's Vitals   01/16/21 0800  BP: 138/76  Pulse: (!) 107  Temp: 98.4 F (36.9 C)  TempSrc: Oral  SpO2: 97%  Weight: 227 lb 9.6 oz (103.2 kg)  Height: 5' 4"  (1.626 m)   Body mass index is 39.07 kg/m.    Physical Exam Vitals reviewed.  Constitutional:      Appearance: Normal appearance.  HENT:     Head: Normocephalic.  Eyes:     Extraocular Movements: Extraocular movements intact.     Pupils: Pupils are equal, round, and reactive to light.  Cardiovascular:     Rate and Rhythm: Normal rate and regular rhythm.     Pulses: Normal pulses.     Heart sounds: Normal heart sounds.  Pulmonary:     Effort: Pulmonary effort is normal.     Breath sounds: Normal breath sounds.  Musculoskeletal:     Cervical back: Neck supple. No tenderness.  Lymphadenopathy:     Cervical: No cervical adenopathy.  Skin:    Capillary Refill: Capillary  refill takes less than 2 seconds.  Neurological:     General: No focal deficit present.     Mental Status: She is alert and oriented to person, place, and time.  Psychiatric:        Mood and Affect: Mood normal.        Behavior: Behavior normal.      ASSESSMENT & PLAN:  A total of 30 minutes was spent with the patient and counseling/coordination of care regarding chronic medical problems under management including hypertension, review of most recent office visit notes by Dr. Carlota Mccullough, review of all medications, education on nutrition, health maintenance items, fall precautions, establishing care with me, review of past medical history, documentation, prognosis and need for follow-up.  HTN (hypertension) Well-controlled hypertension.  Continue amlodipine 5 mg daily and Benicar HCTZ 40- 25 mg daily.  Diet and nutrition discussed.  Chronic renal insufficiency Stable.  Diet and nutrition discussed.  Body mass index (BMI) of 40.0-44.9 in adult [facility]) Diet and nutrition discussed.  Tammy Mccullough was seen today for new patient (initial visit).  Diagnoses and all orders for this visit:  Essential hypertension -     AMB Referral to [facility]  Stage 3b chronic kidney disease ([place]) -     AMB Referral to [place]  Encounter to establish care  History of osteoarthritis -     AMB Referral to [place]  Body mass index (BMI) of 40.0-44.9 in adult ([place]) -     AMB Referral to [facility]  Chronic renal impairment, stage 3b [facility])    Patient Instructions   Preventive Care 13 Years and Older, Female Preventive care refers to lifestyle choices and visits with your health care provider that can promote health and wellness. This includes:  A yearly physical exam. This is also called an annual wellness visit.  Regular dental and eye exams.  Immunizations.  Screening for certain conditions.  Healthy lifestyle choices, such  as: ? Eating a healthy diet. ? Getting regular exercise. ? Not using drugs or products that contain nicotine and tobacco. ? Limiting alcohol use. What can I expect for my preventive care visit? Physical exam Your health care provider will check your:  Height and weight. These may be used to calculate your BMI (body mass index). BMI is a measurement that tells if you are at a healthy weight.  Heart rate and blood pressure.  Body temperature.  Skin for abnormal spots. Counseling Your health care provider may ask you questions about your:  Past medical problems.  Family's medical history.  Alcohol, tobacco, and drug use.  Emotional well-being.  Home life and relationship well-being.  Sexual activity.  Diet, exercise, and sleep habits.  History of falls.  Memory and ability to understand (cognition).  Work and work Statistician.  Pregnancy and menstrual history.  Access to firearms. What immunizations do I need? Vaccines are usually given at various ages, according to a schedule. Your health care provider will recommend vaccines for you based on your age, medical history, and lifestyle or other factors, such as travel or where you work.   What tests do I need? Blood tests  Lipid and cholesterol levels. These may be checked every 5 years, or more often depending on your overall health.  Hepatitis C test.  Hepatitis B test. Screening  Lung cancer screening. You may have this screening every year starting at age 44 if you have a 30-pack-year history of smoking and currently smoke or have quit within the past 15 years.  Colorectal cancer screening. ? All adults should have this screening starting at age 36 and continuing until age 7. ? Your health care provider may recommend screening at age 62 if you are at increased risk. ? You will have tests every 1-10 years, depending on your results and the type of screening test.  Diabetes screening. ? This is  done by  checking your blood sugar (glucose) after you have not eaten for a while (fasting). ? You may have this done every 1-3 years.  Mammogram. ? This may be done every 1-2 years. ? Talk with your health care provider about how often you should have regular mammograms.  Abdominal aortic aneurysm (AAA) screening. You may need this if you are a current or former smoker.  BRCA-related cancer screening. This may be done if you have a family history of breast, ovarian, tubal, or peritoneal cancers. Other tests  STD (sexually transmitted disease) testing, if you are at risk.  Bone density scan. This is done to screen for osteoporosis. You may have this done starting at age 98. Talk with your health care provider about your test results, treatment options, and if necessary, the need for more tests. Follow these instructions at home: Eating and drinking  Eat a diet that includes fresh fruits and vegetables, whole grains, lean protein, and low-fat dairy products. Limit your intake of foods with high amounts of sugar, saturated fats, and salt.  Take vitamin and mineral supplements as recommended by your health care provider.  Do not drink alcohol if your health care provider tells you not to drink.  If you drink alcohol: ? Limit how much you have to 0-1 drink a day. ? Be aware of how much alcohol is in your drink. In the [place] one drink equals one 12 oz bottle of beer (355 mL), one 5 oz glass of wine (148 mL), or one 1 oz glass of hard liquor (44 mL).   Lifestyle  Take daily care of your teeth and gums. Brush your teeth every morning and night with fluoride toothpaste. Floss one time each day.  Stay active. Exercise for at least 30 minutes 5 or more days each week.  Do not use any products that contain nicotine or tobacco, such as cigarettes, e-cigarettes, and chewing tobacco. If you need help quitting, ask your health care provider.  Do not use drugs.  If you are sexually active, practice  safe sex. Use a condom or other form of protection in order to prevent STIs (sexually transmitted infections).  Talk with your health care provider about taking a low-dose aspirin or statin.  Find healthy ways to cope with stress, such as: ? Meditation, yoga, or listening to music. ? Journaling. ? Talking to a trusted person. ? Spending time with friends and family. Safety  Always wear your seat belt while driving or riding in a vehicle.  Do not drive: ? If you have been drinking alcohol. Do not ride with someone who has been drinking. ? When you are tired or distracted. ? While texting.  Wear a helmet and other protective equipment during sports activities.  If you have firearms in your house, make sure you follow all gun safety procedures. What's next?  Visit your health care provider once a year for an annual wellness visit.  Ask your health care provider how often you should have your eyes and teeth checked.  Stay up to date on all vaccines. This information is not intended to replace advice given to you by your health care provider. Make sure you discuss any questions you have with your health care provider. Document Revised: 07/24/2020 Document Reviewed: 07/28/2018 Elsevier Patient Education  2021 [place], MD [facility]

## 2021-01-16 NOTE — Assessment & Plan Note (Signed)
Well-controlled hypertension.  Continue amlodipine 5 mg daily and Benicar HCTZ 40- 25 mg daily.  Diet and nutrition discussed.

## 2021-01-16 NOTE — Assessment & Plan Note (Signed)
Diet and nutrition discussed. 

## 2021-01-16 NOTE — Patient Instructions (Signed)
Preventive Care 82 Years and Older, Female Preventive care refers to lifestyle choices and visits with your health care provider that can promote health and wellness. This includes:  A yearly physical exam. This is also called an annual wellness visit.  Regular dental and eye exams.  Immunizations.  Screening for certain conditions.  Healthy lifestyle choices, such as: ? Eating a healthy diet. ? Getting regular exercise. ? Not using drugs or products that contain nicotine and tobacco. ? Limiting alcohol use. What can I expect for my preventive care visit? Physical exam Your health care provider will check your:  Height and weight. These may be used to calculate your BMI (body mass index). BMI is a measurement that tells if you are at a healthy weight.  Heart rate and blood pressure.  Body temperature.  Skin for abnormal spots. Counseling Your health care provider may ask you questions about your:  Past medical problems.  Family's medical history.  Alcohol, tobacco, and drug use.  Emotional well-being.  Home life and relationship well-being.  Sexual activity.  Diet, exercise, and sleep habits.  History of falls.  Memory and ability to understand (cognition).  Work and work Statistician.  Pregnancy and menstrual history.  Access to firearms. What immunizations do I need? Vaccines are usually given at various ages, according to a schedule. Your health care provider will recommend vaccines for you based on your age, medical history, and lifestyle or other factors, such as travel or where you work.   What tests do I need? Blood tests  Lipid and cholesterol levels. These may be checked every 5 years, or more often depending on your overall health.  Hepatitis C test.  Hepatitis B test. Screening  Lung cancer screening. You may have this screening every year starting at age 82 if you have a 30-pack-year history of smoking and currently smoke or have quit within  the past 15 years.  Colorectal cancer screening. ? All adults should have this screening starting at age 82 and continuing until age 82. ? Your health care provider may recommend screening at age 82 if you are at increased risk. ? You will have tests every 1-10 years, depending on your results and the type of screening test.  Diabetes screening. ? This is done by checking your blood sugar (glucose) after you have not eaten for a while (fasting). ? You may have this done every 1-3 years.  Mammogram. ? This may be done every 1-2 years. ? Talk with your health care provider about how often you should have regular mammograms.  Abdominal aortic aneurysm (AAA) screening. You may need this if you are a current or former smoker.  BRCA-related cancer screening. This may be done if you have a family history of breast, ovarian, tubal, or peritoneal cancers. Other tests  STD (sexually transmitted disease) testing, if you are at risk.  Bone density scan. This is done to screen for osteoporosis. You may have this done starting at age 82. Talk with your health care provider about your test results, treatment options, and if necessary, the need for more tests. Follow these instructions at home: Eating and drinking  Eat a diet that includes fresh fruits and vegetables, whole grains, lean protein, and low-fat dairy products. Limit your intake of foods with high amounts of sugar, saturated fats, and salt.  Take vitamin and mineral supplements as recommended by your health care provider.  Do not drink alcohol if your health care provider tells you not to drink.  If you drink alcohol: ? Limit how much you have to 0-1 drink a day. ? Be aware of how much alcohol is in your drink. In the [place] one drink equals one 12 oz bottle of beer (355 mL), one 5 oz glass of wine (148 mL), or one 1 oz glass of hard liquor (44 mL).   Lifestyle  Take daily care of your teeth and gums. Brush your teeth every morning  and night with fluoride toothpaste. Floss one time each day.  Stay active. Exercise for at least 30 minutes 5 or more days each week.  Do not use any products that contain nicotine or tobacco, such as cigarettes, e-cigarettes, and chewing tobacco. If you need help quitting, ask your health care provider.  Do not use drugs.  If you are sexually active, practice safe sex. Use a condom or other form of protection in order to prevent STIs (sexually transmitted infections).  Talk with your health care provider about taking a low-dose aspirin or statin.  Find healthy ways to cope with stress, such as: ? Meditation, yoga, or listening to music. ? Journaling. ? Talking to a trusted person. ? Spending time with friends and family. Safety  Always wear your seat belt while driving or riding in a vehicle.  Do not drive: ? If you have been drinking alcohol. Do not ride with someone who has been drinking. ? When you are tired or distracted. ? While texting.  Wear a helmet and other protective equipment during sports activities.  If you have firearms in your house, make sure you follow all gun safety procedures. What's next?  Visit your health care provider once a year for an annual wellness visit.  Ask your health care provider how often you should have your eyes and teeth checked.  Stay up to date on all vaccines. This information is not intended to replace advice given to you by your health care provider. Make sure you discuss any questions you have with your health care provider. Document Revised: 07/24/2020 Document Reviewed: 07/28/2018 Elsevier Patient Education  2021 Elsevier Inc.  

## 2021-01-16 NOTE — Assessment & Plan Note (Signed)
Stable.  Diet and nutrition discussed. 

## 2021-01-23 ENCOUNTER — Encounter: Payer: Self-pay | Admitting: Sports Medicine

## 2021-01-23 ENCOUNTER — Ambulatory Visit (INDEPENDENT_AMBULATORY_CARE_PROVIDER_SITE_OTHER): Payer: Medicare Other | Admitting: Sports Medicine

## 2021-01-23 ENCOUNTER — Other Ambulatory Visit: Payer: Self-pay

## 2021-01-23 DIAGNOSIS — M79675 Pain in left toe(s): Secondary | ICD-10-CM

## 2021-01-23 NOTE — Progress Notes (Signed)
Subjective: Tammy Mccullough is a 80 y.o. female patient seen today in office with complaint of a little soreness at the left great toenail at the lateral side states that she is coming in for treatment for nail fungus and her nails are growing out and states that she noticed that the pain is when she is wearing sneakers and socks to the jagged edge of her left great toenail.  Denies redness warmth swelling drainage or any signs of infection at this time.  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     amLODipine (NORVASC) 5 MG tablet Take 1 tablet (5 mg total) by mouth daily. 90 tablet 1   aspirin 81 MG tablet Take 81 mg by mouth daily. Reported on 02/15/2016     AZOPT 1 % ophthalmic suspension      diclofenac Sodium (VOLTAREN) 1 % GEL Apply 2 g topically 4 (four) times daily. 100 g 3   fluticasone (FLONASE) 50 MCG/ACT nasal spray Place 1 spray into both nostrils daily. (Patient not taking: Reported on 01/16/2021) 16 g 3   olmesartan-hydrochlorothiazide (BENICAR HCT) 40-25 MG tablet Take 1 tablet by mouth daily. 90 tablet 1   ZIOPTAN 0.0015 % SOLN      No current facility-administered medications on file prior to visit.    No Known Allergies  Objective: Physical Exam  General: Well developed, nourished, no acute distress, awake, alert and oriented x 3  Vascular: Dorsalis pedis artery 1/4 bilateral, Posterior tibial artery 1/4 bilateral, skin temperature  warm to warm proximal to distal bilateral lower extremities, no varicosities, pedal hair present bilateral.  Neurological: Gross sensation present via light touch bilateral.   Dermatological: Skin is warm, dry, and supple bilateral, bilateral hallux nails are thickened distally with irregular/jagged edges there is no acute ingrowing noted no redness no warmth no swelling no drainage.  No callus/corns/hyperkeratotic tissue present bilateral. No signs of infection bilateral.  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 Muscular strength within normal limits without painon range of motion. No pain with calf compression bilateral.  Assessment and Plan:  Problem List Items Addressed This Visit   None Visit Diagnoses     Pain around toenail, left foot    -  Primary       -Examined patient.  -Discussed care for pain around nail likely secondary to a thick toenail left greater than right hallux -Mechanically debrided and reduced mycotic nails with sterile nail nipper and dremel nail file without incident. -Advised soaking if needed especially after today's treatment there is any soreness with warm water and Epson salt -Advised patient to closely monitor if pain reoccurs to return to office- -Patient to return as scheduled or sooner if symptoms worsen.  Patient is safe to continue with laser treatment for nail fungus without interruption.  Landis Martins, DPM

## 2021-01-31 ENCOUNTER — Ambulatory Visit: Payer: Medicare Other

## 2021-02-06 ENCOUNTER — Telehealth: Payer: Self-pay | Admitting: *Deleted

## 2021-02-06 NOTE — Chronic Care Management (AMB) (Signed)
  Chronic Care Management   Outreach Note  02/06/2021 Name: Tammy Mccullough MRN: [id number] DOB: Dec 23, 1940  Tammy Mccullough is a 80 y.o. year old female who is a primary care patient of Mitchel Honour, Ines Bloomer, MD. I reached out to [name] by phone today in response to a referral sent by [name]'s PCP Horald Pollen, MD     An unsuccessful telephone outreach was attempted today. The patient was referred to the case management team for assistance with care management and care coordination.   Follow Up Plan: A HIPAA compliant phone message was left for the patient providing contact information and requesting a return call.  If patient returns call to provider office, please advise to call Embedded Care Management Care Guide Jaeshawn Silvio at [place], [place] [facility]  Direct Dial: [phone]

## 2021-02-08 ENCOUNTER — Other Ambulatory Visit: Payer: Self-pay

## 2021-02-08 ENCOUNTER — Ambulatory Visit
Admission: RE | Admit: 2021-02-08 | Discharge: 2021-02-08 | Disposition: A | Discharge status: Home / Self Care | Payer: Medicare Other | Source: Ambulatory Visit | Attending: Emergency Medicine | Admitting: Emergency Medicine

## 2021-02-08 DIAGNOSIS — Z1231 Encounter for screening mammogram for malignant neoplasm of breast: Secondary | ICD-10-CM | POA: Diagnosis not present

## 2021-02-18 NOTE — Chronic Care Management (AMB) (Signed)
  Chronic Care Management   Outreach Note  02/18/2021 Name: Tammy Mccullough MRN: [id number] DOB: 19-Jul-1941  Tammy Mccullough is a 80 y.o. year old female who is a primary care patient of Mitchel Honour, Ines Bloomer, MD. I reached out to [name] by phone today in response to a referral sent by [name]'s PCP Horald Pollen, MD     A second unsuccessful telephone outreach was attempted today. The patient was referred to the case management team for assistance with care management and care coordination.   Follow Up Plan: A HIPAA compliant phone message was left for the patient providing contact information and requesting a return call.  If patient returns call to provider office, please advise to call Embedded Care Management Care Guide Makynzi Eastland at [place], [place] Management  Direct Dial: [phone]

## 2021-02-25 NOTE — Chronic Care Management (AMB) (Signed)
  Chronic Care Management   Outreach Note  02/25/2021 Name: Tammy Mccullough MRN: [id number] DOB: 01-27-1941  Tammy Mccullough is a 80 y.o. year old female who is a primary care patient of Mitchel Honour, Ines Bloomer, MD. I reached out to [name] by phone today in response to a referral sent by [name]'s PCP Horald Pollen, MD     Third unsuccessful telephone outreach was attempted today. The patient was referred to the case management team for assistance with care management and care coordination. The patient's primary care provider has been notified of our unsuccessful attempts to make or maintain contact with the patient. The care management team is pleased to engage with this patient at any time in the future should he/she be interested in assistance from the care management team.   Follow Up Plan: We have been unable to make contact with the patient for follow up. The care management team is available to follow up with the patient after provider conversation with the patient regarding recommendation for care management engagement and subsequent re-referral to the care management team.   Julian Hy, [place] [facility]  Direct Dial: [phone]

## 2021-02-27 ENCOUNTER — Other Ambulatory Visit: Payer: Self-pay

## 2021-02-27 ENCOUNTER — Ambulatory Visit (INDEPENDENT_AMBULATORY_CARE_PROVIDER_SITE_OTHER): Payer: Medicare Other | Admitting: Sports Medicine

## 2021-02-27 ENCOUNTER — Encounter: Payer: Self-pay | Admitting: Sports Medicine

## 2021-02-27 DIAGNOSIS — M79675 Pain in left toe(s): Secondary | ICD-10-CM | POA: Diagnosis not present

## 2021-02-27 DIAGNOSIS — R7302 Impaired glucose tolerance (oral): Secondary | ICD-10-CM | POA: Diagnosis not present

## 2021-02-27 DIAGNOSIS — B351 Tinea unguium: Secondary | ICD-10-CM | POA: Diagnosis not present

## 2021-02-27 DIAGNOSIS — I739 Peripheral vascular disease, unspecified: Secondary | ICD-10-CM | POA: Diagnosis not present

## 2021-02-27 DIAGNOSIS — M79674 Pain in right toe(s): Secondary | ICD-10-CM | POA: Diagnosis not present

## 2021-02-27 NOTE — Progress Notes (Signed)
Subjective: Tammy Mccullough is a 80 y.o. female patient seen today in office with complaint of for follow up on fungal nails reports that she had laser 3 months ago and slowly things have improved still noticed a dark streak on her right great toenail reports that she cannot trim her nails because it is difficult for her to bend.  Patient denies any other pedal complaints at this time.  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     amLODipine (NORVASC) 5 MG tablet Take 1 tablet (5 mg total) by mouth daily. 90 tablet 1   aspirin 81 MG tablet Take 81 mg by mouth daily. Reported on 02/15/2016     AZOPT 1 % ophthalmic suspension      diclofenac Sodium (VOLTAREN) 1 % GEL Apply 2 g topically 4 (four) times daily. 100 g 3   fluticasone (FLONASE) 50 MCG/ACT nasal spray Place 1 spray into both nostrils daily. 16 g 3   olmesartan-hydrochlorothiazide (BENICAR HCT) 40-25 MG tablet Take 1 tablet by mouth daily. 90 tablet 1   ZIOPTAN 0.0015 % SOLN      No current facility-administered medications on file prior to visit.    No Known Allergies  Objective: Physical Exam  General: Well developed, nourished, no acute distress, awake, alert and oriented x 3  Vascular: Dorsalis pedis artery 1/4 bilateral, Posterior tibial artery 0/4 bilateral, skin temperature warm to warm proximal to distal bilateral lower extremities, moderate varicosities 1+ pitting edema bilateral  ankles, no pedal hair present bilateral.  Neurological: Gross sensation present via light touch bilateral.   Dermatological: Skin is warm, dry, and supple bilateral, Nails 1-10 are tender, long, thick, and discolored with mild subungal debris, mostly at the right hallux toenail with a small melanonychia ,no webspace macerations present bilateral, no open lesions present bilateral, no callus/corns/hyperkeratotic tissue present bilateral. No signs of infection bilateral.  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 Muscular strength within normal limits without painon range of motion. No pain with calf compression bilateral.  Assessment and Plan:  Problem List Items Addressed This Visit       Endocrine   Glucose intolerance (impaired glucose tolerance)   Other Visit Diagnoses     Pain due to onychomycosis of toenails of both feet    -  Primary   PVD (peripheral vascular disease) ([place])           -Examined patient.  -Discussed treatment options for painful mycotic nails. -Mechanically debrided and reduced mycotic nails with sterile nail nipper and dremel nail file without incident. -Advised patient that we will closely monitor her nails as they continue to grow out at this time there is no acute need for further laser treatment for topical antifungal -Patient to return in 3 months for follow up evaluation or sooner if symptoms worsen.  Landis Martins, DPM

## 2021-03-21 ENCOUNTER — Encounter: Payer: Medicare Other | Admitting: Family Medicine

## 2021-03-21 DIAGNOSIS — E785 Hyperlipidemia, unspecified: Secondary | ICD-10-CM

## 2021-03-21 DIAGNOSIS — R739 Hyperglycemia, unspecified: Secondary | ICD-10-CM

## 2021-03-21 DIAGNOSIS — Z0000 Encounter for general adult medical examination without abnormal findings: Secondary | ICD-10-CM

## 2021-03-21 DIAGNOSIS — I10 Essential (primary) hypertension: Secondary | ICD-10-CM

## 2021-03-25 DIAGNOSIS — H401332 Pigmentary glaucoma, bilateral, moderate stage: Secondary | ICD-10-CM | POA: Diagnosis not present

## 2021-03-25 DIAGNOSIS — H04123 Dry eye syndrome of bilateral lacrimal glands: Secondary | ICD-10-CM | POA: Diagnosis not present

## 2021-04-26 DIAGNOSIS — Z23 Encounter for immunization: Secondary | ICD-10-CM | POA: Diagnosis not present

## 2021-05-04 ENCOUNTER — Ambulatory Visit (INDEPENDENT_AMBULATORY_CARE_PROVIDER_SITE_OTHER): Payer: Medicare Other

## 2021-05-04 ENCOUNTER — Ambulatory Visit: Payer: Medicare Other

## 2021-05-04 DIAGNOSIS — Z0000 Encounter for general adult medical examination without abnormal findings: Secondary | ICD-10-CM | POA: Diagnosis not present

## 2021-05-04 NOTE — Progress Notes (Signed)
Subjective:   Tammy Mccullough is a 80 y.o. female who presents for an Initial Medicare Annual Wellness Visit.  Review of Systems    I connected with  Tammy Mccullough on 05/06/21 by an audio only telemedicine application and verified that I am speaking with the correct Tammy Mccullough using two identifiers.   I discussed the limitations, risks, security and privacy concerns of performing an evaluation and management service by telephone and the availability of in [name] appointments. I also discussed with the patient that there may be a patient responsible charge related to this service. The patient expressed understanding and verbally consented to this telephonic visit.  Location of Patient: Home Location of Provider: Office  List any persons and their role that are participating in the visit with the patient.  Kendi Cyndi Bender and Randa Evens Mizraim Harmening, CMA       Objective:    There were no vitals filed for this visit. There is no height or weight on file to calculate BMI.  Advanced Directives 10/25/2017 10/20/2016 09/09/2015 09/26/2014  Does Patient Have a Medical Advance Directive? Yes No No Yes  Type of Advance Directive Living will - - [place]  Does patient want to make changes to medical advance directive? No - Patient declined - - -  Would patient like information on creating a medical advance directive? - No - Patient declined Yes - Educational materials given -    Current Medications (verified) Outpatient Encounter Medications as of 05/04/2021  Medication Sig   acetaminophen (TYLENOL) 500 MG tablet Take 500 mg by mouth every 8 (eight) hours as needed.   amLODipine (NORVASC) 5 MG tablet Take 1 tablet (5 mg total) by mouth daily.   diclofenac Sodium (VOLTAREN) 1 % GEL Apply 2 g topically 4 (four) times daily.   olmesartan-hydrochlorothiazide (BENICAR HCT) 40-25 MG tablet Take 1 tablet by mouth daily.   Polyethyl Glycol-Propyl Glycol (SYSTANE HYDRATION PF OP) Apply to  eye. Dry eyes   ZIOPTAN 0.0015 % SOLN    [DISCONTINUED] aspirin 81 MG tablet Take 81 mg by mouth daily. Reported on 02/15/2016   [DISCONTINUED] AZOPT 1 % ophthalmic suspension    [DISCONTINUED] fluticasone (FLONASE) 50 MCG/ACT nasal spray Place 1 spray into both nostrils daily.   No facility-administered encounter medications on file as of 05/04/2021.    Allergies (verified) Patient has no known allergies.   History: Past Medical History:  Diagnosis Date   Arthritis    B knees.   Cataract    Glaucoma    Glucose intolerance (impaired glucose tolerance)    Hypertension    Renal insufficiency    Past Surgical History:  Procedure Laterality Date   CATARACT EXTRACTION Right 07-2014   CATARACT EXTRACTION Left 01/16/2016   COLONOSCOPY     10 +yrs ago in [place], normal per pt   VAGINAL DELIVERY     x2   Family History  Problem Relation Age of Onset   Diabetes Mother    Heart disease Mother        unsure   COPD Father    Diabetes Sister    Hypertension Sister    Kidney disease Sister    Diabetes Brother    Diabetes Brother    Colon cancer Neg Hx    Rectal cancer Neg Hx    Stomach cancer Neg Hx    Social History   Socioeconomic History   Marital status: Widowed    Spouse name: Not on file   Number of  children: 2   Years of education: Not on file   Highest education level: Not on file  Occupational History   Occupation: retired  Tobacco Use   Smoking status: Never   Smokeless tobacco: Never  Substance and Sexual Activity   Alcohol use: Yes    Alcohol/week: 6.0 standard drinks    Types: 6 Standard drinks or equivalent per week    Comment: 2-3   Drug use: No   Sexual activity: Not Currently  Other Topics Concern   Not on file  Social History Narrative   Marital status: widowed since 2010 after 15 years of marriage.  Not dating but would like to be.      Children:  2 Children; no grandchildren; 2 adopted grandchildren.  One son is disabled; patient supports; son is  obese.      Living: alone with 1 cat.      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      Tobacco: never      Alcohol: weekends liquor; some during the week.  3-5 cups per week.      Drugs: none      Exercise: water aerobics three days per week.  [employer].      Seatbelt: 100%;      Guns: none      ADLs:: independent with ADLs.   Drives. No assistant devices.      Advanced Directives: yes; healthcare POA:  Older brother [name]);  FULL CODE but no prolonged measures.     Social Determinants of Health   Financial Resource Strain: Not on file  Food Insecurity: Not on file  Transportation Needs: Not on file  Physical Activity: Not on file  Stress: Not on file  Social Connections: Not on file    Tobacco Counseling Counseling given: Not Answered   Clinical Intake:  Pre-visit preparation completed: Yes  Pain : No/denies pain     Nutritional Risks: Other (Comment) Diabetes: No     Diabetic?no  Interpreter Needed?: No      Activities of Daily Living In your present state of health, do you have any difficulty performing the following activities: 05/04/2021  Hearing? N  Vision? N  Difficulty concentrating or making decisions? N  Walking or climbing stairs? N  Dressing or bathing? N  Doing errands, shopping? N  Some recent data might be hidden    Patient Care Team: Horald Pollen, MD as PCP - General (Internal Medicine) Marylynn Pearson, MD as Consulting Physician (Ophthalmology)  Indicate any recent Medical Services you may have received from other than [facility] providers in the past year (date may be approximate).     Assessment:   This is a routine wellness examination for [place].  Hearing/Vision screen No results found.  Dietary issues and exercise activities discussed:     Goals Addressed   None   Depression Screen PHQ 2/9 Scores 05/04/2021 01/16/2021 10/18/2020 09/20/2020 03/21/2020  02/16/2020 12/19/2019  PHQ - 2 Score 0 0 0 0 0 0 0  PHQ- 9 Score 0 - - - - - -    Fall Risk Fall Risk  05/04/2021 01/16/2021 10/18/2020 09/20/2020 08/28/2020  Falls in the past year? 0 1 0 0 0  Comment - - - - -  Number falls in past yr: 0 0 0 - 0  Injury with Fall? 0 0 0 - 0  Risk for fall due to : No Fall Risks No Fall Risks - - -  Follow up Falls evaluation completed Falls evaluation completed Falls evaluation completed Falls evaluation completed Falls evaluation completed    [place]:  Any stairs in or around the home? Yes  If so, are there any without handrails? No  Home free of loose throw rugs in walkways, pet beds, electrical cords, etc? Yes  Adequate lighting in your home to reduce risk of falls? Yes   ASSISTIVE DEVICES UTILIZED TO PREVENT FALLS:  Life alert? No  Use of a cane, walker or w/c? No  Grab bars in the bathroom? No  Shower chair or bench in shower? No  Elevated toilet seat or a handicapped toilet? Yes   TIMED UP AND GO:  Was the test performed? No .  Length of time to ambulate 10 feet: n/a sec.     Cognitive Function:     6CIT Screen 05/04/2021 10/25/2017  What Year? 0 points 0 points  What month? - 0 points  What time? 0 points 0 points  Count back from 20 0 points 0 points  Months in reverse 0 points 0 points  Repeat phrase 0 points 0 points  Total Score - 0    Immunizations Immunization History  Administered Date(s) Administered   Fluad Quad(high Dose 65+) 06/02/2019, 04/26/2021   Influenza Split 05/03/2013, 05/11/2015   Influenza, High Dose Seasonal PF 05/18/2018   Influenza,inj,Quad PF,6+ Mos 07/25/2014, 04/21/2016, 04/27/2017   Influenza-Unspecified 05/22/2020   PFIZER(Purple Top)SARS-COV-2 Vaccination 09/23/2019, 10/14/2019, 05/22/2020, 04/26/2021   Pneumococcal Conjugate-13 05/03/2013, 07/25/2014   Pneumococcal Polysaccharide-23 12/19/2019   Pneumococcal-Unspecified 05/03/2013   Td 08/17/2005   Tdap  06/02/2016   Zoster Recombinat (Shingrix) 01/15/2017, 06/17/2017, 07/21/2017   Zoster, Live 10/22/2015    TDAP status: Up to date  Flu Vaccine status: Up to date  Pneumococcal vaccine status: Due, Education has been provided regarding the importance of this vaccine. Advised may receive this vaccine at local pharmacy or Health Dept. Aware to provide a copy of the vaccination record if obtained from local pharmacy or Health Dept. Verbalized acceptance and understanding.  Covid-19 vaccine status: Completed vaccines  Qualifies for Shingles Vaccine? Yes   Zostavax completed No   Shingrix Completed?: Yes  Screening Tests Health Maintenance  Topic Date Due   TETANUS/TDAP  06/02/2026   INFLUENZA VACCINE  Completed   DEXA SCAN  Completed   COVID-19 Vaccine  Completed   Zoster Vaccines- Shingrix  Completed   HPV VACCINES  Aged Out   COLONOSCOPY (Pts 45-63yr Insurance coverage will need to be confirmed)  Discontinued    Health Maintenance  There are no preventive care reminders to display for this patient.   Colorectal cancer screening: Type of screening: Colonoscopy. Completed 10/16/2014. Repeat every never years  Mammogram: not completed  Bone Density status: Completed 08/20/2014. Results reflect: Bone density results: OSTEOPOROSIS. Repeat every none years.  Lung Cancer Screening: (Low Dose CT Chest recommended if Age 82-80years, 30 pack-year currently smoking OR have quit w/in 15years.) does qualify.   Lung Cancer Screening Referral: n/a  Additional Screening:  Hepatitis C Screening: does qualify; Completed never  Vision Screening: Recommended annual ophthalmology exams for early detection of glaucoma and other disorders of the eye. Is the patient up to date with their annual eye exam?  Yes  Who is the provider or what is the name of the office in which the patient attends annual eye exams? Dr. WVenetia MaxonIf pt is not established with a provider, would they like to be referred  to a provider to establish care? No .   Dental Screening: Recommended annual dental exams for proper oral hygiene  Community Resource Referral / Chronic Care Management: CRR required this visit?  No   CCM required this visit?  No      Plan:     I have personally reviewed and noted the following in the patient's chart:   Medical and social history Use of alcohol, tobacco or illicit drugs  Current medications and supplements including opioid prescriptions. Patient is not currently taking opioid prescriptions. Functional ability and status Nutritional status Physical activity Advanced directives List of other physicians Hospitalizations, surgeries, and ER visits in previous 12 months Vitals Screenings to include cognitive, depression, and falls Referrals and appointments  In addition, I have reviewed and discussed with patient certain preventive protocols, quality metrics, and best practice recommendations. A written personalized care plan for preventive services as well as general preventive health recommendations were provided to patient.     Ival Bible Blonnie Maske, CMA   05/06/2021   Nurse Notes: Non Face to Face 40 min visit   Ms. Fenning , Thank you for taking time to come for your Medicare Wellness Visit. I appreciate your ongoing commitment to your health goals. Please review the following plan we discussed and let me know if I can assist you in the future.   These are the goals we discussed:  Goals   None     This is a list of the screening recommended for you and due dates:  Health Maintenance  Topic Date Due   Tetanus Vaccine  06/02/2026   Flu Shot  Completed   DEXA scan (bone density measurement)  Completed   COVID-19 Vaccine  Completed   Zoster (Shingles) Vaccine  Completed   HPV Vaccine  Aged Out   Colon Cancer Screening  Discontinued

## 2021-05-04 NOTE — Patient Instructions (Signed)
Health Maintenance, Female Adopting a healthy lifestyle and getting preventive care are important in promoting health and wellness. Ask your health care provider about: The right schedule for you to have regular tests and exams. Things you can do on your own to prevent diseases and keep yourself healthy. What should I know about diet, weight, and exercise? Eat a healthy diet  Eat a diet that includes plenty of vegetables, fruits, low-fat dairy products, and lean protein. Do not eat a lot of foods that are high in solid fats, added sugars, or sodium. Maintain a healthy weight Body mass index (BMI) is used to identify weight problems. It estimates body fat based on height and weight. Your health care provider can help determine your BMI and help you achieve or maintain a healthy weight. Get regular exercise Get regular exercise. This is one of the most important things you can do for your health. Most adults should: Exercise for at least 150 minutes each week. The exercise should increase your heart rate and make you sweat (moderate-intensity exercise). Do strengthening exercises at least twice a week. This is in addition to the moderate-intensity exercise. Spend less time sitting. Even light physical activity can be beneficial. Watch cholesterol and blood lipids Have your blood tested for lipids and cholesterol at 82 years of age, then have this test every 5 years. Have your cholesterol levels checked more often if: Your lipid or cholesterol levels are high. You are older than 82 years of age. You are at high risk for heart disease. What should I know about cancer screening? Depending on your health history and family history, you may need to have cancer screening at various ages. This may include screening for: Breast cancer. Cervical cancer. Colorectal cancer. Skin cancer. Lung cancer. What should I know about heart disease, diabetes, and high blood pressure? Blood pressure and heart  disease High blood pressure causes heart disease and increases the risk of stroke. This is more likely to develop in people who have high blood pressure readings, are of African descent, or are overweight. Have your blood pressure checked: Every 3-5 years if you are 18-39 years of age. Every year if you are 40 years old or older. Diabetes Have regular diabetes screenings. This checks your fasting blood sugar level. Have the screening done: Once every three years after age 40 if you are at a normal weight and have a low risk for diabetes. More often and at a younger age if you are overweight or have a high risk for diabetes. What should I know about preventing infection? Hepatitis B If you have a higher risk for hepatitis B, you should be screened for this virus. Talk with your health care provider to find out if you are at risk for hepatitis B infection. Hepatitis C Testing is recommended for: Everyone born from 1945 through 1965. Anyone with known risk factors for hepatitis C. Sexually transmitted infections (STIs) Get screened for STIs, including gonorrhea and chlamydia, if: You are sexually active and are younger than 82 years of age. You are older than 82 years of age and your health care provider tells you that you are at risk for this type of infection. Your sexual activity has changed since you were last screened, and you are at increased risk for chlamydia or gonorrhea. Ask your health care provider if you are at risk. Ask your health care provider about whether you are at high risk for HIV. Your health care provider may recommend a prescription medicine   to help prevent HIV infection. If you choose to take medicine to prevent HIV, you should first get tested for HIV. You should then be tested every 3 months for as long as you are taking the medicine. Pregnancy If you are about to stop having your period (premenopausal) and you may become pregnant, seek counseling before you get  pregnant. Take 400 to 800 micrograms (mcg) of folic acid every day if you become pregnant. Ask for birth control (contraception) if you want to prevent pregnancy. Osteoporosis and menopause Osteoporosis is a disease in which the bones lose minerals and strength with aging. This can result in bone fractures. If you are 65 years old or older, or if you are at risk for osteoporosis and fractures, ask your health care provider if you should: Be screened for bone loss. Take a calcium or vitamin D supplement to lower your risk of fractures. Be given hormone replacement therapy (HRT) to treat symptoms of menopause. Follow these instructions at home: Lifestyle Do not use any products that contain nicotine or tobacco, such as cigarettes, e-cigarettes, and chewing tobacco. If you need help quitting, ask your health care provider. Do not use street drugs. Do not share needles. Ask your health care provider for help if you need support or information about quitting drugs. Alcohol use Do not drink alcohol if: Your health care provider tells you not to drink. You are pregnant, may be pregnant, or are planning to become pregnant. If you drink alcohol: Limit how much you use to 0-1 drink a day. Limit intake if you are breastfeeding. Be aware of how much alcohol is in your drink. In the [place] one drink equals one 12 oz bottle of beer (355 mL), one 5 oz glass of wine (148 mL), or one 1 oz glass of hard liquor (44 mL). General instructions Schedule regular health, dental, and eye exams. Stay current with your vaccines. Tell your health care provider if: You often feel depressed. You have ever been abused or do not feel safe at home. Summary Adopting a healthy lifestyle and getting preventive care are important in promoting health and wellness. Follow your health care provider's instructions about healthy diet, exercising, and getting tested or screened for diseases. Follow your health care provider's  instructions on monitoring your cholesterol and blood pressure. This information is not intended to replace advice given to you by your health care provider. Make sure you discuss any questions you have with your health care provider. Document Revised: 10/11/2020 Document Reviewed: 07/27/2018 Elsevier Patient Education  2022 Elsevier Inc.  

## 2021-05-29 DIAGNOSIS — H04123 Dry eye syndrome of bilateral lacrimal glands: Secondary | ICD-10-CM | POA: Diagnosis not present

## 2021-05-29 DIAGNOSIS — H401332 Pigmentary glaucoma, bilateral, moderate stage: Secondary | ICD-10-CM | POA: Diagnosis not present

## 2021-06-02 ENCOUNTER — Ambulatory Visit (INDEPENDENT_AMBULATORY_CARE_PROVIDER_SITE_OTHER): Payer: Medicare Other | Admitting: Orthopaedic Surgery

## 2021-06-02 DIAGNOSIS — M25562 Pain in left knee: Secondary | ICD-10-CM | POA: Diagnosis not present

## 2021-06-02 DIAGNOSIS — M1711 Unilateral primary osteoarthritis, right knee: Secondary | ICD-10-CM

## 2021-06-02 DIAGNOSIS — M25561 Pain in right knee: Secondary | ICD-10-CM

## 2021-06-02 DIAGNOSIS — M1712 Unilateral primary osteoarthritis, left knee: Secondary | ICD-10-CM

## 2021-06-02 DIAGNOSIS — G8929 Other chronic pain: Secondary | ICD-10-CM | POA: Diagnosis not present

## 2021-06-02 MED ORDER — LIDOCAINE HCL 1 % IJ SOLN
3.0000 mL | INTRAMUSCULAR | Status: AC | PRN
  Administered 2021-06-02: 3 mL

## 2021-06-02 MED ORDER — METHYLPREDNISOLONE ACETATE 40 MG/ML IJ SUSP
40.0000 mg | INTRAMUSCULAR | Status: AC | PRN
  Administered 2021-06-02: 40 mg via INTRA_ARTICULAR

## 2021-06-02 NOTE — Progress Notes (Signed)
Office Visit Note   Patient: Tammy Mccullough           Date of Birth: [birth year]           MRN: [id number] Visit Date: 06/02/2021              Requested by: Horald Pollen, [place],   [id number] PCP: Horald Pollen, MD   Assessment & Plan: Visit Diagnoses:  1. Chronic pain of left knee   2. Chronic pain of right knee   3. Primary osteoarthritis of right knee   4. Unilateral primary osteoarthritis, left knee     Plan: Per the patient's request I did place a steroid injection in both knees which she tolerated well.  She understands the risk and benefits of injections.  Follow-up can be as needed.  She knows to wait at least 3 to 4 months between injections minimal.  Follow-Up Instructions: Return if symptoms worsen or fail to improve.   Orders:  Orders Placed This Encounter  Procedures   Large Joint Inj   Large Joint Inj    No orders of the defined types were placed in this encounter.     Procedures: Large Joint Inj: R knee on 06/02/2021 9:53 AM Indications: diagnostic evaluation and pain Details: 22 G 1.5 in needle, superolateral approach  Arthrogram: No  Medications: 3 mL lidocaine 1 %; 40 mg methylPREDNISolone acetate 40 MG/ML Outcome: tolerated well, no immediate complications Procedure, treatment alternatives, risks and benefits explained, specific risks discussed. Consent was given by the patient. Immediately prior to procedure a time out was called to verify the correct patient, procedure, equipment, support staff and site/side marked as required. Patient was prepped and draped in the usual sterile fashion.    Large Joint Inj: L knee on 06/02/2021 9:53 AM Indications: diagnostic evaluation and pain Details: 22 G 1.5 in needle, superolateral approach  Arthrogram: No  Medications: 3 mL lidocaine 1 %; 40 mg methylPREDNISolone acetate 40 MG/ML Outcome: tolerated well, no immediate complications Procedure, treatment  alternatives, risks and benefits explained, specific risks discussed. Consent was given by the patient. Immediately prior to procedure a time out was called to verify the correct patient, procedure, equipment, support staff and site/side marked as required. Patient was prepped and draped in the usual sterile fashion.      Clinical Data: No additional findings.   Subjective: Chief Complaint  Patient presents with   Left Knee - Pain   Right Knee - Pain  The patient is an 80 year old female who comes in today requesting steroid injections in both her knees to treat the pain from osteoarthritis.  She has had injections like this before and the last time was about 4-1/2 months ago.  She is not a diabetic and has had no acute change in her medical status.  She is not interested in any type of joint replacement surgery either.  HPI  Review of Systems There is currently listed no headache, chest pain, shortness of breath, fever, chills, nausea, vomiting  Objective: Vital Signs: There were no vitals taken for this visit.  Physical Exam She is alert and orient x3 and in no acute distress Ortho Exam Examination of both knees shows a large soft tissue envelope from her morbid obesity.  Both knees have good range of motion and the patellas track just slightly laterally.  There is slight varus malalignment of both knees. Specialty Comments:  No specialty comments available.  Imaging: No results found.  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   Cataract     Glaucoma    Glucose intolerance (impaired glucose tolerance)    Hypertension    Renal insufficiency     Family History  Problem Relation Age of Onset   Diabetes Mother    Heart disease Mother        unsure   COPD Father    Diabetes Sister    Hypertension Sister    Kidney disease Sister    Diabetes Brother    Diabetes Brother    Colon cancer Neg Hx    Rectal cancer Neg Hx    Stomach cancer Neg Hx     Past Surgical History:  Procedure Laterality Date   CATARACT EXTRACTION Right 07-2014   CATARACT EXTRACTION Left 01/16/2016   COLONOSCOPY     10 +yrs ago in [place], normal per pt   VAGINAL DELIVERY     x2   Social History   Occupational History   Occupation: retired  Tobacco Use   Smoking status: Never   Smokeless tobacco: Never  Substance and Sexual Activity   Alcohol use: Yes    Alcohol/week: 6.0 standard drinks    Types: 6 Standard drinks or equivalent per week    Comment: 2-3   Drug use: No   Sexual activity: Not Currently

## 2021-06-05 ENCOUNTER — Ambulatory Visit: Payer: Medicare Other | Admitting: Sports Medicine

## 2021-07-15 ENCOUNTER — Telehealth: Payer: Self-pay | Admitting: Emergency Medicine

## 2021-07-15 NOTE — Telephone Encounter (Signed)
Take over-the-counter Advil dual action with lidocaine patches.  Thanks.

## 2021-07-15 NOTE — Telephone Encounter (Signed)
Patient states she having left side and back pain  Patient states she has taking tylenol and aleve w/ little relief  Patient is requesting a call back to discuss otc medication for symptoms, patient has an upcoming ov on 12-5

## 2021-07-17 NOTE — Telephone Encounter (Signed)
Called and left vm with recommendations.

## 2021-07-21 ENCOUNTER — Ambulatory Visit (INDEPENDENT_AMBULATORY_CARE_PROVIDER_SITE_OTHER): Payer: Medicare Other | Admitting: Emergency Medicine

## 2021-07-21 ENCOUNTER — Other Ambulatory Visit: Payer: Self-pay

## 2021-07-21 ENCOUNTER — Encounter: Payer: Self-pay | Admitting: Emergency Medicine

## 2021-07-21 VITALS — BP 132/62 | HR 86 | Ht 64.0 in | Wt 224.0 lb

## 2021-07-21 DIAGNOSIS — Z8739 Personal history of other diseases of the musculoskeletal system and connective tissue: Secondary | ICD-10-CM | POA: Diagnosis not present

## 2021-07-21 DIAGNOSIS — I10 Essential (primary) hypertension: Secondary | ICD-10-CM

## 2021-07-21 DIAGNOSIS — N1832 Chronic kidney disease, stage 3b: Secondary | ICD-10-CM

## 2021-07-21 MED ORDER — OLMESARTAN MEDOXOMIL-HCTZ 40-25 MG PO TABS: 1.0000 | ORAL_TABLET | Freq: Every day | ORAL | 3 refills | Status: DC

## 2021-07-21 MED ORDER — AMLODIPINE BESYLATE 5 MG PO TABS: 5.0000 mg | ORAL_TABLET | Freq: Every day | ORAL | 3 refills | Status: DC

## 2021-07-21 NOTE — Assessment & Plan Note (Signed)
Stable.  Advised to stay well-hydrated and avoid NSAIDs if at all possible.

## 2021-07-21 NOTE — Patient Instructions (Signed)

## 2021-07-21 NOTE — Progress Notes (Signed)
Tammy Mccullough 80 y.o.   Chief Complaint  Patient presents with   Hypertension    6 month f/u    HISTORY OF PRESENT ILLNESS: This is a 80 y.o. female here for 41-month follow-up of hypertension. Presently on amlodipine 5 mg daily and olmesartan-HCTZ 40-25 mg daily. Has no complaints or medical concerns today Blood work normal on February 2022 unremarkable with acceptable levels. Had normal hemoglobin A1c.  Baseline CKD stage IIIb.  Unremarkable lipid profile. BP Readings from Last 3 Encounters:  01/16/21 138/76  10/18/20 (!) 147/77  09/20/20 136/70     Hypertension Pertinent negatives include no chest pain, headaches, palpitations or shortness of breath.    Prior to Admission medications   Medication Sig Start Date End Date Taking? Authorizing Provider  acetaminophen (TYLENOL) 500 MG tablet Take 500 mg by mouth every 8 (eight) hours as needed.    [provider]  amLODipine (NORVASC) 5 MG tablet Take 1 tablet (5 mg total) by mouth daily. 08/28/20   Wendie Agreste, MD  diclofenac Sodium (VOLTAREN) 1 % GEL Apply 2 g topically 4 (four) times daily. 08/28/20   Wendie Agreste, MD  olmesartan-hydrochlorothiazide (BENICAR HCT) 40-25 MG tablet Take 1 tablet by mouth daily. 08/28/20   Wendie Agreste, MD  Polyethyl Glycol-Propyl Glycol (SYSTANE HYDRATION PF OP) Apply to eye. 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   Cataract     Glaucoma    Glucose intolerance (impaired glucose tolerance)    Hypertension    Renal insufficiency     Past Surgical History:  Procedure Laterality Date   CATARACT EXTRACTION Right 07-2014   CATARACT EXTRACTION Left 01/16/2016   COLONOSCOPY     10 +yrs ago in [place], normal per pt   VAGINAL DELIVERY     x2    Social History   Socioeconomic History   Marital status: Widowed    Spouse name: Not on file   Number of children: 2   Years of education: Not on file   Highest education level: Not on file  Occupational History   Occupation: retired  Tobacco Use   Smoking status: Never   Smokeless tobacco: Never  Substance and Sexual Activity   Alcohol use: Yes    Alcohol/week: 6.0 standard drinks    Types: 6 Standard drinks or equivalent per week    Comment: 2-3   Drug use: No   Sexual activity: Not Currently  Other Topics Concern   Not on file  Social History Narrative   Marital status: widowed since 2010 after 15 years of marriage.  Not dating but would like to be.      Children:  2 Children; no grandchildren; 2 adopted grandchildren.  One son is disabled; patient supports; son is obese.      Living: alone with 1 cat.      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      Tobacco: never      Alcohol: weekends liquor; some during the week.  3-5 cups per week.      Drugs: none      Exercise: water aerobics three days per week.  [employer].      Seatbelt: 100%;      Guns: none      ADLs:: independent with ADLs.   Drives. No assistant devices.      Advanced Directives: yes; healthcare POA:  Older brother [name]);  FULL CODE but no prolonged measures.     Social Determinants of Health   Financial Resource Strain: Not on file  Food Insecurity: Not on file  Transportation Needs: Not on file  Physical Activity: Not on file  Stress: Not on file  Social Connections: Not on file  Intimate Partner Violence:  Not on file    Family History  Problem Relation Age of Onset   Diabetes Mother    Heart disease Mother        unsure   COPD Father    Diabetes Sister    Hypertension Sister    Kidney disease Sister    Diabetes Brother    Diabetes Brother    Colon cancer Neg Hx    Rectal cancer Neg Hx    Stomach cancer Neg Hx      Review of Systems  Constitutional: Negative.  Negative for chills and fever.  HENT: Negative.  Negative for congestion and sore throat.   Respiratory: Negative.  Negative for cough and shortness of breath.   Cardiovascular: Negative.  Negative for chest pain and palpitations.  Gastrointestinal:  Negative for abdominal pain, diarrhea, nausea and vomiting.  Genitourinary: Negative.   Musculoskeletal:  Positive for joint pain.  Skin: Negative.  Negative for rash.  Neurological: Negative.  Negative for dizziness and headaches.  All other systems reviewed and are negative.   Physical Exam Vitals reviewed.  Constitutional:      Appearance: Normal appearance.  HENT:     Head: Normocephalic.  Eyes:     Extraocular Movements: Extraocular movements intact.     Conjunctiva/sclera: Conjunctivae normal.     Pupils: Pupils are equal, round, and reactive to light.  Cardiovascular:     Rate and Rhythm: Normal rate and regular rhythm.     Pulses: Normal pulses.     Heart sounds: Normal heart sounds.  Pulmonary:     Effort: Pulmonary effort is normal.     Breath sounds: Normal breath sounds.  Abdominal:     General: There is no distension.     Palpations: Abdomen is soft.     Tenderness: There is no abdominal tenderness.  Musculoskeletal:        General: Normal range of motion.     Cervical back: Normal range of motion and neck supple.  Skin:    General: Skin is warm and dry.     Capillary Refill: Capillary refill takes less than 2 seconds.  Neurological:     General: No focal deficit present.     Mental Status: She is alert and oriented to person, place, and  time.  Psychiatric:        Mood and Affect: Mood normal.        Behavior: Behavior normal.     ASSESSMENT & PLAN: A total of 33 minutes was spent with the patient and counseling/coordination of care regarding preparing for this visit, review  of most recent office visit notes, review of most recent blood work results, review of all medications and refills needed, education on nutrition, osteoarthritis management and advice on how to use NSAIDs given her CKD condition, dietary approaches to stop hypertension, prognosis, documentation and need for follow-up.  Problem List Items Addressed This Visit       Cardiovascular and Mediastinum   HTN (hypertension)    Well-controlled hypertension. BP Readings from Last 3 Encounters:  07/21/21 132/62  01/16/21 138/76  10/18/20 (!) 147/77  Continue Benicar HCTZ 40-25 mg daily and amlodipine 5 mg daily. Dietary approaches to stop hypertension discussed.       Relevant Medications   olmesartan-hydrochlorothiazide (BENICAR HCT) 40-25 MG tablet   amLODipine (NORVASC) 5 MG tablet     Genitourinary   Chronic renal insufficiency    Stable.  Advised to stay well-hydrated and avoid NSAIDs if at all possible.        Other   History of osteoarthritis    Stable.  Sees orthopedist on a regular basis. Takes Tylenol and NSAIDs as needed. Advised to use lidocaine patches as needed as well.      Other Visit Diagnoses     Essential hypertension    -  Primary   Relevant Medications   olmesartan-hydrochlorothiazide (BENICAR HCT) 40-25 MG tablet   amLODipine (NORVASC) 5 MG tablet   Stage 3b chronic kidney disease ([place])          Patient Instructions  Hypertension, Adult High blood pressure (hypertension) is when the force of blood pumping through the arteries is too strong. The arteries are the blood vessels that carry blood from the heart throughout the body. Hypertension forces the heart to work harder to pump blood and may cause arteries to  become narrow or stiff. Untreated or uncontrolled hypertension can cause a heart attack, heart failure, a stroke, kidney disease, and other problems. A blood pressure reading consists of a higher number over a lower number. Ideally, your blood pressure should be below 120/80. The first ("top") number is called the systolic pressure. It is a measure of the pressure in your arteries as your heart beats. The second ("bottom") number is called the diastolic pressure. It is a measure of the pressure in your arteries as the heart relaxes. What are the causes? The exact cause of this condition is not known. There are some conditions that result in or are related to high blood pressure. What increases the risk? Some risk factors for high blood pressure are under your control. The following factors may make you more likely to develop this condition: Smoking. Having type 2 diabetes mellitus, high cholesterol, or both. Not getting enough exercise or physical activity. Being overweight. Having too much fat, sugar, calories, or salt (sodium) in your diet. Drinking too much alcohol. Some risk factors for high blood pressure may be difficult or impossible to change. Some of these factors include: Having chronic kidney disease. Having a family history of high blood pressure. Age. Risk increases with age. Race. You may be at higher risk if you are African American. Gender. Men are at higher risk than women before age 56. After age 30, women are at higher risk than men. Having obstructive sleep apnea. Stress. What are the signs or symptoms? High blood pressure may not cause symptoms. Very high blood pressure (hypertensive crisis) may cause: Headache. Anxiety. Shortness of breath. Nosebleed. Nausea and vomiting. Vision changes. Severe chest pain. Seizures. How is this diagnosed? This condition is diagnosed by  measuring your blood pressure while you are seated, with your arm resting on a flat surface,  your legs uncrossed, and your feet flat on the floor. The cuff of the blood pressure monitor will be placed directly against the skin of your upper arm at the level of your heart. It should be measured at least twice using the same arm. Certain conditions can cause a difference in blood pressure between your right and left arms. Certain factors can cause blood pressure readings to be lower or higher than normal for a short period of time: When your blood pressure is higher when you are in a health care provider's office than when you are at home, this is called white coat hypertension. Most people with this condition do not need medicines. When your blood pressure is higher at home than when you are in a health care provider's office, this is called masked hypertension. Most people with this condition may need medicines to control blood pressure. If you have a high blood pressure reading during one visit or you have normal blood pressure with other risk factors, you may be asked to: Return on a different day to have your blood pressure checked again. Monitor your blood pressure at home for 1 week or longer. If you are diagnosed with hypertension, you may have other blood or imaging tests to help your health care provider understand your overall risk for other conditions. How is this treated? This condition is treated by making healthy lifestyle changes, such as eating healthy foods, exercising more, and reducing your alcohol intake. Your health care provider may prescribe medicine if lifestyle changes are not enough to get your blood pressure under control, and if: Your systolic blood pressure is above 130. Your diastolic blood pressure is above 80. Your personal target blood pressure may vary depending on your medical conditions, your age, and other factors. Follow these instructions at home: Eating and drinking  Eat a diet that is high in fiber and potassium, and low in sodium, added sugar, and  fat. An example eating plan is called the DASH (Dietary Approaches to Stop Hypertension) diet. To eat this way: Eat plenty of fresh fruits and vegetables. Try to fill one half of your plate at each meal with fruits and vegetables. Eat whole grains, such as whole-wheat pasta, brown rice, or whole-grain bread. Fill about one fourth of your plate with whole grains. Eat or drink low-fat dairy products, such as skim milk or low-fat yogurt. Avoid fatty cuts of meat, processed or cured meats, and poultry with skin. Fill about one fourth of your plate with lean proteins, such as fish, chicken without skin, beans, eggs, or tofu. Avoid pre-made and processed foods. These tend to be higher in sodium, added sugar, and fat. Reduce your daily sodium intake. Most people with hypertension should eat less than 1,500 mg of sodium a day. Do not drink alcohol if: Your health care provider tells you not to drink. You are pregnant, may be pregnant, or are planning to become pregnant. If you drink alcohol: Limit how much you use to: 0-1 drink a day for women. 0-2 drinks a day for men. Be aware of how much alcohol is in your drink. In the [place] one drink equals one 12 oz bottle of beer (355 mL), one 5 oz glass of wine (148 mL), or one 1 oz glass of hard liquor (44 mL). Lifestyle  Work with your health care provider to maintain a healthy body weight or to lose  weight. Ask what an ideal weight is for you. Get at least 30 minutes of exercise most days of the week. Activities may include walking, swimming, or biking. Include exercise to strengthen your muscles (resistance exercise), such as Pilates or lifting weights, as part of your weekly exercise routine. Try to do these types of exercises for 30 minutes at least 3 days a week. Do not use any products that contain nicotine or tobacco, such as cigarettes, e-cigarettes, and chewing tobacco. If you need help quitting, ask your health care provider. Monitor your blood  pressure at home as told by your health care provider. Keep all follow-up visits as told by your health care provider. This is important. Medicines Take over-the-counter and prescription medicines only as told by your health care provider. Follow directions carefully. Blood pressure medicines must be taken as prescribed. Do not skip doses of blood pressure medicine. Doing this puts you at risk for problems and can make the medicine less effective. Ask your health care provider about side effects or reactions to medicines that you should watch for. Contact a health care provider if you: Think you are having a reaction to a medicine you are taking. Have headaches that keep coming back (recurring). Feel dizzy. Have swelling in your ankles. Have trouble with your vision. Get help right away if you: Develop a severe headache or confusion. Have unusual weakness or numbness. Feel faint. Have severe pain in your chest or abdomen. Vomit repeatedly. Have trouble breathing. Summary Hypertension is when the force of blood pumping through your arteries is too strong. If this condition is not controlled, it may put you at risk for serious complications. Your personal target blood pressure may vary depending on your medical conditions, your age, and other factors. For most people, a normal blood pressure is less than 120/80. Hypertension is treated with lifestyle changes, medicines, or a combination of both. Lifestyle changes include losing weight, eating a healthy, low-sodium diet, exercising more, and limiting alcohol. This information is not intended to replace advice given to you by your health care provider. Make sure you discuss any questions you have with your health care provider. Document Revised: 04/13/2018 Document Reviewed: 04/13/2018 Elsevier Patient Education  2022 [place], MD [facility]

## 2021-07-21 NOTE — Assessment & Plan Note (Signed)
Stable.  Sees orthopedist on a regular basis. Takes Tylenol and NSAIDs as needed. Advised to use lidocaine patches as needed as well.

## 2021-07-21 NOTE — Assessment & Plan Note (Signed)
Well-controlled hypertension. BP Readings from Last 3 Encounters:  07/21/21 132/62  01/16/21 138/76  10/18/20 (!) 147/77  Continue Benicar HCTZ 40-25 mg daily and amlodipine 5 mg daily. Dietary approaches to stop hypertension discussed.

## 2021-09-25 DIAGNOSIS — H401332 Pigmentary glaucoma, bilateral, moderate stage: Secondary | ICD-10-CM | POA: Diagnosis not present

## 2021-09-25 DIAGNOSIS — H04123 Dry eye syndrome of bilateral lacrimal glands: Secondary | ICD-10-CM | POA: Diagnosis not present

## 2021-09-29 ENCOUNTER — Ambulatory Visit (INDEPENDENT_AMBULATORY_CARE_PROVIDER_SITE_OTHER): Payer: Medicare Other | Admitting: Physician Assistant

## 2021-09-29 ENCOUNTER — Other Ambulatory Visit: Payer: Self-pay

## 2021-09-29 ENCOUNTER — Encounter: Payer: Self-pay | Admitting: Physician Assistant

## 2021-09-29 DIAGNOSIS — M1711 Unilateral primary osteoarthritis, right knee: Secondary | ICD-10-CM

## 2021-09-29 DIAGNOSIS — M1712 Unilateral primary osteoarthritis, left knee: Secondary | ICD-10-CM | POA: Diagnosis not present

## 2021-09-29 MED ORDER — METHYLPREDNISOLONE ACETATE 40 MG/ML IJ SUSP
40.0000 mg | INTRAMUSCULAR | Status: AC | PRN
  Administered 2021-09-29: 40 mg via INTRA_ARTICULAR

## 2021-09-29 MED ORDER — LIDOCAINE HCL 1 % IJ SOLN
3.0000 mL | INTRAMUSCULAR | Status: AC | PRN
  Administered 2021-09-29: 3 mL

## 2021-09-29 NOTE — Progress Notes (Signed)
° °  Procedure Note  Patient: Tammy Mccullough             Date of Birth: 06/30/1941           MRN: [id number]             Visit Date: 09/29/2021  HPI: Tammy Mccullough comes in today due to bilateral knee pain.  She states she is wanting injections both knees.  She was last seen 06/02/2021 and was given cortisone injections both knees.  She states that both knees are bothering her she denies any injury, mechanical symptoms or swelling of either knee.  She states the last injections worked until about a week ago.  She is nondiabetic.  Review of systems: No fevers chills.  Please see HPI  Physical exam: General: Well-developed well-nourished female no acute distress Bilateral knees no abnormal warmth erythema or effusion.  Left knee with tenderness medial lateral joint line.  Left knee with patellofemoral crepitus.  Slight varus deformity both knees.  Procedures: Visit Diagnoses:  1. Primary osteoarthritis of right knee   2. Unilateral primary osteoarthritis, left knee     Large Joint Inj: bilateral knee on 09/29/2021 4:17 PM Indications: pain Details: 22 G 1.5 in needle, anterolateral approach  Arthrogram: No  Medications (Right): 3 mL lidocaine 1 %; 40 mg methylPREDNISolone acetate 40 MG/ML Medications (Left): 3 mL lidocaine 1 %; 40 mg methylPREDNISolone acetate 40 MG/ML Outcome: tolerated well, no immediate complications Procedure, treatment alternatives, risks and benefits explained, specific risks discussed. Consent was given by the patient. Immediately prior to procedure a time out was called to verify the correct patient, procedure, equipment, support staff and site/side marked as required. Patient was prepped and draped in the usual sterile fashion.    Plan: She will continue to work on strengthening both knees.  She knows to wait at least 3 months between injections.  Follow-up with [place] as needed.  Questions encouraged and answered

## 2021-09-30 ENCOUNTER — Telehealth: Payer: Self-pay | Admitting: Physician Assistant

## 2021-09-30 NOTE — Telephone Encounter (Signed)
Tammy Mccullough received bilateral knee injections yesterday. Last night her right knee/leg experienced increased pain and this morning she is still in some pain and has a limp. She would like a call back for instructions to handle pain. Please advise!

## 2021-09-30 NOTE — Telephone Encounter (Signed)
Pt called again.   [phone]

## 2021-09-30 NOTE — Telephone Encounter (Signed)
Patient aware just use ice, elevate and an anti-inflammatory

## 2021-11-27 ENCOUNTER — Ambulatory Visit: Payer: Medicare Other | Admitting: Sports Medicine

## 2021-11-27 DIAGNOSIS — H401332 Pigmentary glaucoma, bilateral, moderate stage: Secondary | ICD-10-CM | POA: Diagnosis not present

## 2021-11-27 DIAGNOSIS — H04123 Dry eye syndrome of bilateral lacrimal glands: Secondary | ICD-10-CM | POA: Diagnosis not present

## 2021-12-04 ENCOUNTER — Ambulatory Visit (INDEPENDENT_AMBULATORY_CARE_PROVIDER_SITE_OTHER): Payer: Medicare Other | Admitting: Sports Medicine

## 2021-12-04 DIAGNOSIS — L601 Onycholysis: Secondary | ICD-10-CM

## 2021-12-04 DIAGNOSIS — L603 Nail dystrophy: Secondary | ICD-10-CM

## 2021-12-04 DIAGNOSIS — R7302 Impaired glucose tolerance (oral): Secondary | ICD-10-CM

## 2021-12-04 DIAGNOSIS — L608 Other nail disorders: Secondary | ICD-10-CM | POA: Diagnosis not present

## 2021-12-04 DIAGNOSIS — I739 Peripheral vascular disease, unspecified: Secondary | ICD-10-CM | POA: Diagnosis not present

## 2021-12-04 NOTE — Patient Instructions (Signed)
Tea tree oil ?Apply to left great toenail twice per week after bath or shower as the nail is growing out ?

## 2021-12-04 NOTE — Progress Notes (Signed)
Subjective: ?Tammy Mccullough is a 81 y.o. female patient seen today in office with complaint of discoloration at the left big toenail. Patient states that she noticed that it has turned dark but not sure why, denies any known trauma. No other issues noted.  ? ?Patient Active Problem List  ? Diagnosis Date Noted  ? History of osteoarthritis 07/21/2021  ? Body mass index (BMI) of 40.0-44.9 in adult [facility]) 10/25/2017  ? 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 Glucose intolerance (impaired glucose tolerance) 04/21/2016  ? Chronic renal insufficiency 09/09/2015  ? Glaucoma 02/09/2013  ? HTN (hypertension) 02/09/2013  ? ? ?Current Outpatient Medications on File Prior to Visit  ?Medication Sig Dispense Refill  ? acetaminophen (TYLENOL) 500 MG tablet Take 500 mg by mouth every 8 (eight) hours as needed.    ? amLODipine (NORVASC) 5 MG tablet Take 1 tablet (5 mg total) by mouth daily. 90 tablet 3  ? diclofenac Sodium (VOLTAREN) 1 % GEL Apply 2 g topically 4 (four) times daily. 100 g 3  ? olmesartan-hydrochlorothiazide (BENICAR HCT) 40-25 MG tablet Take 1 tablet by mouth daily. 90 tablet 3  ? Polyethyl Glycol-Propyl Glycol (SYSTANE HYDRATION PF OP) Apply to eye. Dry eyes    ? ZIOPTAN 0.0015 % SOLN     ? ?No current facility-administered medications on file prior to visit.  ? ? ?No Known Allergies ? ?Objective: ?Physical Exam ? ?General: Well developed, nourished, no acute distress, awake, alert and oriented x 3 ? ?Vascular: Dorsalis pedis artery 1/4 bilateral, Posterior tibial artery 0/4 bilateral, skin temperature warm to warm proximal to distal bilateral lower extremities, moderate varicosities 1+ pitting edema bilateral ankles, no pedal hair present bilateral. ? ?Neurological: Gross sensation present via light touch bilateral.  ? ?Dermatological: Skin is warm, dry, and supple bilateral, Left hallux nail partially detached with dry  blood encompassing 80% of the nail, no active drainage or acute signs of infection noted ,no webspace macerations present bilateral, no open lesions present bilateral, no callus/corns/hyperkeratotic tissue present bilateral. ? ?Musculoskeletal: No pain to left hallux nail. Asymptomatic bunion on left and pes planus boney deformities noted bilateral. Muscular strength within normal limits without painon range of motion.  ? ?Assessment and Plan:  ?Problem List Items Addressed This Visit   ? ?  ? Endocrine  ? Glucose intolerance (impaired glucose tolerance)  ? ?Other Visit Diagnoses   ? ? Nail dystrophy    -  Primary  ? Nail hemorrhage      ? PVD (peripheral vascular disease) ([place])      ? Onycholysis      ? ?  ? ?-Examined patient.  ?-Discussed treatment options for lifting nail due to dry blood ?-Mechanically debrided and reduced left hallux nail with sterile nail nipper and dremel nail file without incident. ?-Advised patient that the dark discoloration is dry blood that will slowly grow out ?-May use tea tree oil to left hallux nail as it grows out as directed ?-Avoid shoes that could rub toes ?-Patient to return as needed or sooner if symptoms worsen. ? ?Landis Martins, DPM ? ?

## 2021-12-22 NOTE — Telephone Encounter (Signed)
NA

## 2021-12-23 ENCOUNTER — Ambulatory Visit
Admission: EM | Admit: 2021-12-23 | Discharge: 2021-12-23 | Disposition: A | Discharge status: Home / Self Care | Payer: Medicare Other | Attending: Urgent Care | Admitting: Urgent Care

## 2021-12-23 DIAGNOSIS — R0981 Nasal congestion: Secondary | ICD-10-CM | POA: Diagnosis not present

## 2021-12-23 DIAGNOSIS — J3489 Other specified disorders of nose and nasal sinuses: Secondary | ICD-10-CM | POA: Diagnosis not present

## 2021-12-23 DIAGNOSIS — R42 Dizziness and giddiness: Secondary | ICD-10-CM | POA: Diagnosis not present

## 2021-12-23 DIAGNOSIS — H6123 Impacted cerumen, bilateral: Secondary | ICD-10-CM | POA: Diagnosis not present

## 2021-12-23 DIAGNOSIS — H6983 Other specified disorders of Eustachian tube, bilateral: Secondary | ICD-10-CM | POA: Diagnosis not present

## 2021-12-23 LAB — POCT FASTING CBG KUC MANUAL ENTRY: POCT Glucose (KUC): 104 mg/dL — AB (ref 70–99)

## 2021-12-23 MED ORDER — FLUTICASONE PROPIONATE 50 MCG/ACT NA SUSP: 1.0000 | Freq: Two times a day (BID) | NASAL | 0 refills | Status: DC

## 2021-12-23 NOTE — ED Triage Notes (Signed)
Pt states this am she felt lightheaded and also having some nasal congestion. ?

## 2021-12-23 NOTE — ED Provider Notes (Signed)
?[facility] ? ? ? ?CSN: [id number] ?Arrival date & time: 12/23/21  1509 ? ? ?  ? ?History   ?Chief Complaint ?Chief Complaint  ?Patient presents with  ? Dizziness  ? ? ?HPI ?Tammy Mccullough is a 81 y.o. female.  ? ?Pleasant 81 year old female presents today due to an onset of "feeling off" starting this morning upon awakening.  She initially described as feeling lightheaded, but states that raising from a chair did not make her feel presyncopal.  She reports it is hard to describe, but did not feel like the room was spinning and did not feel faint, but rather just "unsteady". Checked her BP at home several times this morning ranging from 120-130/70-80. She states the symptoms have slightly improved. She reports chills starting this morning but denies fever. She also notes significant nasal congestion. She has bilateral eye tearing, but states that is normal for her as she uses Rx glaucoma eye drops. She denies any headache, slurred speech, weakness, abnormal gait or facial drooping. No tinnitus or hearing loss.  ? ? ?Dizziness ? ?Past Medical History:  ?Diagnosis Date  ? Arthritis   ? B knees.  ? Cataract   ? Glaucoma   ? Glucose intolerance (impaired glucose tolerance)   ? Hypertension   ? Renal insufficiency   ? ? ?Patient Active Problem List  ? Diagnosis Date Noted  ? History of osteoarthritis 07/21/2021  ? Body mass index (BMI) of 40.0-44.9 in adult [facility]) 10/25/2017  ? 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 Glucose intolerance (impaired glucose tolerance) 04/21/2016  ? Chronic renal insufficiency 09/09/2015  ? Glaucoma 02/09/2013  ? HTN (hypertension) 02/09/2013  ? ? ?Past Surgical History:  ?Procedure Laterality Date  ? CATARACT EXTRACTION Right 07-2014  ? CATARACT EXTRACTION Left 01/16/2016  ? COLONOSCOPY    ? 10 +yrs ago in [facility], normal per pt  ? VAGINAL DELIVERY    ? x2  ? ? ?OB History   ?No obstetric  history on file. ?  ? ? ? ?Home Medications   ? ?Prior to Admission medications   ?Medication Sig Start Date End Date Taking? Authorizing Provider  ?fluticasone (FLONASE) 50 MCG/ACT nasal spray Place 1 spray into both nostrils in the morning and at bedtime. 12/23/21  Yes Chrissa Meetze L, PA  ?acetaminophen (TYLENOL) 500 MG tablet Take 500 mg by mouth every 8 (eight) hours as needed.    [provider]  ?amLODipine (NORVASC) 5 MG tablet Take 1 tablet (5 mg total) by mouth daily. 07/21/21   Horald Pollen, MD  ?diclofenac Sodium (VOLTAREN) 1 % GEL Apply 2 g topically 4 (four) times daily. ?Patient not taking: Reported on 12/23/2021 08/28/20   Wendie Agreste, MD  ?olmesartan-hydrochlorothiazide (BENICAR HCT) 40-25 MG tablet Take 1 tablet by mouth daily. 07/21/21   Horald Pollen, MD  ?Polyethyl Glycol-Propyl Glycol (SYSTANE HYDRATION PF OP) Apply to eye. Dry eyes    [provider]  ?ZIOPTAN 0.0015 % SOLN  09/26/16   [provider]  ? ? ?Family History ?Family History  ?Problem Relation Age of Onset  ? Diabetes Mother   ? Heart disease Mother   ?     unsure  ? COPD Father   ? Diabetes Sister   ? Hypertension Sister   ? Kidney disease Sister   ? Diabetes Brother   ? Diabetes Brother   ?  Colon cancer Neg Hx   ? Rectal cancer Neg Hx   ? Stomach cancer Neg Hx   ? ? ?Social History ?Social History  ? ?Tobacco Use  ? Smoking status: Never  ? Smokeless tobacco: Never  ?Substance Use Topics  ? Alcohol use: Yes  ?  Alcohol/week: 6.0 standard drinks  ?  Types: 6 Standard drinks or equivalent per week  ?  Comment: 2-3  ? Drug use: No  ? ? ? ?Allergies   ?Patient has no known allergies. ? ? ?Review of Systems ?Review of Systems  ?Neurological:  Positive for dizziness.  ?As per hpi ? ?Physical Exam ?Triage Vital Signs ?ED Triage Vitals [12/23/21 1603]  ?Enc Vitals Group  ?   BP (!) 156/78  ?   Pulse Rate 96  ?   Resp 16  ?   Temp 97.9 ?F (36.6 ?C)  ?   Temp Source Oral  ?   SpO2 96 %  ?    Weight   ?   Height   ?   Head Circumference   ?   Peak Flow   ?   Pain Score 0  ?   Pain Loc   ?   Pain Edu?   ?   Excl. in [place]?   ? ?No data found. ? ?Updated Vital Signs ?BP (!) 156/78 (BP Location: Left Arm)   Pulse 96   Temp 97.9 ?F (36.6 ?C) (Oral)   Resp 16   SpO2 96%  ? ?Visual Acuity ?Right Eye Distance:   ?Left Eye Distance:   ?Bilateral Distance:   ? ?Right Eye Near:   ?Left Eye Near:    ?Bilateral Near:    ? ?Physical Exam ?Vitals and nursing note reviewed.  ?Constitutional:   ?   General: She is not in acute distress. ?   Appearance: Normal appearance. She is well-developed. She is obese. She is not ill-appearing, toxic-appearing or diaphoretic.  ?HENT:  ?   Head: Normocephalic and atraumatic.  ?   Right Ear: Ear canal and external ear normal. A middle ear effusion is present. There is impacted cerumen.  ?   Left Ear: Ear canal and external ear normal. A middle ear effusion is present. There is impacted cerumen.  ?   Nose: Rhinorrhea present.  ?   Mouth/Throat:  ?   Mouth: Mucous membranes are moist.  ?   Pharynx: No oropharyngeal exudate or posterior oropharyngeal erythema.  ?Eyes:  ?   Extraocular Movements: Extraocular movements intact.  ?   Conjunctiva/sclera: Conjunctivae normal.  ?   Pupils: Pupils are equal, round, and reactive to light.  ?Cardiovascular:  ?   Rate and Rhythm: Normal rate and regular rhythm.  ?   Heart sounds: No murmur heard. ?Pulmonary:  ?   Effort: Pulmonary effort is normal. No respiratory distress.  ?   Breath sounds: Normal breath sounds.  ?Abdominal:  ?   Palpations: Abdomen is soft.  ?   Tenderness: There is no abdominal tenderness.  ?Musculoskeletal:     ?   General: No swelling.  ?   Cervical back: Normal range of motion and neck supple. No rigidity or tenderness.  ?Lymphadenopathy:  ?   Cervical: No cervical adenopathy.  ?Skin: ?   General: Skin is warm and dry.  ?   Capillary Refill: Capillary refill takes less than 2 seconds.  ?Neurological:  ?   General: No focal  deficit present.  ?   Mental Status: She is alert  and oriented to person, place, and time. Mental status is at baseline.  ?   Cranial Nerves: Cranial nerves 2-12 are intact. No cranial nerve deficit or facial asymmetry.  ?   Sensory: Sensation is intact. No sensory deficit.  ?   Motor: Motor function is intact. No weakness, tremor, atrophy or pronator drift.  ?   Coordination: Coordination is intact. Romberg sign negative. Coordination normal. Finger-Nose-Finger Test normal.  ?   Gait: Gait is intact. Gait normal.  ?   Deep Tendon Reflexes: Reflexes are normal and symmetric. Reflexes normal.  ?Psychiatric:     ?   Mood and Affect: Mood normal.  ? ? ? ?UC Treatments / Results  ?Labs ?(all labs ordered are listed, but only abnormal results are displayed) ?Labs Reviewed  ?POCT FASTING [facility] MANUAL ENTRY - Abnormal; Notable for the following components:  ?    Result Value  ? POCT Glucose ([facility]) 104 (*)   ? All other components within normal limits  ? ? ?EKG ? ? ?Radiology ?No results found. ? ?Procedures ?Ear Cerumen Removal ? ?Date/Time: 12/23/2021 5:01 PM ?Performed by: Chaney Malling, PA ?Authorized by: Chaney Malling, PA  ? ?Consent:  ?  Consent obtained:  Verbal ?  Consent given by:  Patient ?  Risks, benefits, and alternatives were discussed: yes   ?  Risks discussed:  Bleeding, infection, dizziness, incomplete removal and TM perforation ?  Alternatives discussed:  No treatment and alternative treatment ?Universal protocol:  ?  Procedure explained and questions answered to patient or proxy's satisfaction: yes   ?  Patient identity confirmed:  Verbally with patient ?Procedure details:  ?  Location:  R ear and L ear ?  Procedure type: irrigation   ?  Procedure outcomes: cerumen removed   ?Post-procedure details:  ?  Inspection:  Ear canal clear ?  Hearing quality:  Normal ?  Procedure completion:  Tolerated (including critical care time) ? ?Medications Ordered in UC ?Medications - No data to display ? ?Initial  Impression / Assessment and Plan / UC Course  ?I have reviewed the triage vital signs and the nursing notes. ? ?Pertinent labs & imaging results that were available during my care of the patient were reviewed by me a

## 2021-12-23 NOTE — Discharge Instructions (Signed)
Your symptoms are likely related to eustachian tube dysfunction from your nasal congestion.  This can give you an off-balance or dizzy sensation. ?Please start using the Flonase 1 spray in each nostril twice daily. ?If any of your symptoms worsen, please had to the emergency room. ?Your blood sugar was normal. ?Your ears were cleaned out in office. ?

## 2022-01-14 ENCOUNTER — Encounter: Payer: Self-pay | Admitting: Physician Assistant

## 2022-01-14 ENCOUNTER — Ambulatory Visit (INDEPENDENT_AMBULATORY_CARE_PROVIDER_SITE_OTHER): Payer: Medicare Other | Admitting: Physician Assistant

## 2022-01-14 DIAGNOSIS — M1711 Unilateral primary osteoarthritis, right knee: Secondary | ICD-10-CM | POA: Diagnosis not present

## 2022-01-14 DIAGNOSIS — M1712 Unilateral primary osteoarthritis, left knee: Secondary | ICD-10-CM | POA: Diagnosis not present

## 2022-01-14 MED ORDER — METHYLPREDNISOLONE ACETATE 40 MG/ML IJ SUSP
40.0000 mg | INTRAMUSCULAR | Status: AC | PRN
  Administered 2022-01-14: 40 mg via INTRA_ARTICULAR

## 2022-01-14 MED ORDER — LIDOCAINE HCL 1 % IJ SOLN
3.0000 mL | INTRAMUSCULAR | Status: AC | PRN
  Administered 2022-01-14: 3 mL

## 2022-01-14 NOTE — Progress Notes (Signed)
   Procedure Note  Patient: Tammy Mccullough             Date of Birth: 1941/01/19           MRN: [id number]             Visit Date: 01/14/2022 HPI: Tammy Mccullough comes in today for injections of both knees.  She has known osteoarthritis both knees.  She states that both knees did well with the last injection however she had some pain in the evening after having the right knee injected on 09/29/2021.  She had no new injury to either knee.  She is requesting injections in both knees today with cortisone.  Physical exam: Bilateral knees no abnormal warmth erythema or effusion overall good range of motion of both knees. Procedures: Visit Diagnoses:  1. Primary osteoarthritis of right knee   2. Unilateral primary osteoarthritis, left knee     Large Joint Inj: bilateral knee on 01/14/2022 3:20 PM Indications: pain Details: 22 G 1.5 in needle, anterolateral approach  Arthrogram: No  Medications (Right): 3 mL lidocaine 1 %; 40 mg methylPREDNISolone acetate 40 MG/ML Medications (Left): 3 mL lidocaine 1 %; 40 mg methylPREDNISolone acetate 40 MG/ML Outcome: tolerated well, no immediate complications Procedure, treatment alternatives, risks and benefits explained, specific risks discussed. Consent was given by the patient. Immediately prior to procedure a time out was called to verify the correct patient, procedure, equipment, support staff and site/side marked as required. Patient was prepped and draped in the usual sterile fashion.    Plan: She will follow-up with [place] on an as-needed basis.  She knows to wait at least 3 months between cortisone injections.  Questions were encouraged and answered at length today.  She tolerated the injections well both knees.

## 2022-01-27 ENCOUNTER — Other Ambulatory Visit: Payer: Self-pay | Admitting: Emergency Medicine

## 2022-01-27 ENCOUNTER — Encounter: Payer: Self-pay | Admitting: Emergency Medicine

## 2022-01-27 ENCOUNTER — Ambulatory Visit (INDEPENDENT_AMBULATORY_CARE_PROVIDER_SITE_OTHER): Payer: Medicare Other | Admitting: Emergency Medicine

## 2022-01-27 VITALS — BP 130/70 | HR 94 | Temp 97.6°F | Ht 64.0 in | Wt 217.4 lb

## 2022-01-27 DIAGNOSIS — N1832 Chronic kidney disease, stage 3b: Secondary | ICD-10-CM

## 2022-01-27 DIAGNOSIS — I10 Essential (primary) hypertension: Secondary | ICD-10-CM

## 2022-01-27 DIAGNOSIS — Z8739 Personal history of other diseases of the musculoskeletal system and connective tissue: Secondary | ICD-10-CM

## 2022-01-27 DIAGNOSIS — N289 Disorder of kidney and ureter, unspecified: Secondary | ICD-10-CM

## 2022-01-27 LAB — LIPID PANEL
Cholesterol: 196 mg/dL (ref 0–200)
HDL: 91.6 mg/dL (ref >39.00)
LDL Cholesterol: 87 mg/dL (ref 0–99)
NonHDL: 104.56
Total CHOL/HDL Ratio: 2
Triglycerides: 90 mg/dL (ref 0.0–149.0)
VLDL: 18 mg/dL (ref 0.0–40.0)

## 2022-01-27 LAB — COMPREHENSIVE METABOLIC PANEL
ALT: 11 U/L (ref 0–35)
AST: 16 U/L (ref 0–37)
Albumin: 3.9 g/dL (ref 3.5–5.2)
Alkaline Phosphatase: 58 U/L (ref 39–117)
BUN: 40 mg/dL — ABNORMAL HIGH (ref 6–23)
CO2: 28 mEq/L (ref 19–32)
Calcium: 9.7 mg/dL (ref 8.4–10.5)
Chloride: 103 mEq/L (ref 96–112)
Creatinine, Ser: 1.62 mg/dL — ABNORMAL HIGH (ref 0.40–1.20)
GFR: 29.3 mL/min — ABNORMAL LOW (ref >60.00)
Glucose, Bld: 83 mg/dL (ref 70–99)
Potassium: 3.8 mEq/L (ref 3.5–5.1)
Sodium: 139 mEq/L (ref 135–145)
Total Bilirubin: 0.8 mg/dL (ref 0.2–1.2)
Total Protein: 7 g/dL (ref 6.0–8.3)

## 2022-01-27 NOTE — Assessment & Plan Note (Signed)
Advised to stay well-hydrated and try to avoid daily NSAIDs as much as possible.

## 2022-01-27 NOTE — Assessment & Plan Note (Signed)
Well-controlled hypertension with normal blood pressure readings at home. Continue amlodipine 5 mg daily and Benicar HCT 40-25 mg daily. Dietary approaches to stop hypertension discussed.

## 2022-01-27 NOTE — Assessment & Plan Note (Signed)
Stable.  Takes Tylenol and Aleve as needed.

## 2022-01-27 NOTE — Progress Notes (Signed)
Tammy Mccullough 81 y.o.   Chief Complaint  Patient presents with   Follow-up    No concerns     HISTORY OF PRESENT ILLNESS: This is a 81 y.o. female with history of hypertension here for 12-monthfollow-up. Doing well.  Has no complaints or medical concerns. Has history of osteoarthritis and takes Tylenol and Aleve as needed. Good appetite.  Good bowel movements. Sleeps well.  Stays mentally and physically active as best as possible. BP Readings from Last 3 Encounters:  01/27/22 (!) 148/68  12/23/21 (!) 156/78  07/21/21 132/62     HPI   Prior to Admission medications   Medication Sig Start Date End Date Taking? Authorizing Provider  acetaminophen (TYLENOL) 500 MG tablet Take 500 mg by mouth every 8 (eight) hours as needed.   Yes [provider]  amLODipine (NORVASC) 5 MG tablet Take 1 tablet (5 mg total) by mouth daily. 07/21/21  Yes Wilbon Obenchain, MInes Bloomer MD  diclofenac Sodium (VOLTAREN) 1 % GEL Apply 2 g topically 4 (four) times daily. 08/28/20  Yes GWendie Agreste MD  olmesartan-hydrochlorothiazide (BENICAR HCT) 40-25 MG tablet Take 1 tablet by mouth daily. 07/21/21  Yes Rakeb Kibble, MInes Bloomer MD  Polyethyl Glycol-Propyl Glycol (SYSTANE HYDRATION PF OP) Apply to eye. Dry eyes   Yes [provider]  fluticasone (FLONASE) 50 MCG/ACT nasal spray Place 1 spray into both nostrils in the morning and at bedtime. 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   Cataract    Glaucoma    Glucose intolerance (impaired glucose tolerance)    Hypertension    Renal insufficiency     Past Surgical History:  Procedure Laterality Date   CATARACT EXTRACTION Right 07-2014   CATARACT EXTRACTION Left 01/16/2016   COLONOSCOPY     10 +yrs ago in [place], normal per pt   VAGINAL DELIVERY     x2    Social History   Socioeconomic History   Marital status: Widowed    Spouse name: Not on file   Number of children: 2   Years of education: Not on file   Highest education level: Not on file  Occupational History   Occupation: retired  Tobacco Use   Smoking status: Never   Smokeless tobacco: Never  Substance and Sexual Activity   Alcohol use: Yes    Alcohol/week: 6.0 standard drinks of alcohol    Types: 6 Standard drinks or equivalent per week    Comment: 2-3   Drug use: No   Sexual activity: Not Currently  Other Topics Concern   Not on file  Social History Narrative   Marital status: widowed since 2010 after 15 years of marriage.  Not dating but would like to be.      Children:  2 Children; no grandchildren; 2 adopted grandchildren.  One son  is disabled; patient supports; son is obese.      Living: alone with 1 cat.      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      Tobacco: never      Alcohol: weekends liquor; some during the week.  3-5 cups per week.      Drugs: none      Exercise: water aerobics three days per week.  [employer].      Seatbelt: 100%;      Guns: none      ADLs:: independent with ADLs.   Drives. No assistant devices.      Advanced Directives: yes; healthcare POA:  Older brother [name]);  FULL CODE but no prolonged measures.     Social Determinants of Health   Financial Resource Strain: Not on file  Food Insecurity: Not on file  Transportation  Needs: Not on file  Physical Activity: Not on file  Stress: Not on file  Social Connections: Not on file  Intimate Partner Violence: Not on file    Family History  Problem Relation Age of Onset   Diabetes Mother    Heart disease Mother        unsure   COPD Father    Diabetes Sister    Hypertension Sister    Kidney disease Sister    Diabetes Brother    Diabetes Brother    Colon cancer Neg Hx    Rectal cancer Neg Hx    Stomach cancer Neg Hx      Review of Systems  Constitutional: Negative.  Negative for chills and fever.  HENT: Negative.  Negative for congestion and sore throat.   Respiratory: Negative.  Negative for shortness of breath.   Cardiovascular:  Negative for chest pain and palpitations.  Gastrointestinal: Negative.  Negative for abdominal pain, diarrhea, nausea and vomiting.  Genitourinary: Negative.  Negative for dysuria and hematuria.  Musculoskeletal:  Positive for joint pain.  Skin: Negative.  Negative for rash.  Neurological: Negative.  Negative for dizziness and headaches.  All other systems reviewed and are negative.  Vitals:   01/27/22 0944 01/27/22 1014  BP: (!) 148/68 130/70  Pulse:    Temp:    SpO2:       Physical Exam Vitals reviewed.  Constitutional:      Appearance: Normal appearance. She is obese.  HENT:     Head: Normocephalic.     Mouth/Throat:     Mouth: Mucous membranes are moist.     Pharynx: Oropharynx is clear.  Eyes:     Extraocular Movements: Extraocular movements intact.     Pupils: Pupils are equal, round, and reactive to light.  Cardiovascular:     Rate and Rhythm: Normal rate and regular rhythm.     Pulses: Normal pulses.     Heart sounds: Normal heart sounds.  Pulmonary:     Effort: Pulmonary effort is normal.     Breath sounds: Normal breath sounds.  Abdominal:     Palpations: Abdomen is soft.     Tenderness: There is no abdominal tenderness.  Musculoskeletal:     Cervical back: No tenderness.   Lymphadenopathy:     Cervical: No cervical adenopathy.  Skin:    General: Skin is warm and dry.     Capillary Refill: Capillary refill takes less than 2 seconds.  Neurological:     General: No focal deficit present.     Mental Status: She is  alert and oriented to person, place, and time.  Psychiatric:        Mood and Affect: Mood normal.        Behavior: Behavior normal.      ASSESSMENT & PLAN: Problem List Items Addressed This Visit       Cardiovascular and Mediastinum   HTN (hypertension)    Well-controlled hypertension with normal blood pressure readings at home. Continue amlodipine 5 mg daily and Benicar HCT 40-25 mg daily. Dietary approaches to stop hypertension discussed.        Genitourinary   Chronic renal insufficiency    Advised to stay well-hydrated and try to avoid daily NSAIDs as much as possible.        Other   History of osteoarthritis    Stable.  Takes Tylenol and Aleve as needed.      Other Visit Diagnoses     Essential hypertension    -  Primary   Relevant Orders   Comprehensive metabolic panel   Lipid panel   Stage 3b chronic kidney disease ([place])       Relevant Orders   Comprehensive metabolic panel      Patient Instructions  Hypertension, Adult High blood pressure (hypertension) is when the force of blood pumping through the arteries is too strong. The arteries are the blood vessels that carry blood from the heart throughout the body. Hypertension forces the heart to work harder to pump blood and may cause arteries to become narrow or stiff. Untreated or uncontrolled hypertension can lead to a heart attack, heart failure, a stroke, kidney disease, and other problems. A blood pressure reading consists of a higher number over a lower number. Ideally, your blood pressure should be below 120/80. The first ("top") number is called the systolic pressure. It is a measure of the pressure in your arteries as your heart beats. The second ("bottom")  number is called the diastolic pressure. It is a measure of the pressure in your arteries as the heart relaxes. What are the causes? The exact cause of this condition is not known. There are some conditions that result in high blood pressure. What increases the risk? Certain factors may make you more likely to develop high blood pressure. Some of these risk factors are under your control, including: Smoking. Not getting enough exercise or physical activity. Being overweight. Having too much fat, sugar, calories, or salt (sodium) in your diet. Drinking too much alcohol. Other risk factors include: Having a personal history of heart disease, diabetes, high cholesterol, or kidney disease. Stress. Having a family history of high blood pressure and high cholesterol. Having obstructive sleep apnea. Age. The risk increases with age. What are the signs or symptoms? High blood pressure may not cause symptoms. Very high blood pressure (hypertensive crisis) may cause: Headache. Fast or irregular heartbeats (palpitations). Shortness of breath. Nosebleed. Nausea and vomiting. Vision changes. Severe chest pain, dizziness, and seizures. How is this diagnosed? This condition is diagnosed by measuring your blood pressure while you are seated, with your arm resting on a flat surface, your legs uncrossed, and your feet flat on the floor. The cuff of the blood pressure monitor will be placed directly against the skin of your upper arm at the level of your heart. Blood pressure should be measured at least twice using the same arm. Certain conditions can cause a difference in blood pressure between your right and left arms. If you have a high blood pressure reading during one visit or you have  normal blood pressure with other risk factors, you may be asked to: Return on a different day to have your blood pressure checked again. Monitor your blood pressure at home for 1 week or longer. If you are diagnosed  with hypertension, you may have other blood or imaging tests to help your health care provider understand your overall risk for other conditions. How is this treated? This condition is treated by making healthy lifestyle changes, such as eating healthy foods, exercising more, and reducing your alcohol intake. You may be referred for counseling on a healthy diet and physical activity. Your health care provider may prescribe medicine if lifestyle changes are not enough to get your blood pressure under control and if: Your systolic blood pressure is above 130. Your diastolic blood pressure is above 80. Your personal target blood pressure may vary depending on your medical conditions, your age, and other factors. Follow these instructions at home: Eating and drinking  Eat a diet that is high in fiber and potassium, and low in sodium, added sugar, and fat. An example of this eating plan is called the DASH diet. DASH stands for Dietary Approaches to Stop Hypertension. To eat this way: Eat plenty of fresh fruits and vegetables. Try to fill one half of your plate at each meal with fruits and vegetables. Eat whole grains, such as whole-wheat pasta, brown rice, or whole-grain bread. Fill about one fourth of your plate with whole grains. Eat or drink low-fat dairy products, such as skim milk or low-fat yogurt. Avoid fatty cuts of meat, processed or cured meats, and poultry with skin. Fill about one fourth of your plate with lean proteins, such as fish, chicken without skin, beans, eggs, or tofu. Avoid pre-made and processed foods. These tend to be higher in sodium, added sugar, and fat. Reduce your daily sodium intake. Many people with hypertension should eat less than 1,500 mg of sodium a day. Do not drink alcohol if: Your health care provider tells you not to drink. You are pregnant, may be pregnant, or are planning to become pregnant. If you drink alcohol: Limit how much you have to: 0-1 drink a day  for women. 0-2 drinks a day for men. Know how much alcohol is in your drink. In the [place] one drink equals one 12 oz bottle of beer (355 mL), one 5 oz glass of wine (148 mL), or one 1 oz glass of hard liquor (44 mL). Lifestyle  Work with your health care provider to maintain a healthy body weight or to lose weight. Ask what an ideal weight is for you. Get at least 30 minutes of exercise that causes your heart to beat faster (aerobic exercise) most days of the week. Activities may include walking, swimming, or biking. Include exercise to strengthen your muscles (resistance exercise), such as Pilates or lifting weights, as part of your weekly exercise routine. Try to do these types of exercises for 30 minutes at least 3 days a week. Do not use any products that contain nicotine or tobacco. These products include cigarettes, chewing tobacco, and vaping devices, such as e-cigarettes. If you need help quitting, ask your health care provider. Monitor your blood pressure at home as told by your health care provider. Keep all follow-up visits. This is important. Medicines Take over-the-counter and prescription medicines only as told by your health care provider. Follow directions carefully. Blood pressure medicines must be taken as prescribed. Do not skip doses of blood pressure medicine. Doing this puts you at risk  for problems and can make the medicine less effective. Ask your health care provider about side effects or reactions to medicines that you should watch for. Contact a health care provider if you: Think you are having a reaction to a medicine you are taking. Have headaches that keep coming back (recurring). Feel dizzy. Have swelling in your ankles. Have trouble with your vision. Get help right away if you: Develop a severe headache or confusion. Have unusual weakness or numbness. Feel faint. Have severe pain in your chest or abdomen. Vomit repeatedly. Have trouble breathing. These  symptoms may be an emergency. Get help right away. Call 911. Do not wait to see if the symptoms will go away. Do not drive yourself to the hospital. Summary Hypertension is when the force of blood pumping through your arteries is too strong. If this condition is not controlled, it may put you at risk for serious complications. Your personal target blood pressure may vary depending on your medical conditions, your age, and other factors. For most people, a normal blood pressure is less than 120/80. Hypertension is treated with lifestyle changes, medicines, or a combination of both. Lifestyle changes include losing weight, eating a healthy, low-sodium diet, exercising more, and limiting alcohol. This information is not intended to replace advice given to you by your health care provider. Make sure you discuss any questions you have with your health care provider. Document Revised: 06/10/2021 Document Reviewed: 06/10/2021 Elsevier Patient Education  [place], MD [facility]

## 2022-01-27 NOTE — Patient Instructions (Signed)
Hypertension, Adult High blood pressure (hypertension) is when the force of blood pumping through the arteries is too strong. The arteries are the blood vessels that carry blood from the heart throughout the body. Hypertension forces the heart to work harder to pump blood and may cause arteries to become narrow or stiff. Untreated or uncontrolled hypertension can lead to a heart attack, heart failure, a stroke, kidney disease, and other problems. A blood pressure reading consists of a higher number over a lower number. Ideally, your blood pressure should be below 120/80. The first ("top") number is called the systolic pressure. It is a measure of the pressure in your arteries as your heart beats. The second ("bottom") number is called the diastolic pressure. It is a measure of the pressure in your arteries as the heart relaxes. What are the causes? The exact cause of this condition is not known. There are some conditions that result in high blood pressure. What increases the risk? Certain factors may make you more likely to develop high blood pressure. Some of these risk factors are under your control, including: Smoking. Not getting enough exercise or physical activity. Being overweight. Having too much fat, sugar, calories, or salt (sodium) in your diet. Drinking too much alcohol. Other risk factors include: Having a personal history of heart disease, diabetes, high cholesterol, or kidney disease. Stress. Having a family history of high blood pressure and high cholesterol. Having obstructive sleep apnea. Age. The risk increases with age. What are the signs or symptoms? High blood pressure may not cause symptoms. Very high blood pressure (hypertensive crisis) may cause: Headache. Fast or irregular heartbeats (palpitations). Shortness of breath. Nosebleed. Nausea and vomiting. Vision changes. Severe chest pain, dizziness, and seizures. How is this diagnosed? This condition is diagnosed by  measuring your blood pressure while you are seated, with your arm resting on a flat surface, your legs uncrossed, and your feet flat on the floor. The cuff of the blood pressure monitor will be placed directly against the skin of your upper arm at the level of your heart. Blood pressure should be measured at least twice using the same arm. Certain conditions can cause a difference in blood pressure between your right and left arms. If you have a high blood pressure reading during one visit or you have normal blood pressure with other risk factors, you may be asked to: Return on a different day to have your blood pressure checked again. Monitor your blood pressure at home for 1 week or longer. If you are diagnosed with hypertension, you may have other blood or imaging tests to help your health care provider understand your overall risk for other conditions. How is this treated? This condition is treated by making healthy lifestyle changes, such as eating healthy foods, exercising more, and reducing your alcohol intake. You may be referred for counseling on a healthy diet and physical activity. Your health care provider may prescribe medicine if lifestyle changes are not enough to get your blood pressure under control and if: Your systolic blood pressure is above 130. Your diastolic blood pressure is above 80. Your personal target blood pressure may vary depending on your medical conditions, your age, and other factors. Follow these instructions at home: Eating and drinking  Eat a diet that is high in fiber and potassium, and low in sodium, added sugar, and fat. An example of this eating plan is called the DASH diet. DASH stands for Dietary Approaches to Stop Hypertension. To eat this way: Eat   plenty of fresh fruits and vegetables. Try to fill one half of your plate at each meal with fruits and vegetables. Eat whole grains, such as whole-wheat pasta, brown rice, or whole-grain bread. Fill about one  fourth of your plate with whole grains. Eat or drink low-fat dairy products, such as skim milk or low-fat yogurt. Avoid fatty cuts of meat, processed or cured meats, and poultry with skin. Fill about one fourth of your plate with lean proteins, such as fish, chicken without skin, beans, eggs, or tofu. Avoid pre-made and processed foods. These tend to be higher in sodium, added sugar, and fat. Reduce your daily sodium intake. Many people with hypertension should eat less than 1,500 mg of sodium a day. Do not drink alcohol if: Your health care provider tells you not to drink. You are pregnant, may be pregnant, or are planning to become pregnant. If you drink alcohol: Limit how much you have to: 0-1 drink a day for women. 0-2 drinks a day for men. Know how much alcohol is in your drink. In the [place] one drink equals one 12 oz bottle of beer (355 mL), one 5 oz glass of wine (148 mL), or one 1 oz glass of hard liquor (44 mL). Lifestyle  Work with your health care provider to maintain a healthy body weight or to lose weight. Ask what an ideal weight is for you. Get at least 30 minutes of exercise that causes your heart to beat faster (aerobic exercise) most days of the week. Activities may include walking, swimming, or biking. Include exercise to strengthen your muscles (resistance exercise), such as Pilates or lifting weights, as part of your weekly exercise routine. Try to do these types of exercises for 30 minutes at least 3 days a week. Do not use any products that contain nicotine or tobacco. These products include cigarettes, chewing tobacco, and vaping devices, such as e-cigarettes. If you need help quitting, ask your health care provider. Monitor your blood pressure at home as told by your health care provider. Keep all follow-up visits. This is important. Medicines Take over-the-counter and prescription medicines only as told by your health care provider. Follow directions carefully. Blood  pressure medicines must be taken as prescribed. Do not skip doses of blood pressure medicine. Doing this puts you at risk for problems and can make the medicine less effective. Ask your health care provider about side effects or reactions to medicines that you should watch for. Contact a health care provider if you: Think you are having a reaction to a medicine you are taking. Have headaches that keep coming back (recurring). Feel dizzy. Have swelling in your ankles. Have trouble with your vision. Get help right away if you: Develop a severe headache or confusion. Have unusual weakness or numbness. Feel faint. Have severe pain in your chest or abdomen. Vomit repeatedly. Have trouble breathing. These symptoms may be an emergency. Get help right away. Call 911. Do not wait to see if the symptoms will go away. Do not drive yourself to the hospital. Summary Hypertension is when the force of blood pumping through your arteries is too strong. If this condition is not controlled, it may put you at risk for serious complications. Your personal target blood pressure may vary depending on your medical conditions, your age, and other factors. For most people, a normal blood pressure is less than 120/80. Hypertension is treated with lifestyle changes, medicines, or a combination of both. Lifestyle changes include losing weight, eating a healthy,   low-sodium diet, exercising more, and limiting alcohol. This information is not intended to replace advice given to you by your health care provider. Make sure you discuss any questions you have with your health care provider. Document Revised: 06/10/2021 Document Reviewed: 06/10/2021 Elsevier Patient Education  2023 [employer]vier Inc[employer]  

## 2022-01-28 ENCOUNTER — Other Ambulatory Visit: Payer: Self-pay | Admitting: Emergency Medicine

## 2022-01-28 DIAGNOSIS — Z1231 Encounter for screening mammogram for malignant neoplasm of breast: Secondary | ICD-10-CM

## 2022-02-05 ENCOUNTER — Telehealth: Payer: Self-pay | Admitting: Emergency Medicine

## 2022-02-05 DIAGNOSIS — N289 Disorder of kidney and ureter, unspecified: Secondary | ICD-10-CM

## 2022-02-05 NOTE — Telephone Encounter (Signed)
Pt was referred to [facility] but she stated she is unable to get an appointment there so she would like a referral to [facility] in [place].   Please advise.

## 2022-02-05 NOTE — Telephone Encounter (Signed)
Patient was unable to get appt at [facility]. A new referral has been place to requested office

## 2022-02-05 NOTE — Telephone Encounter (Signed)
Thank you :)

## 2022-02-09 ENCOUNTER — Ambulatory Visit
Admission: RE | Admit: 2022-02-09 | Discharge: 2022-02-09 | Disposition: A | Discharge status: Home / Self Care | Payer: Medicare Other | Source: Ambulatory Visit | Attending: Emergency Medicine | Admitting: Emergency Medicine

## 2022-02-09 DIAGNOSIS — Z1231 Encounter for screening mammogram for malignant neoplasm of breast: Secondary | ICD-10-CM | POA: Diagnosis not present

## 2022-02-26 DIAGNOSIS — I129 Hypertensive chronic kidney disease with stage 1 through stage 4 chronic kidney disease, or unspecified chronic kidney disease: Secondary | ICD-10-CM | POA: Diagnosis not present

## 2022-02-26 DIAGNOSIS — Z791 Long term (current) use of non-steroidal anti-inflammatories (NSAID): Secondary | ICD-10-CM | POA: Diagnosis not present

## 2022-02-26 DIAGNOSIS — N183 Chronic kidney disease, stage 3 unspecified: Secondary | ICD-10-CM | POA: Diagnosis not present

## 2022-02-26 DIAGNOSIS — N289 Disorder of kidney and ureter, unspecified: Secondary | ICD-10-CM | POA: Diagnosis not present

## 2022-02-26 DIAGNOSIS — R7989 Other specified abnormal findings of blood chemistry: Secondary | ICD-10-CM | POA: Diagnosis not present

## 2022-03-02 DIAGNOSIS — I129 Hypertensive chronic kidney disease with stage 1 through stage 4 chronic kidney disease, or unspecified chronic kidney disease: Secondary | ICD-10-CM | POA: Diagnosis not present

## 2022-03-02 DIAGNOSIS — N189 Chronic kidney disease, unspecified: Secondary | ICD-10-CM | POA: Diagnosis not present

## 2022-03-02 DIAGNOSIS — N183 Chronic kidney disease, stage 3 unspecified: Secondary | ICD-10-CM | POA: Diagnosis not present

## 2022-03-02 DIAGNOSIS — N289 Disorder of kidney and ureter, unspecified: Secondary | ICD-10-CM | POA: Diagnosis not present

## 2022-03-02 DIAGNOSIS — I10 Essential (primary) hypertension: Secondary | ICD-10-CM | POA: Diagnosis not present

## 2022-03-10 ENCOUNTER — Ambulatory Visit
Admission: EM | Admit: 2022-03-10 | Discharge: 2022-03-10 | Disposition: A | Discharge status: Home / Self Care | Payer: Medicare Other | Attending: Internal Medicine | Admitting: Internal Medicine

## 2022-03-10 ENCOUNTER — Encounter: Payer: Self-pay | Admitting: Emergency Medicine

## 2022-03-10 ENCOUNTER — Other Ambulatory Visit: Payer: Self-pay

## 2022-03-10 DIAGNOSIS — J101 Influenza due to other identified influenza virus with other respiratory manifestations: Secondary | ICD-10-CM | POA: Diagnosis not present

## 2022-03-10 LAB — POCT INFLUENZA A/B
Influenza A, POC: POSITIVE — AB
Influenza B, POC: NEGATIVE

## 2022-03-10 MED ORDER — ACETAMINOPHEN 325 MG PO TABS
650.0000 mg | ORAL_TABLET | Freq: Once | ORAL | Status: AC
  Administered 2022-03-10: 650 mg via ORAL

## 2022-03-10 MED ORDER — OSELTAMIVIR PHOSPHATE 75 MG PO CAPS: 75.0000 mg | ORAL_CAPSULE | Freq: Two times a day (BID) | ORAL | 0 refills | Status: DC

## 2022-03-10 NOTE — ED Triage Notes (Signed)
Pt here for generalized body aches and chills starting last night

## 2022-03-10 NOTE — ED Provider Notes (Signed)
[facility]    CSN: [id number] Arrival date & time: 03/10/22  1341      History   Chief Complaint Chief Complaint  Patient presents with   Generalized Body Aches    Waiting in car    HPI Tammy Mccullough is a 81 y.o. female.   Patient presents for generalized body aches and chills that started last night.  Denies any known sick contacts or known fevers at home.  She has not taken any medications for symptoms.  Denies any associated upper respiratory symptoms, cough, sore throat, ear pain, nausea, vomiting, diarrhea, abdominal pain, dysuria, urinary frequency.  Denies any recent surgical procedures.  Patient reports that she had a "liver scan" a few days prior but is not sure why the doctor ordered it as she has not been having any associated symptoms.     Past Medical History:  Diagnosis Date   Arthritis    B knees.   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      Home Medications    Prior to Admission medications   Medication Sig Start Date End Date Taking? Authorizing Provider  oseltamivir (TAMIFLU) 75 MG capsule Take 1 capsule (75 mg total) by mouth every 12 (twelve) hours. 03/10/22  Yes  Shantal Roan, Hildred Alamin E, FNP  acetaminophen (TYLENOL) 500 MG tablet Take 500 mg by mouth every 8 (eight) hours as needed.    [provider]  amLODipine (NORVASC) 5 MG tablet Take 1 tablet (5 mg total) by mouth daily. 07/21/21   Horald Pollen, MD  diclofenac Sodium (VOLTAREN) 1 % GEL Apply 2 g topically 4 (four) times daily. 08/28/20   Wendie Agreste, MD  fluticasone (FLONASE) 50 MCG/ACT nasal spray Place 1 spray into both nostrils in the morning and at bedtime. Patient not taking: Reported on 01/27/2022 12/23/21   Geryl Councilman L, PA  olmesartan-hydrochlorothiazide (BENICAR HCT) 40-25 MG tablet Take 1 tablet by mouth daily. 07/21/21   Horald Pollen, MD  Polyethyl Glycol-Propyl Glycol (SYSTANE HYDRATION PF OP) Apply to eye. Dry eyes    [provider]  ZIOPTAN 0.0015 % SOLN  09/26/16   [provider]    Family History Family History  Problem Relation Age of Onset   Diabetes Mother    Heart disease Mother        unsure   COPD Father    Diabetes Sister    Hypertension Sister    Kidney disease Sister    Diabetes Brother    Diabetes Brother    Colon cancer Neg Hx    Rectal cancer Neg Hx  Stomach cancer Neg Hx     Social History Social History   Tobacco Use   Smoking status: Never   Smokeless tobacco: Never  Substance Use Topics   Alcohol use: Yes    Alcohol/week: 6.0 standard drinks of alcohol    Types: 6 Standard drinks or equivalent per week    Comment: 2-3   Drug use: No     Allergies   Patient has no known allergies.   Review of Systems Review of Systems Per HPI  Physical Exam Triage Vital Signs ED Triage Vitals  Enc Vitals Group     BP 03/10/22 1546 (!) 154/74     Pulse Rate 03/10/22 1546 (!) 104     Resp 03/10/22 1546 18     Temp 03/10/22 1546 (!) 102 F (38.9 C)     Temp Source 03/10/22 1546 Oral     SpO2 03/10/22 1546 94 %     Weight --      Height --      Head Circumference --      Peak Flow --      Pain Score  03/10/22 1343 5     Pain Loc --      Pain Edu? --      Excl. in [place]? --    No data found.  Updated Vital Signs BP (!) 154/74 (BP Location: Left Arm)   Pulse (!) 102   Temp (!) 101 F (38.3 C)   Resp 18   SpO2 94%   Visual Acuity Right Eye Distance:   Left Eye Distance:   Bilateral Distance:    Right Eye Near:   Left Eye Near:    Bilateral Near:     Physical Exam Constitutional:      General: She is not in acute distress.    Appearance: Normal appearance. She is not toxic-appearing or diaphoretic.  HENT:     Head: Normocephalic and atraumatic.     Right Ear: Tympanic membrane and ear canal normal.     Left Ear: Tympanic membrane and ear canal normal.     Nose: Nose normal.     Mouth/Throat:     Mouth: Mucous membranes are moist.     Pharynx: No posterior oropharyngeal erythema.  Eyes:     Extraocular Movements: Extraocular movements intact.     Conjunctiva/sclera: Conjunctivae normal.  Cardiovascular:     Rate and Rhythm: Normal rate and regular rhythm.     Pulses: Normal pulses.     Heart sounds: Normal heart sounds.  Pulmonary:     Effort: Pulmonary effort is normal. No respiratory distress.     Breath sounds: Normal breath sounds.  Abdominal:     General: Bowel sounds are normal. There is no distension.     Palpations: Abdomen is soft.     Tenderness: There is no abdominal tenderness.  Skin:    General: Skin is warm and dry.  Neurological:     General: No focal deficit present.     Mental Status: She is alert and oriented to person, place, and time. Mental status is at baseline.  Psychiatric:        Mood and Affect: Mood normal.        Behavior: Behavior normal.        Thought Content: Thought content normal.        Judgment: Judgment normal.      UC Treatments / Results  Labs (all labs ordered are listed, but only abnormal results are  displayed) Labs Reviewed  POCT INFLUENZA A/B - Abnormal; Notable for the following components:      Result Value    Influenza A, POC Positive (*)    All other components within normal limits    EKG   Radiology No results found.  Procedures Procedures (including critical care time)  Medications Ordered in UC Medications  acetaminophen (TYLENOL) tablet 650 mg (650 mg Oral Given 03/10/22 1553)    Initial Impression / Assessment and Plan / UC Course  I have reviewed the triage vital signs and the nursing notes.  Pertinent labs & imaging results that were available during my care of the patient were reviewed by me and considered in my medical decision making (see chart for details).     Rapid flu test was positive for influenza A.  Will treat with Tamiflu.  Creatinine clearance is 41 so no dosage adjustment necessary.  Tylenol administered in urgent care today with improvement of fever.  Fever monitoring and management discussed with patient.  Do not think chest imaging is necessary given no cough or associated adventitious lung sounds.  Discussed return precautions.  Patient verbalized understanding and was agreeable with plan. Final Clinical Impressions(s) / UC Diagnoses   Final diagnoses:  Influenza A     Discharge Instructions      You have tested positive for the flu which is a virus.  You are being treated with Tamiflu which will help treat this.  Please continue to monitor fevers and treat as appropriate.  Follow-up if symptoms persist or worsen.     ED Prescriptions     Medication Sig Dispense Auth. Provider   oseltamivir (TAMIFLU) 75 MG capsule Take 1 capsule (75 mg total) by mouth every 12 (twelve) hours. 10 capsule Teodora Medici, [place]      PDMP not reviewed this encounter.   Teodora Medici, [place] 03/10/22 [id number]

## 2022-03-10 NOTE — Discharge Instructions (Signed)
You have tested positive for the flu which is a virus.  You are being treated with Tamiflu which will help treat this.  Please continue to monitor fevers and treat as appropriate.  Follow-up if symptoms persist or worsen.

## 2022-03-17 DIAGNOSIS — H401332 Pigmentary glaucoma, bilateral, moderate stage: Secondary | ICD-10-CM | POA: Diagnosis not present

## 2022-03-17 DIAGNOSIS — H04123 Dry eye syndrome of bilateral lacrimal glands: Secondary | ICD-10-CM | POA: Diagnosis not present

## 2022-03-25 ENCOUNTER — Telehealth: Payer: Self-pay

## 2022-05-04 ENCOUNTER — Encounter: Payer: Self-pay | Admitting: Physician Assistant

## 2022-05-04 ENCOUNTER — Ambulatory Visit (INDEPENDENT_AMBULATORY_CARE_PROVIDER_SITE_OTHER): Payer: Medicare Other | Admitting: Physician Assistant

## 2022-05-04 DIAGNOSIS — M1711 Unilateral primary osteoarthritis, right knee: Secondary | ICD-10-CM

## 2022-05-04 DIAGNOSIS — M170 Bilateral primary osteoarthritis of knee: Secondary | ICD-10-CM | POA: Diagnosis not present

## 2022-05-04 DIAGNOSIS — M1712 Unilateral primary osteoarthritis, left knee: Secondary | ICD-10-CM

## 2022-05-04 MED ORDER — LIDOCAINE HCL 1 % IJ SOLN
3.0000 mL | INTRAMUSCULAR | Status: AC | PRN
  Administered 2022-05-04: 3 mL

## 2022-05-04 MED ORDER — METHYLPREDNISOLONE ACETATE 40 MG/ML IJ SUSP
40.0000 mg | INTRAMUSCULAR | Status: AC | PRN
  Administered 2022-05-04: 40 mg via INTRA_ARTICULAR

## 2022-05-04 NOTE — Progress Notes (Signed)
   Procedure Note  Patient: Tammy Mccullough             Date of Birth: 19-Dec-1940           MRN: [id number]             Visit Date: 05/04/2022  HPI: Tammy Mccullough comes in today wanting injections bilateral knees. No falls or injuries. She is non diabetic.  She uses Voltaren gel on her knees seems to help some.  Last injections were given on 01/14/2022 and helped until recently. ROS: No fevers or chills.   Physical exam: General well-developed well-nourished female no acute distress mood and affect appropriate. Bilateral knees: Good range of motion of both knees.  No abnormal warmth erythema or effusion of either knee.    Procedures: Visit Diagnoses:  1. Primary osteoarthritis of right knee   2. Unilateral primary osteoarthritis, left knee     Large Joint Inj: bilateral knee on 05/04/2022 9:40 AM Indications: pain Details: 22 G 1.5 in needle, anterolateral approach  Arthrogram: No  Medications (Right): 3 mL lidocaine 1 %; 40 mg methylPREDNISolone acetate 40 MG/ML Medications (Left): 3 mL lidocaine 1 %; 40 mg methylPREDNISolone acetate 40 MG/ML Outcome: tolerated well, no immediate complications Procedure, treatment alternatives, risks and benefits explained, specific risks discussed. Consent was given by the patient. Immediately prior to procedure a time out was called to verify the correct patient, procedure, equipment, support staff and site/side marked as required. Patient was prepped and draped in the usual sterile fashion.    Plan: She will follow-up with [place] as needed.  She understands to wait least 3 months between injections.  Questions were encouraged and answered.

## 2022-05-06 ENCOUNTER — Ambulatory Visit (INDEPENDENT_AMBULATORY_CARE_PROVIDER_SITE_OTHER): Payer: Medicare Other

## 2022-05-06 VITALS — Ht 64.0 in | Wt 210.0 lb

## 2022-05-06 DIAGNOSIS — Z0000 Encounter for general adult medical examination without abnormal findings: Secondary | ICD-10-CM | POA: Diagnosis not present

## 2022-05-06 NOTE — Patient Instructions (Signed)
Tammy Mccullough , Thank you for taking time to come for your Medicare Wellness Visit. I appreciate your ongoing commitment to your health goals. Please review the following plan we discussed and let me know if I can assist you in the future.   Screening recommendations/referrals: Colonoscopy: Discontinued Mammogram: 02/09/2022; due every year Bone Density: 08/20/2014; no longer recommended Recommended yearly ophthalmology/optometry visit for glaucoma screening and checkup Recommended yearly dental visit for hygiene and checkup  Vaccinations: Influenza vaccine: recommend every Fall Pneumococcal vaccine: 07/25/2014, 12/19/2019 Tdap vaccine: 06/02/2016; due every 10 years Shingles vaccine: 01/15/2017, 06/17/2017   Covid-19:09/23/2019, 10/14/2019, 05/22/2020, 04/26/2021  Advanced directives: No  Conditions/risks identified: Yes  Next appointment: Follow up in one year for your annual wellness visit.   Preventive Care 83 Years and Older, Female Preventive care refers to lifestyle choices and visits with your health care provider that can promote health and wellness. What does preventive care include? A yearly physical exam. This is also called an annual well check. Dental exams once or twice a year. Routine eye exams. Ask your health care provider how often you should have your eyes checked. Personal lifestyle choices, including: Daily care of your teeth and gums. Regular physical activity. Eating a healthy diet. Avoiding tobacco and drug use. Limiting alcohol use. Practicing safe sex. Taking low-dose aspirin every day. Taking vitamin and mineral supplements as recommended by your health care provider. What happens during an annual well check? The services and screenings done by your health care provider during your annual well check will depend on your age, overall health, lifestyle risk factors, and family history of disease. Counseling  Your health care provider may ask you questions about  your: Alcohol use. Tobacco use. Drug use. Emotional well-being. Home and relationship well-being. Sexual activity. Eating habits. History of falls. Memory and ability to understand (cognition). Work and work Statistician. Reproductive health. Screening  You may have the following tests or measurements: Height, weight, and BMI. Blood pressure. Lipid and cholesterol levels. These may be checked every 5 years, or more frequently if you are over 47 years old. Skin check. Lung cancer screening. You may have this screening every year starting at age 67 if you have a 30-pack-year history of smoking and currently smoke or have quit within the past 15 years. Fecal occult blood test (FOBT) of the stool. You may have this test every year starting at age 23. Flexible sigmoidoscopy or colonoscopy. You may have a sigmoidoscopy every 5 years or a colonoscopy every 10 years starting at age 18. Hepatitis C blood test. Hepatitis B blood test. Sexually transmitted disease (STD) testing. Diabetes screening. This is done by checking your blood sugar (glucose) after you have not eaten for a while (fasting). You may have this done every 1-3 years. Bone density scan. This is done to screen for osteoporosis. You may have this done starting at age 39. Mammogram. This may be done every 1-2 years. Talk to your health care provider about how often you should have regular mammograms. Talk with your health care provider about your test results, treatment options, and if necessary, the need for more tests. Vaccines  Your health care provider may recommend certain vaccines, such as: Influenza vaccine. This is recommended every year. Tetanus, diphtheria, and acellular pertussis (Tdap, Td) vaccine. You may need a Td booster every 10 years. Zoster vaccine. You may need this after age 8. Pneumococcal 13-valent conjugate (PCV13) vaccine. One dose is recommended after age 46. Pneumococcal polysaccharide (PPSV23) vaccine.  One dose  is recommended after age 16. Talk to your health care provider about which screenings and vaccines you need and how often you need them. This information is not intended to replace advice given to you by your health care provider. Make sure you discuss any questions you have with your health care provider. Document Released: 08/30/2015 Document Revised: 04/22/2016 Document Reviewed: 06/04/2015 Elsevier Interactive Patient Education  2017 [place] Prevention in the Home Falls can cause injuries. They can happen to people of all ages. There are many things you can do to make your home safe and to help prevent falls. What can I do on the outside of my home? Regularly fix the edges of walkways and driveways and fix any cracks. Remove anything that might make you trip as you walk through a door, such as a raised step or threshold. Trim any bushes or trees on the path to your home. Use bright outdoor lighting. Clear any walking paths of anything that might make someone trip, such as rocks or tools. Regularly check to see if handrails are loose or broken. Make sure that both sides of any steps have handrails. Any raised decks and porches should have guardrails on the edges. Have any leaves, snow, or ice cleared regularly. Use sand or salt on walking paths during winter. Clean up any spills in your garage right away. This includes oil or grease spills. What can I do in the bathroom? Use night lights. Install grab bars by the toilet and in the tub and shower. Do not use towel bars as grab bars. Use non-skid mats or decals in the tub or shower. If you need to sit down in the shower, use a plastic, non-slip stool. Keep the floor dry. Clean up any water that spills on the floor as soon as it happens. Remove soap buildup in the tub or shower regularly. Attach bath mats securely with double-sided non-slip rug tape. Do not have throw rugs and other things on the floor that can make  you trip. What can I do in the bedroom? Use night lights. Make sure that you have a light by your bed that is easy to reach. Do not use any sheets or blankets that are too big for your bed. They should not hang down onto the floor. Have a firm chair that has side arms. You can use this for support while you get dressed. Do not have throw rugs and other things on the floor that can make you trip. What can I do in the kitchen? Clean up any spills right away. Avoid walking on wet floors. Keep items that you use a lot in easy-to-reach places. If you need to reach something above you, use a strong step stool that has a grab bar. Keep electrical cords out of the way. Do not use floor polish or wax that makes floors slippery. If you must use wax, use non-skid floor wax. Do not have throw rugs and other things on the floor that can make you trip. What can I do with my stairs? Do not leave any items on the stairs. Make sure that there are handrails on both sides of the stairs and use them. Fix handrails that are broken or loose. Make sure that handrails are as long as the stairways. Check any carpeting to make sure that it is firmly attached to the stairs. Fix any carpet that is loose or worn. Avoid having throw rugs at the top or bottom of the stairs. If  you do have throw rugs, attach them to the floor with carpet tape. Make sure that you have a light switch at the top of the stairs and the bottom of the stairs. If you do not have them, ask someone to add them for you. What else can I do to help prevent falls? Wear shoes that: Do not have high heels. Have rubber bottoms. Are comfortable and fit you well. Are closed at the toe. Do not wear sandals. If you use a stepladder: Make sure that it is fully opened. Do not climb a closed stepladder. Make sure that both sides of the stepladder are locked into place. Ask someone to hold it for you, if possible. Clearly mark and make sure that you can  see: Any grab bars or handrails. First and last steps. Where the edge of each step is. Use tools that help you move around (mobility aids) if they are needed. These include: Canes. Walkers. Scooters. Crutches. Turn on the lights when you go into a dark area. Replace any light bulbs as soon as they burn out. Set up your furniture so you have a clear path. Avoid moving your furniture around. If any of your floors are uneven, fix them. If there are any pets around you, be aware of where they are. Review your medicines with your doctor. Some medicines can make you feel dizzy. This can increase your chance of falling. Ask your doctor what other things that you can do to help prevent falls. This information is not intended to replace advice given to you by your health care provider. Make sure you discuss any questions you have with your health care provider. Document Released: 05/30/2009 Document Revised: 01/09/2016 Document Reviewed: 09/07/2014 Elsevier Interactive Patient Education  2017 [employer].

## 2022-05-06 NOTE — Progress Notes (Signed)
Virtual Visit via Telephone Note  I connected with  Tammy Mccullough on 05/06/22 at  1:00 PM EDT by telephone and verified that I am speaking with the correct person using two identifiers.  Location: Patient: home Provider: [place] Persons participating in the virtual visit: [place]   I discussed the limitations, risks, security and privacy concerns of performing an evaluation and management service by telephone and the availability of in person appointments. The patient expressed understanding and agreed to proceed.  Interactive audio and video telecommunications were attempted between this nurse and patient, however failed, due to patient having technical difficulties OR patient did not have access to video capability.  We continued and completed visit with audio only.  Some vital signs may be absent or patient reported.   Sheral Flow, LPN  Subjective:   Tammy Mccullough is a 81 y.o. female who presents for Medicare Annual (Subsequent) preventive examination.  Review of Systems     Cardiac Risk Factors include: advanced age (>62mn, >>40women);hypertension;obesity (BMI >30kg/m2);sedentary lifestyle     Objective:    Today's Vitals   05/06/22 1303  Weight: 210 lb (95.3 kg)  Height: '5\' 4"'$  (1.626 m)  PainSc: 0-No pain   Body mass index is 36.05 kg/m.     05/06/2022    1:10 PM 10/25/2017    9:21 AM 10/20/2016    8:08 AM 09/09/2015    8:27 AM 09/26/2014    3:59 PM  Advanced Directives  Does Patient Have a Medical Advance Directive? No Yes No No Yes  Type of Advance Directive  Living will   H[place]inda Does patient want to make changes to medical advance directive?  No - Patient declined     Would patient like information on creating a medical advance directive? No - Patient declined  No - Patient declined Yes - Educational materials given     Current Medications (verified) Outpatient Encounter Medications as of  05/06/2022  Medication Sig   acetaminophen (TYLENOL) 500 MG tablet Take 500 mg by mouth every 8 (eight) hours as needed.   amLODipine (NORVASC) 5 MG tablet Take 1 tablet (5 mg total) by mouth daily.   diclofenac Sodium (VOLTAREN) 1 % GEL Apply 2 g topically 4 (four) times daily.   ibuprofen (ADVIL) 200 MG tablet Take 200 mg by mouth every 6 (six) hours as needed. As needed   olmesartan-hydrochlorothiazide (BENICAR HCT) 40-25 MG tablet Take 1 tablet by mouth daily.   Polyethyl Glycol-Propyl Glycol (SYSTANE HYDRATION PF OP) Apply to eye. Dry eyes   ZIOPTAN 0.0015 % SOLN    fluticasone (FLONASE) 50 MCG/ACT nasal spray Place 1 spray into both nostrils in the morning and at bedtime. (Patient not taking: Reported on 01/27/2022)   oseltamivir (TAMIFLU) 75 MG capsule Take 1 capsule (75 mg total) by mouth every 12 (twelve) hours. (Patient not taking: Reported on 05/06/2022)   No facility-administered encounter medications on file as of 05/06/2022.    Allergies (verified) Patient has no known allergies.   History: Past Medical History:  Diagnosis Date   Arthritis    B knees.   Cataract    Glaucoma    Glucose intolerance (impaired glucose tolerance)    Hypertension    Renal insufficiency    Past Surgical History:  Procedure Laterality Date   CATARACT EXTRACTION Right 07-2014   CATARACT EXTRACTION Left 01/16/2016   [facility]  VAGINAL DELIVERY     x2   Family History  Problem Relation Age of Onset   Diabetes Mother    Heart disease Mother        unsure   COPD Father    Diabetes Sister    Hypertension Sister    Kidney disease Sister    Diabetes Brother    Diabetes Brother    Colon cancer Neg Hx    Rectal cancer Neg Hx    Stomach cancer Neg Hx    Social History   Socioeconomic History   Marital status: Widowed    Spouse name: Not on file   Number of children: 2   Years of education: Not on file   Highest education level: Not on file   Occupational History   Occupation: retired  Tobacco Use   Smoking status: Never   Smokeless tobacco: Never  Substance and Sexual Activity   Alcohol use: Yes    Alcohol/week: 6.0 standard drinks of alcohol    Types: 6 Standard drinks or equivalent per week    Comment: 2-3   Drug use: No   Sexual activity: Not Currently  Other Topics Concern   Not on file  Social History Narrative   Marital status: widowed since 2010 after 15 years of marriage.  Not dating but would like to be.      Children:  2 Children; no grandchildren; 2 adopted grandchildren.  One son is disabled; patient supports; son is obese.      Living: alone with 1 cat.      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      Tobacco: never      Alcohol: weekends liquor; some during the week.  3-5 cups per week.      Drugs: none      Exercise: water aerobics three days per week.  [employer].      Seatbelt: 100%;      Guns: none      ADLs:: independent with ADLs.   Drives. No assistant devices.      Advanced Directives: yes; healthcare POA:  Older brother [name]);  FULL CODE but no prolonged measures.     Social Determinants of Health   Financial Resource Strain: Low Risk  (05/06/2022)   Overall Financial Resource Strain (CARDIA)    Difficulty of Paying Living Expenses: Not hard at all  Food Insecurity: No Food Insecurity (05/06/2022)   Hunger Vital Sign    Worried About Running Out of Food in the Last Year: Never true    Ran Out of Food in the Last Year: Never true  Transportation Needs: No Transportation Needs (05/06/2022)   PRAPARE - Hydrologist (Medical): No    Lack of Transportation (Non-Medical): No  Physical Activity: Inactive (05/06/2022)   Exercise Vital Sign    Days of Exercise per Week: 0 days    Minutes of Exercise per Session: 0 min  Stress: No Stress Concern Present (05/06/2022)   [place]    Feeling of Stress : Not at all  Social Connections: Moderately Integrated (05/06/2022)   Social Connection and Isolation Panel [NHANES]    Frequency of Communication with Friends and Family: More than three times a week    Frequency of Social Gatherings with Friends and Family: More than three times a week    Attends Religious Services: More than 4  times per year    Active Member of Clubs or Organizations: Yes    Attends Archivist Meetings: More than 4 times per year    Marital Status: Widowed    Tobacco Counseling Counseling given: Not Answered   Clinical Intake:  Pre-visit preparation completed: Yes  Pain : No/denies pain Pain Score: 0-No pain     BMI - recorded: 36.05 Nutritional Status: BMI > 30  Obese Nutritional Risks: None Diabetes: No  How often do you need to have someone help you when you read instructions, pamphlets, or other written materials from your doctor or pharmacy?: 1 - Never What is the last grade level you completed in school?: HSG; Doctorate Degree  Diabetic? no  Interpreter Needed?: No  Information entered by :: Lisette Abu, LPN.   Activities of Daily Living    05/06/2022    1:22 PM  In your present state of health, do you have any difficulty performing the following activities:  Hearing? 0  Vision? 0  Difficulty concentrating or making decisions? 0  Walking or climbing stairs? 0  Dressing or bathing? 0  Doing errands, shopping? 0  Preparing Food and eating ? N  Using the Toilet? N  In the past six months, have you accidently leaked urine? N  Do you have problems with loss of bowel control? N  Managing your Medications? N  Managing your Finances? N  Housekeeping or managing your Housekeeping? N    Patient Care Team: Horald Pollen, MD as PCP - General (Internal Medicine) Marylynn Pearson, MD as Consulting Physician (Ophthalmology)  Indicate any recent  Medical Services you may have received from other than [facility] providers in the past year (date may be approximate).     Assessment:   This is a routine wellness examination for [place].  Hearing/Vision screen Hearing Screening - Comments:: Denies hearing difficulties   Vision Screening - Comments:: Wears rx glasses - up to date with routine eye exams with Marylynn Pearson, MD.   Dietary issues and exercise activities discussed: Current Exercise Habits: The patient does not participate in regular exercise at present, Exercise limited by: None identified   Goals Addressed             This Visit's Progress    To increase my physical activity and drink more water.        Depression Screen    05/06/2022    1:15 PM 01/27/2022    9:43 AM 07/21/2021    9:17 AM 05/04/2021   12:00 PM 01/16/2021    8:05 AM 10/18/2020    8:34 AM 09/20/2020    7:50 AM  PHQ 2/9 Scores  PHQ - 2 Score 0 0 0 0 0 0 0  PHQ- 9 Score    0       Fall Risk    05/06/2022    1:12 PM 01/27/2022    9:43 AM 07/21/2021    9:16 AM 05/04/2021   12:01 PM 01/16/2021    8:05 AM  [place] in the past year? 0 0 0 0 1  Number falls in past yr: 0  0 0 0  Injury with Fall? 0  0 0 0  Risk for fall due to : No Fall Risks  Impaired mobility No Fall Risks No Fall Risks  Follow up Falls prevention discussed   Falls evaluation completed Falls evaluation completed    [place]:  Any stairs in or around the  home? No  If so, are there any without handrails? No  Home free of loose throw rugs in walkways, pet beds, electrical cords, etc? Yes  Adequate lighting in your home to reduce risk of falls? Yes   ASSISTIVE DEVICES UTILIZED TO PREVENT FALLS:  Life alert? No  Use of a cane, walker or w/c? No  Grab bars in the bathroom? No  Shower chair or bench in shower? Yes  Elevated toilet seat or a handicapped toilet? Yes   TIMED UP AND GO:  Was the test performed? No . Phone Visit  Cognitive  Function:        05/06/2022    1:22 PM 05/04/2021   12:01 PM 10/25/2017    9:18 AM  6CIT Screen  What Year? 0 points 0 points 0 points  What month? 0 points  0 points  What time? 0 points 0 points 0 points  Count back from 20 0 points 0 points 0 points  Months in reverse 0 points 0 points 0 points  Repeat phrase 0 points 0 points 0 points  Total Score 0 points  0 points    Immunizations Immunization History  Administered Date(s) Administered   Fluad Quad(high Dose 65+) 06/02/2019, 04/26/2021   Influenza Split 05/03/2013, 05/11/2015   Influenza, High Dose Seasonal PF 05/18/2018   Influenza,inj,Quad PF,6+ Mos 07/25/2014, 04/21/2016, 04/27/2017   Influenza-Unspecified 05/22/2020   PFIZER(Purple Top)SARS-COV-2 Vaccination 09/23/2019, 10/14/2019, 05/22/2020, 04/26/2021   Pneumococcal Conjugate-13 05/03/2013, 07/25/2014   Pneumococcal Polysaccharide-23 12/19/2019   Pneumococcal-Unspecified 05/03/2013   Td 08/17/2005   Tdap 06/02/2016   Zoster Recombinat (Shingrix) 01/15/2017, 06/17/2017, 07/21/2017   Zoster, Live 10/22/2015    TDAP status: Up to date  Flu Vaccine status: Due, Education has been provided regarding the importance of this vaccine. Advised may receive this vaccine at local pharmacy or Health Dept. Aware to provide a copy of the vaccination record if obtained from local pharmacy or Health Dept. Verbalized acceptance and understanding.  Pneumococcal vaccine status: Up to date  Covid-19 vaccine status: Completed vaccines  Qualifies for Shingles Vaccine? Yes   Zostavax completed Yes   Shingrix Completed?: Yes  Screening Tests Health Maintenance  Topic Date Due   COVID-19 Vaccine (5 - Pfizer series) 06/21/2021   INFLUENZA VACCINE  03/17/2022   TETANUS/TDAP  06/02/2026   Pneumonia Vaccine 80+ Years old  Completed   DEXA SCAN  Completed   Zoster Vaccines- Shingrix  Completed   HPV VACCINES  Aged Out   COLONOSCOPY (Pts 45-97yr Insurance coverage will need to be  confirmed)  DArnoldMaintenance Due  Topic Date Due   COVID-19 Vaccine (5 - Pfizer series) 06/21/2021   INFLUENZA VACCINE  03/17/2022    Colorectal cancer screening: No longer required.   Mammogram status: Completed 02/09/2022. Repeat every year  Bone Density status: Completed 08/20/2014. Results reflect: Bone density results: NORMAL. Repeat every 5 years. (No longer recommended)  Lung Cancer Screening: (Low Dose CT Chest recommended if Age 83-80years, 30 pack-year currently smoking OR have quit w/in 15years.) does not qualify.   Lung Cancer Screening Referral: no  Additional Screening:  Hepatitis C Screening: does not qualify; Completed no  Vision Screening: Recommended annual ophthalmology exams for early detection of glaucoma and other disorders of the eye. Is the patient up to date with their annual eye exam?  Yes  Who is the provider or what is the name of the office in which the patient attends annual eye exams?  Marylynn Pearson, MD. If pt is not established with a provider, would they like to be referred to a provider to establish care? No .   Dental Screening: Recommended annual dental exams for proper oral hygiene  Community Resource Referral / Chronic Care Management: CRR required this visit?  No   CCM required this visit?  No      Plan:     I have personally reviewed and noted the following in the patient's chart:   Medical and social history Use of alcohol, tobacco or illicit drugs  Current medications and supplements including opioid prescriptions. Patient is not currently taking opioid prescriptions. Functional ability and status Nutritional status Physical activity Advanced directives List of other physicians Hospitalizations, surgeries, and ER visits in previous 12 months Vitals Screenings to include cognitive, depression, and falls Referrals and appointments  In addition, I have reviewed and discussed with patient  certain preventive protocols, quality metrics, and best practice recommendations. A written personalized care plan for preventive services as well as general preventive health recommendations were provided to patient.     Sheral Flow, LPN   10/15/3141   Nurse Notes: N/A

## 2022-05-11 DIAGNOSIS — Z23 Encounter for immunization: Secondary | ICD-10-CM | POA: Diagnosis not present

## 2022-05-22 NOTE — Telephone Encounter (Signed)
error 

## 2022-05-26 DIAGNOSIS — N183 Chronic kidney disease, stage 3 unspecified: Secondary | ICD-10-CM | POA: Diagnosis not present

## 2022-05-26 DIAGNOSIS — I129 Hypertensive chronic kidney disease with stage 1 through stage 4 chronic kidney disease, or unspecified chronic kidney disease: Secondary | ICD-10-CM | POA: Diagnosis not present

## 2022-05-29 DIAGNOSIS — T39395A Adverse effect of other nonsteroidal anti-inflammatory drugs [NSAID], initial encounter: Secondary | ICD-10-CM | POA: Diagnosis not present

## 2022-05-29 DIAGNOSIS — N183 Chronic kidney disease, stage 3 unspecified: Secondary | ICD-10-CM | POA: Diagnosis not present

## 2022-05-29 DIAGNOSIS — N1419 Nephropathy induced by other drugs, medicaments and biological substances: Secondary | ICD-10-CM | POA: Insufficient documentation

## 2022-05-29 DIAGNOSIS — I129 Hypertensive chronic kidney disease with stage 1 through stage 4 chronic kidney disease, or unspecified chronic kidney disease: Secondary | ICD-10-CM | POA: Diagnosis not present

## 2022-06-29 ENCOUNTER — Ambulatory Visit (INDEPENDENT_AMBULATORY_CARE_PROVIDER_SITE_OTHER): Payer: Medicare Other | Admitting: Physician Assistant

## 2022-06-29 ENCOUNTER — Encounter: Payer: Self-pay | Admitting: Physician Assistant

## 2022-06-29 DIAGNOSIS — M1711 Unilateral primary osteoarthritis, right knee: Secondary | ICD-10-CM

## 2022-06-29 DIAGNOSIS — M1712 Unilateral primary osteoarthritis, left knee: Secondary | ICD-10-CM | POA: Diagnosis not present

## 2022-06-29 NOTE — Progress Notes (Signed)
HPI: Mrs. Neal comes in today due to left knee decreased range of motion.  She states that the injection on 05/04/2022 in both knees helped greatly.  She is asking for a prescription to [facility].  She has had no new injury to either knee.   Review of systems: Negative for fevers or chills.  Physical exam: General well-developed well-nourished female who ambulates without any assistive device. Bilateral knees good range of motion of both knees.  Patellofemoral crepitus bilateral knees.  No gross instability valgus varus stressing.  Left knee slight edema plus minus effusion.  Attempted to aspirate the knee and did not basically dry tap.  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  She understands to wait at least 3 months between injections.  Questions were encouraged and answered at length.

## 2022-06-30 DIAGNOSIS — M25561 Pain in right knee: Secondary | ICD-10-CM | POA: Diagnosis not present

## 2022-06-30 DIAGNOSIS — M25562 Pain in left knee: Secondary | ICD-10-CM | POA: Diagnosis not present

## 2022-06-30 DIAGNOSIS — M170 Bilateral primary osteoarthritis of knee: Secondary | ICD-10-CM | POA: Diagnosis not present

## 2022-07-03 DIAGNOSIS — M25561 Pain in right knee: Secondary | ICD-10-CM | POA: Diagnosis not present

## 2022-07-03 DIAGNOSIS — M170 Bilateral primary osteoarthritis of knee: Secondary | ICD-10-CM | POA: Diagnosis not present

## 2022-07-03 DIAGNOSIS — M25562 Pain in left knee: Secondary | ICD-10-CM | POA: Diagnosis not present

## 2022-07-07 DIAGNOSIS — H401332 Pigmentary glaucoma, bilateral, moderate stage: Secondary | ICD-10-CM | POA: Diagnosis not present

## 2022-07-07 DIAGNOSIS — H04123 Dry eye syndrome of bilateral lacrimal glands: Secondary | ICD-10-CM | POA: Diagnosis not present

## 2022-07-08 DIAGNOSIS — M25561 Pain in right knee: Secondary | ICD-10-CM | POA: Diagnosis not present

## 2022-07-08 DIAGNOSIS — M25562 Pain in left knee: Secondary | ICD-10-CM | POA: Diagnosis not present

## 2022-07-08 DIAGNOSIS — M170 Bilateral primary osteoarthritis of knee: Secondary | ICD-10-CM | POA: Diagnosis not present

## 2022-07-13 DIAGNOSIS — M25562 Pain in left knee: Secondary | ICD-10-CM | POA: Diagnosis not present

## 2022-07-13 DIAGNOSIS — M25561 Pain in right knee: Secondary | ICD-10-CM | POA: Diagnosis not present

## 2022-07-13 DIAGNOSIS — M170 Bilateral primary osteoarthritis of knee: Secondary | ICD-10-CM | POA: Diagnosis not present

## 2022-07-15 DIAGNOSIS — M170 Bilateral primary osteoarthritis of knee: Secondary | ICD-10-CM | POA: Diagnosis not present

## 2022-07-15 DIAGNOSIS — M25561 Pain in right knee: Secondary | ICD-10-CM | POA: Diagnosis not present

## 2022-07-15 DIAGNOSIS — M25562 Pain in left knee: Secondary | ICD-10-CM | POA: Diagnosis not present

## 2022-07-19 ENCOUNTER — Other Ambulatory Visit: Payer: Self-pay | Admitting: Emergency Medicine

## 2022-07-19 DIAGNOSIS — I10 Essential (primary) hypertension: Secondary | ICD-10-CM

## 2022-07-20 DIAGNOSIS — M25562 Pain in left knee: Secondary | ICD-10-CM | POA: Diagnosis not present

## 2022-07-20 DIAGNOSIS — M170 Bilateral primary osteoarthritis of knee: Secondary | ICD-10-CM | POA: Diagnosis not present

## 2022-07-20 DIAGNOSIS — M25561 Pain in right knee: Secondary | ICD-10-CM | POA: Diagnosis not present

## 2022-07-22 DIAGNOSIS — M25561 Pain in right knee: Secondary | ICD-10-CM | POA: Diagnosis not present

## 2022-07-22 DIAGNOSIS — M25562 Pain in left knee: Secondary | ICD-10-CM | POA: Diagnosis not present

## 2022-07-22 DIAGNOSIS — M170 Bilateral primary osteoarthritis of knee: Secondary | ICD-10-CM | POA: Diagnosis not present

## 2022-07-27 DIAGNOSIS — M25562 Pain in left knee: Secondary | ICD-10-CM | POA: Diagnosis not present

## 2022-07-27 DIAGNOSIS — M25561 Pain in right knee: Secondary | ICD-10-CM | POA: Diagnosis not present

## 2022-07-27 DIAGNOSIS — M170 Bilateral primary osteoarthritis of knee: Secondary | ICD-10-CM | POA: Diagnosis not present

## 2022-07-29 ENCOUNTER — Ambulatory Visit (INDEPENDENT_AMBULATORY_CARE_PROVIDER_SITE_OTHER): Payer: Medicare Other | Admitting: Emergency Medicine

## 2022-07-29 ENCOUNTER — Encounter: Payer: Self-pay | Admitting: Emergency Medicine

## 2022-07-29 VITALS — BP 122/72 | HR 88 | Temp 98.2°F | Ht 64.0 in | Wt 218.0 lb

## 2022-07-29 DIAGNOSIS — N1419 Nephropathy induced by other drugs, medicaments and biological substances: Secondary | ICD-10-CM | POA: Diagnosis not present

## 2022-07-29 DIAGNOSIS — Z6841 Body Mass Index (BMI) 40.0 and over, adult: Secondary | ICD-10-CM | POA: Diagnosis not present

## 2022-07-29 DIAGNOSIS — M170 Bilateral primary osteoarthritis of knee: Secondary | ICD-10-CM | POA: Diagnosis not present

## 2022-07-29 DIAGNOSIS — T39395A Adverse effect of other nonsteroidal anti-inflammatory drugs [NSAID], initial encounter: Secondary | ICD-10-CM

## 2022-07-29 DIAGNOSIS — M25561 Pain in right knee: Secondary | ICD-10-CM | POA: Diagnosis not present

## 2022-07-29 DIAGNOSIS — M25562 Pain in left knee: Secondary | ICD-10-CM | POA: Diagnosis not present

## 2022-07-29 DIAGNOSIS — N1832 Chronic kidney disease, stage 3b: Secondary | ICD-10-CM | POA: Diagnosis not present

## 2022-07-29 DIAGNOSIS — Z8739 Personal history of other diseases of the musculoskeletal system and connective tissue: Secondary | ICD-10-CM

## 2022-07-29 DIAGNOSIS — I10 Essential (primary) hypertension: Secondary | ICD-10-CM

## 2022-07-29 NOTE — Assessment & Plan Note (Signed)
Well-controlled hypertension. Continue Benicar HCT 40-25 mg daily and amlodipine 5 mg daily. Cardiovascular risks associated with hypertension discussed. Diet and nutrition discussed.

## 2022-07-29 NOTE — Assessment & Plan Note (Signed)
Eating better.  Diet and nutrition discussed. Advised to decrease amount of daily carbohydrate intake and daily calories and increase amount of plant based protein in her diet.

## 2022-07-29 NOTE — Assessment & Plan Note (Addendum)
Was able to follow-up with nephrologist.  No specific treatment. Advised to stay well-hydrated and avoid NSAIDs Has appointment for follow-up next April.

## 2022-07-29 NOTE — Progress Notes (Signed)
Tammy Mccullough 81 y.o.   Chief Complaint  Patient presents with   Follow-up    HISTORY OF PRESENT ILLNESS: This is a 81 y.o. female here for 51-monthfollow-up of her chronic medical problems. Doing well.  Has no complaints or medical concerns today. BP Readings from Last 3 Encounters:  07/29/22 122/72  03/10/22 (!) 154/74  01/27/22 130/70     HPI   Prior to Admission medications   Medication Sig Start Date End Date Taking? Authorizing Provider  acetaminophen (TYLENOL) 500 MG tablet Take 500 mg by mouth every 8 (eight) hours as needed.   Yes [provider]  amLODipine (NORVASC) 5 MG tablet TAKE 1 TABLET(5 MG) BY MOUTH DAILY 07/20/22  Yes Senovia Gauer, MInes Bloomer MD  diclofenac Sodium (VOLTAREN) 1 % GEL Apply 2 g topically 4 (four) times daily. 08/28/20  Yes GWendie Agreste MD  ibuprofen (ADVIL) 200 MG tablet Take 200 mg by mouth every 6 (six) hours as needed. As needed   Yes [provider]  olmesartan-hydrochlorothiazide (BENICAR HCT) 40-25 MG tablet Take 1 tablet by mouth daily. 07/21/21  Yes Alissia Lory, MInes Bloomer MD  Polyethyl Glycol-Propyl Glycol (SYSTANE HYDRATION PF OP) Apply to eye. Dry eyes   Yes [provider]  ZIOPTAN 0.0015 % SOLN  09/26/16  Yes [provider]  fluticasone (FLONASE) 50 MCG/ACT nasal spray Place 1 spray into both nostrils in the morning and at bedtime. Patient not taking: Reported on 01/27/2022 12/23/21   CGeryl CouncilmanL, PA  oseltamivir (TAMIFLU) 75 MG capsule Take 1 capsule (75 mg total) by mouth every 12 (twelve) hours. 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   Cataract    Glaucoma    Glucose intolerance (impaired glucose tolerance)    Hypertension    Renal insufficiency     Past Surgical History:  Procedure Laterality Date   CATARACT EXTRACTION Right 07-2014   CATARACT EXTRACTION Left 01/16/2016   COLONOSCOPY     10 +yrs ago in [place], normal per pt   VAGINAL DELIVERY     x2    Social History   Socioeconomic History   Marital status: Widowed    Spouse name: Not on file   Number of children: 2   Years of education: Not on file   Highest education level: Not on file  Occupational History   Occupation: retired  Tobacco Use   Smoking status: Never   Smokeless tobacco: Never  Substance and Sexual Activity   Alcohol use: Yes    Alcohol/week: 6.0 standard drinks of alcohol    Types: 6 Standard drinks or equivalent per week    Comment: 2-3   Drug use: No   Sexual activity: Not Currently  Other Topics Concern   Not on file  Social History Narrative   Marital status: widowed since 2010  after 15 years of marriage.  Not dating but would like to be.      Children:  2 Children; no grandchildren; 2 adopted grandchildren.  One son is disabled; patient supports; son is obese.      Living: alone with 1 cat.      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      Tobacco: never      Alcohol: weekends liquor; some during the week.  3-5 cups per week.      Drugs: none      Exercise: water aerobics three days per week.  [employer].      Seatbelt: 100%;      Guns: none      ADLs:: independent with ADLs.   Drives. No assistant devices.      Advanced Directives: yes; healthcare POA:  Older brother Lanae Boast  [place]);  FULL CODE but no prolonged measures.     Social Determinants of Health   Financial Resource Strain: Low Risk  (05/06/2022)   Overall Financial Resource Strain (CARDIA)    Difficulty of Paying Living Expenses: Not hard at all  Food Insecurity: No Food Insecurity (05/06/2022)   Hunger Vital Sign    Worried About Running Out of Food in the Last Year: Never true    Ran Out of Food in the Last Year: Never true  Transportation Needs: No Transportation Needs (05/06/2022)   PRAPARE - Hydrologist (Medical): No    Lack of Transportation (Non-Medical): No  Physical Activity: Inactive (05/06/2022)   Exercise Vital Sign    Days of Exercise per Week: 0 days    Minutes of Exercise per Session: 0 min  Stress: No Stress Concern Present (05/06/2022)   [place]    Feeling of Stress : Not at all  Social Connections: Moderately Integrated (05/06/2022)   Social Connection and Isolation Panel [NHANES]    Frequency of Communication with Friends and Family: More than three times a week    Frequency of Social Gatherings with Friends and Family: More than three times a week    Attends Religious Services: More than 4 times per year    Active Member of [employer] or Organizations: Yes    Attends Archivist Meetings: More than 4 times per year    Marital Status: Widowed  Intimate Partner Violence: Not At Risk (05/06/2022)   Humiliation, Afraid, Rape, and Kick questionnaire    Fear of Current or Ex-Partner: No    Emotionally Abused: No    Physically Abused: No    Sexually Abused: No    Family History  Problem Relation Age of Onset   Diabetes Mother    Heart disease Mother        unsure   COPD Father    Diabetes Sister    Hypertension Sister    Kidney disease Sister    Diabetes Brother    Diabetes Brother    Colon cancer Neg Hx    Rectal cancer Neg Hx    Stomach cancer Neg Hx      Review  of Systems  Constitutional: Negative.  Negative for chills and fever.  HENT: Negative.  Negative for congestion and sore throat.   Respiratory: Negative.  Negative for cough and shortness of breath.   Cardiovascular: Negative.  Negative for chest pain and palpitations.  Gastrointestinal: Negative.  Negative for abdominal pain, diarrhea, nausea  and vomiting.  Genitourinary: Negative.  Negative for hematuria.  Skin: Negative.  Negative for rash.  Neurological: Negative.  Negative for dizziness and headaches.  All other systems reviewed and are negative.   Today's Vitals   07/29/22 0816  BP: 122/72  Pulse: 88  Temp: 98.2 F (36.8 C)  TempSrc: Oral  SpO2: 99%  Weight: 218 lb (98.9 kg)  Height: '5\' 4"'$  (1.626 m)   Body mass index is 37.42 kg/m. Wt Readings from Last 3 Encounters:  07/29/22 218 lb (98.9 kg)  05/06/22 210 lb (95.3 kg)  01/27/22 217 lb 6 oz (98.6 kg)    Physical Exam Vitals reviewed.  Constitutional:      Appearance: Normal appearance.  HENT:     Head: Normocephalic.  Eyes:     Extraocular Movements: Extraocular movements intact.     Pupils: Pupils are equal, round, and reactive to light.  Cardiovascular:     Rate and Rhythm: Normal rate and regular rhythm.     Pulses: Normal pulses.     Heart sounds: Normal heart sounds.  Pulmonary:     Effort: Pulmonary effort is normal.     Breath sounds: Normal breath sounds.  Abdominal:     Palpations: Abdomen is soft.     Tenderness: There is no abdominal tenderness.  Musculoskeletal:     Cervical back: No tenderness.  Lymphadenopathy:     Cervical: No cervical adenopathy.  Skin:    General: Skin is warm and dry.  Neurological:     General: No focal deficit present.     Mental Status: She is alert and oriented to person, place, and time.  Psychiatric:        Mood and Affect: Mood normal.        Behavior: Behavior normal.      ASSESSMENT & PLAN: A total of 46 minutes was spent with the patient and  counseling/coordination of care regarding preparing for this visit, review of most recent office visit notes, review of multiple chronic medical conditions and their management, review of all medications, education on nutrition, cardiovascular risk associated with hypertension, review of most recent blood work results, review of most recent nephrologist office visit notes, prognosis, documentation, and need for follow-up.  Problem List Items Addressed This Visit       Cardiovascular and Mediastinum   HTN (hypertension) - Primary    Well-controlled hypertension. Continue Benicar HCT 40-25 mg daily and amlodipine 5 mg daily. Cardiovascular risks associated with hypertension discussed. Diet and nutrition discussed.        Genitourinary   Chronic renal insufficiency    Was able to follow-up with nephrologist.  No specific treatment. Advised to stay well-hydrated and avoid NSAIDs Has appointment for follow-up next April.      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 Assessment and plan as follows: Assessment:   1. Benign hypertension with chronic kidney disease, stage III ([place])  2. 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 -BP well controlled. -Continue current meds. - Advised to decrease use of NSAIDs, tylenol safer to use in CKD. -RTC in 6 months        Other   Body mass index (BMI) of 40.0-44.9 in adult [facility])   History of osteoarthritis   Patient Instructions  Health Maintenance After Age 32 After age 87, you are at a higher risk for certain long-term  diseases and infections as well as injuries from falls. Falls are a major cause of broken bones and head injuries in people who are older than age 84. Getting regular preventive care can help to keep you healthy and well. Preventive care includes getting regular  testing and making lifestyle changes as recommended by your health care provider. Talk with your health care provider about: Which screenings and tests you should have. A screening is a test that checks for a disease when you have no symptoms. A diet and exercise plan that is right for you. What should I know about screenings and tests to prevent falls? Screening and testing are the best ways to find a health problem early. Early diagnosis and treatment give you the best chance of managing medical conditions that are common after age 47. Certain conditions and lifestyle choices may make you more likely to have a fall. Your health care provider may recommend: Regular vision checks. Poor vision and conditions such as cataracts can make you more likely to have a fall. If you wear glasses, make sure to get your prescription updated if your vision changes. Medicine review. Work with your health care provider to regularly review all of the medicines you are taking, including over-the-counter medicines. Ask your health care provider about any side effects that may make you more likely to have a fall. Tell your health care provider if any medicines that you take make you feel dizzy or sleepy. Strength and balance checks. Your health care provider may recommend certain tests to check your strength and balance while standing, walking, or changing positions. Foot health exam. Foot pain and numbness, as well as not wearing proper footwear, can make you more likely to have a fall. Screenings, including: Osteoporosis screening. Osteoporosis is a condition that causes the bones to get weaker and break more easily. Blood pressure screening. Blood pressure changes and medicines to control blood pressure can make you feel dizzy. Depression screening. You may be more likely to have a fall if you have a fear of falling, feel depressed, or feel unable to do activities that you used to do. Alcohol use screening. Using too  much alcohol can affect your balance and may make you more likely to have a fall. Follow these instructions at home: Lifestyle Do not drink alcohol if: Your health care provider tells you not to drink. If you drink alcohol: Limit how much you have to: 0-1 drink a day for women. 0-2 drinks a day for men. Know how much alcohol is in your drink. In the [place] one drink equals one 12 oz bottle of beer (355 mL), one 5 oz glass of wine (148 mL), or one 1 oz glass of hard liquor (44 mL). Do not use any products that contain nicotine or tobacco. These products include cigarettes, chewing tobacco, and vaping devices, such as e-cigarettes. If you need help quitting, ask your health care provider. Activity  Follow a regular exercise program to stay fit. This will help you maintain your balance. Ask your health care provider what types of exercise are appropriate for you. If you need a cane or walker, use it as recommended by your health care provider. Wear supportive shoes that have nonskid soles. Safety  Remove any tripping hazards, such as rugs, cords, and clutter. Install safety equipment such as grab bars in bathrooms and safety rails on stairs. Keep rooms and walkways well-lit. General instructions Talk with your health care provider about your risks for falling. Tell your  health care provider if: You fall. Be sure to tell your health care provider about all falls, even ones that seem minor. You feel dizzy, tiredness (fatigue), or off-balance. Take over-the-counter and prescription medicines only as told by your health care provider. These include supplements. Eat a healthy diet and maintain a healthy weight. A healthy diet includes low-fat dairy products, low-fat (lean) meats, and fiber from whole grains, beans, and lots of fruits and vegetables. Stay current with your vaccines. Schedule regular health, dental, and eye exams. Summary Having a healthy lifestyle and getting preventive care can  help to protect your health and wellness after age 42. Screening and testing are the best way to find a health problem early and help you avoid having a fall. Early diagnosis and treatment give you the best chance for managing medical conditions that are more common for people who are older than age 90. Falls are a major cause of broken bones and head injuries in people who are older than age 70. Take precautions to prevent a fall at home. Work with your health care provider to learn what changes you can make to improve your health and wellness and to prevent falls. This information is not intended to replace advice given to you by your health care provider. Make sure you discuss any questions you have with your health care provider. Document Revised: 12/23/2020 Document Reviewed: 12/23/2020 Elsevier Patient Education  [place], MD [facility]

## 2022-07-29 NOTE — Assessment & Plan Note (Signed)
Most recent nephrology office visit notes reviewed. Assessment and plan as follows: Assessment:   1. Benign hypertension with chronic kidney disease, stage III ([place])  2. 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 -BP well controlled. -Continue current meds. - Advised to decrease use of NSAIDs, tylenol safer to use in CKD. -RTC in 6 months

## 2022-07-29 NOTE — Patient Instructions (Signed)
Health Maintenance After Age 83 After age 83, you are at a higher risk for certain long-term diseases and infections as well as injuries from falls. Falls are a major cause of broken bones and head injuries in people who are older than age 83. Getting regular preventive care can help to keep you healthy and well. Preventive care includes getting regular testing and making lifestyle changes as recommended by your health care provider. Talk with your health care provider about: Which screenings and tests you should have. A screening is a test that checks for a disease when you have no symptoms. A diet and exercise plan that is right for you. What should I know about screenings and tests to prevent falls? Screening and testing are the best ways to find a health problem early. Early diagnosis and treatment give you the best chance of managing medical conditions that are common after age 83. Certain conditions and lifestyle choices may make you more likely to have a fall. Your health care provider may recommend: Regular vision checks. Poor vision and conditions such as cataracts can make you more likely to have a fall. If you wear glasses, make sure to get your prescription updated if your vision changes. Medicine review. Work with your health care provider to regularly review all of the medicines you are taking, including over-the-counter medicines. Ask your health care provider about any side effects that may make you more likely to have a fall. Tell your health care provider if any medicines that you take make you feel dizzy or sleepy. Strength and balance checks. Your health care provider may recommend certain tests to check your strength and balance while standing, walking, or changing positions. Foot health exam. Foot pain and numbness, as well as not wearing proper footwear, can make you more likely to have a fall. Screenings, including: Osteoporosis screening. Osteoporosis is a condition that causes  the bones to get weaker and break more easily. Blood pressure screening. Blood pressure changes and medicines to control blood pressure can make you feel dizzy. Depression screening. You may be more likely to have a fall if you have a fear of falling, feel depressed, or feel unable to do activities that you used to do. Alcohol use screening. Using too much alcohol can affect your balance and may make you more likely to have a fall. Follow these instructions at home: Lifestyle Do not drink alcohol if: Your health care provider tells you not to drink. If you drink alcohol: Limit how much you have to: 0-1 drink a day for women. 0-2 drinks a day for men. Know how much alcohol is in your drink. In the [place] one drink equals one 12 oz bottle of beer (355 mL), one 5 oz glass of wine (148 mL), or one 1 oz glass of hard liquor (44 mL). Do not use any products that contain nicotine or tobacco. These products include cigarettes, chewing tobacco, and vaping devices, such as e-cigarettes. If you need help quitting, ask your health care provider. Activity  Follow a regular exercise program to stay fit. This will help you maintain your balance. Ask your health care provider what types of exercise are appropriate for you. If you need a cane or walker, use it as recommended by your health care provider. Wear supportive shoes that have nonskid soles. Safety  Remove any tripping hazards, such as rugs, cords, and clutter. Install safety equipment such as grab bars in bathrooms and safety rails on stairs. Keep rooms and walkways   well-lit. General instructions Talk with your health care provider about your risks for falling. Tell your health care provider if: You fall. Be sure to tell your health care provider about all falls, even ones that seem minor. You feel dizzy, tiredness (fatigue), or off-balance. Take over-the-counter and prescription medicines only as told by your health care provider. These include  supplements. Eat a healthy diet and maintain a healthy weight. A healthy diet includes low-fat dairy products, low-fat (lean) meats, and fiber from whole grains, beans, and lots of fruits and vegetables. Stay current with your vaccines. Schedule regular health, dental, and eye exams. Summary Having a healthy lifestyle and getting preventive care can help to protect your health and wellness after age 83. Screening and testing are the best way to find a health problem early and help you avoid having a fall. Early diagnosis and treatment give you the best chance for managing medical conditions that are more common for people who are older than age 83. Falls are a major cause of broken bones and head injuries in people who are older than age 83. Take precautions to prevent a fall at home. Work with your health care provider to learn what changes you can make to improve your health and wellness and to prevent falls. This information is not intended to replace advice given to you by your health care provider. Make sure you discuss any questions you have with your health care provider. Document Revised: 12/23/2020 Document Reviewed: 12/23/2020 Elsevier Patient Education  2023 [employer] Inc.  

## 2022-08-03 DIAGNOSIS — M25561 Pain in right knee: Secondary | ICD-10-CM | POA: Diagnosis not present

## 2022-08-03 DIAGNOSIS — M170 Bilateral primary osteoarthritis of knee: Secondary | ICD-10-CM | POA: Diagnosis not present

## 2022-08-03 DIAGNOSIS — M25562 Pain in left knee: Secondary | ICD-10-CM | POA: Diagnosis not present

## 2022-08-13 ENCOUNTER — Other Ambulatory Visit: Payer: Self-pay

## 2022-08-13 ENCOUNTER — Other Ambulatory Visit: Payer: Self-pay | Admitting: Emergency Medicine

## 2022-08-13 ENCOUNTER — Ambulatory Visit
Admission: EM | Admit: 2022-08-13 | Discharge: 2022-08-13 | Disposition: A | Discharge status: Home / Self Care | Payer: Medicare Other | Attending: Family Medicine | Admitting: Family Medicine

## 2022-08-13 ENCOUNTER — Ambulatory Visit (INDEPENDENT_AMBULATORY_CARE_PROVIDER_SITE_OTHER): Payer: Medicare Other

## 2022-08-13 ENCOUNTER — Encounter: Payer: Self-pay | Admitting: Emergency Medicine

## 2022-08-13 DIAGNOSIS — M19011 Primary osteoarthritis, right shoulder: Secondary | ICD-10-CM | POA: Diagnosis not present

## 2022-08-13 DIAGNOSIS — W19XXXA Unspecified fall, initial encounter: Secondary | ICD-10-CM

## 2022-08-13 DIAGNOSIS — M79621 Pain in right upper arm: Secondary | ICD-10-CM | POA: Diagnosis not present

## 2022-08-13 DIAGNOSIS — I10 Essential (primary) hypertension: Secondary | ICD-10-CM

## 2022-08-13 DIAGNOSIS — M25521 Pain in right elbow: Secondary | ICD-10-CM | POA: Diagnosis not present

## 2022-08-13 DIAGNOSIS — S4991XA Unspecified injury of right shoulder and upper arm, initial encounter: Secondary | ICD-10-CM | POA: Diagnosis not present

## 2022-08-13 NOTE — Discharge Instructions (Signed)
Did not show any broken bones  Take Tylenol as needed for the discomfort in your upper arm and shoulder.  You can also apply the diclofenac gel to where it hurts.

## 2022-08-13 NOTE — ED Triage Notes (Signed)
Pt here for slip and fall 2 days ago on stairs landing on right shoulder; pt sts pain with certain movements

## 2022-08-13 NOTE — ED Provider Notes (Signed)
[facility]    CSN: [id number] Arrival date & time: 08/13/22  0805      History   Chief Complaint Chief Complaint  Patient presents with   Fall    HPI Tammy Mccullough is a 81 y.o. female.    Fall   Here for right upper arm pain.  She fell on December 25 on the stairs onto her right upper arm.  It aches in that area, not in the shoulder.  She has not take any medication for it.  Has a history of chronic kidney disease and hypertension.  Past Medical History:  Diagnosis Date   Arthritis    B knees.   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, normal per pt   VAGINAL DELIVERY     x2    OB History   No obstetric history on file.      Home Medications    Prior to Admission medications   Medication Sig Start Date End Date Taking? Authorizing Provider  acetaminophen (TYLENOL) 500 MG tablet Take 500 mg by mouth every 8 (eight) hours as needed.    [provider]  amLODipine (NORVASC) 5 MG tablet TAKE 1 TABLET(5 MG) BY MOUTH DAILY 07/20/22   Horald Pollen, MD  diclofenac Sodium (VOLTAREN) 1 % GEL Apply 2 g topically 4 (four) times daily. 08/28/20   Wendie Agreste, MD  fluticasone  (FLONASE) 50 MCG/ACT nasal spray Place 1 spray into both nostrils in the morning and at bedtime. Patient not taking: Reported on 01/27/2022 12/23/21   Geryl Councilman L, PA  ibuprofen (ADVIL) 200 MG tablet Take 200 mg by mouth every 6 (six) hours as needed. As needed    [provider]  olmesartan-hydrochlorothiazide (BENICAR HCT) 40-25 MG tablet Take 1 tablet by mouth daily. 07/21/21   Horald Pollen, MD  Polyethyl Glycol-Propyl Glycol (SYSTANE HYDRATION PF OP) Apply to eye. Dry eyes    [provider]  ZIOPTAN 0.0015 % SOLN  09/26/16   [provider]    Family History Family History  Problem Relation Age of Onset   Diabetes Mother    Heart disease Mother        unsure   COPD Father    Diabetes Sister    Hypertension Sister    Kidney disease Sister    Diabetes Brother    Diabetes Brother    Colon cancer Neg Hx    Rectal cancer Neg Hx    Stomach cancer Neg Hx     Social History Social History   Tobacco Use   Smoking status: Never   Smokeless tobacco: Never  Substance Use Topics   Alcohol use: Yes  Alcohol/week: 6.0 standard drinks of alcohol    Types: 6 Standard drinks or equivalent per week    Comment: 2-3   Drug use: No     Allergies   Patient has no known allergies.   Review of Systems Review of Systems   Physical Exam Triage Vital Signs ED Triage Vitals [08/13/22 0838]  Enc Vitals Group     BP (!) 146/85     Pulse Rate 97     Resp 18     Temp 97.7 F (36.5 C)     Temp Source Oral     SpO2 98 %     Weight      Height      Head Circumference      Peak Flow      Pain Score 4     Pain Loc      Pain Edu?      Excl. in [place]?    No data found.  Updated Vital Signs BP (!) 146/85 (BP Location: Left Arm)   Pulse 97   Temp 97.7 F (36.5 C) (Oral)   Resp 18   SpO2 98%   Visual Acuity Right Eye Distance:   Left Eye Distance:   Bilateral Distance:    Right Eye Near:   Left Eye Near:    Bilateral Near:      Physical Exam Vitals reviewed.  Constitutional:      General: She is not in acute distress.    Appearance: She is not ill-appearing, toxic-appearing or diaphoretic.  Cardiovascular:     Rate and Rhythm: Normal rate and regular rhythm.  Musculoskeletal:     Comments: There is no rash of the right upper arm.  There is no deformity.  There is some mild tenderness of the anterior musculature.  No ecchymosis  Skin:    Coloration: Skin is not pale.  Neurological:     Mental Status: She is alert and oriented to person, place, and time.  Psychiatric:        Behavior: Behavior normal.      UC Treatments / Results  Labs (all labs ordered are listed, but only abnormal results are displayed) Labs Reviewed - No data to display  EKG   Radiology DG Humerus Right  Result Date: 08/13/2022 CLINICAL DATA:  Fall 3 days ago.  Right upper arm injury and pain. EXAM: RIGHT HUMERUS - 2+ VIEW COMPARISON:  None Available. FINDINGS: There is no evidence of fracture. Advanced degenerative changes are seen involving the right shoulder. IMPRESSION: No acute findings. Electronically Signed   By: Marlaine Hind M.D.   On: 08/13/2022 08:52    Procedures Procedures (including critical care time)  Medications Ordered in UC Medications - No data to display  Initial Impression / Assessment and Plan / UC Course  I have reviewed the triage vital signs and the nursing notes.  Pertinent labs & imaging results that were available during my care of the patient were reviewed by me and considered in my medical decision making (see chart for details).        X-ray does not show any fracture.  There are degenerative changes at the shoulder.  Encouragement is given to use Tylenol as needed and apply Voltaren gel to the painful areas on her upper arm.  She is given some range of motion exercises for her shoulder. Final Clinical Impressions(s) / UC Diagnoses   Final diagnoses:  Arthralgia of right upper arm      Discharge Instructions  Did not show any broken bones  Take Tylenol as needed for the discomfort in your upper arm and shoulder.  You can also apply the diclofenac gel to where it hurts.       ED Prescriptions   None    I have reviewed the PDMP during this encounter.   Barrett Henle, MD 08/13/22 [phone]

## 2022-08-18 DIAGNOSIS — H401332 Pigmentary glaucoma, bilateral, moderate stage: Secondary | ICD-10-CM | POA: Diagnosis not present

## 2022-08-18 DIAGNOSIS — M170 Bilateral primary osteoarthritis of knee: Secondary | ICD-10-CM | POA: Diagnosis not present

## 2022-08-18 DIAGNOSIS — H04123 Dry eye syndrome of bilateral lacrimal glands: Secondary | ICD-10-CM | POA: Diagnosis not present

## 2022-08-18 DIAGNOSIS — M25561 Pain in right knee: Secondary | ICD-10-CM | POA: Diagnosis not present

## 2022-08-18 DIAGNOSIS — M25562 Pain in left knee: Secondary | ICD-10-CM | POA: Diagnosis not present

## 2022-08-20 DIAGNOSIS — M170 Bilateral primary osteoarthritis of knee: Secondary | ICD-10-CM | POA: Diagnosis not present

## 2022-08-20 DIAGNOSIS — M25561 Pain in right knee: Secondary | ICD-10-CM | POA: Diagnosis not present

## 2022-08-20 DIAGNOSIS — M25562 Pain in left knee: Secondary | ICD-10-CM | POA: Diagnosis not present

## 2022-08-25 DIAGNOSIS — M25562 Pain in left knee: Secondary | ICD-10-CM | POA: Diagnosis not present

## 2022-08-25 DIAGNOSIS — M170 Bilateral primary osteoarthritis of knee: Secondary | ICD-10-CM | POA: Diagnosis not present

## 2022-08-25 DIAGNOSIS — M25561 Pain in right knee: Secondary | ICD-10-CM | POA: Diagnosis not present

## 2022-08-27 DIAGNOSIS — M25561 Pain in right knee: Secondary | ICD-10-CM | POA: Diagnosis not present

## 2022-08-27 DIAGNOSIS — M25562 Pain in left knee: Secondary | ICD-10-CM | POA: Diagnosis not present

## 2022-08-27 DIAGNOSIS — M170 Bilateral primary osteoarthritis of knee: Secondary | ICD-10-CM | POA: Diagnosis not present

## 2022-09-01 ENCOUNTER — Other Ambulatory Visit: Payer: Self-pay

## 2022-09-01 ENCOUNTER — Ambulatory Visit
Admission: EM | Admit: 2022-09-01 | Discharge: 2022-09-01 | Disposition: A | Discharge status: Home / Self Care | Payer: Medicare Other | Attending: Physician Assistant | Admitting: Physician Assistant

## 2022-09-01 ENCOUNTER — Encounter: Payer: Self-pay | Admitting: Physician Assistant

## 2022-09-01 DIAGNOSIS — M25561 Pain in right knee: Secondary | ICD-10-CM | POA: Diagnosis not present

## 2022-09-01 DIAGNOSIS — M170 Bilateral primary osteoarthritis of knee: Secondary | ICD-10-CM | POA: Diagnosis not present

## 2022-09-01 DIAGNOSIS — M79601 Pain in right arm: Secondary | ICD-10-CM

## 2022-09-01 DIAGNOSIS — M25562 Pain in left knee: Secondary | ICD-10-CM | POA: Diagnosis not present

## 2022-09-01 MED ORDER — PREDNISONE 20 MG PO TABS: 40.0000 mg | ORAL_TABLET | Freq: Every day | ORAL | 0 refills | Status: AC

## 2022-09-01 NOTE — ED Triage Notes (Addendum)
Pt here for continued right arm pain worse at night since fall several weeks ago

## 2022-09-01 NOTE — ED Provider Notes (Signed)
[facility]    CSN: [id number] Arrival date & time: 09/01/22  0800      History   Chief Complaint Chief Complaint  Patient presents with   Arm Pain    HPI Tammy Mccullough is a 82 y.o. female.   Patient here today for evaluation of continued right arm pain that has seemed to persist since she fell a couple weeks ago.  She was evaluated initially and had imaging with no acute findings.  She states that throughout the day she has less pain in her arm however at nighttime she has worsened pain in her bicep area.  She has tried Tylenol without resolution.  She denies any numbness or tingling.  The history is provided by the patient.  Arm Pain Pertinent negatives include no abdominal pain.    Past Medical History:  Diagnosis Date   Arthritis    B knees.   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   History of osteoarthritis 07/21/2021   Body mass index (BMI) of 40.0-44.9 in adult ([place]) 10/25/2017   Class 3 obesity due to excess calories with serious comorbidity and body mass index (BMI) of 40.0 to 44.9 in adult 10/20/2016   Glucose intolerance (impaired glucose tolerance) 04/21/2016   Chronic renal insufficiency 09/09/2015   Glaucoma 02/09/2013   HTN (hypertension) 02/09/2013    Past Surgical History:  Procedure Laterality Date   CATARACT EXTRACTION Right 07-2014   CATARACT EXTRACTION Left 01/16/2016   COLONOSCOPY     10 +yrs ago in [place], normal per pt   VAGINAL DELIVERY     x2    OB History   No obstetric history on file.      Home Medications    Prior to Admission medications   Medication Sig Start Date End Date Taking? Authorizing Provider  predniSONE (DELTASONE) 20 MG tablet Take 2 tablets (40 mg total) by mouth daily with breakfast for 5  days. 09/01/22 09/06/22 Yes Francene Finders, PA-C  acetaminophen (TYLENOL) 500 MG tablet Take 500 mg by mouth every 8 (eight) hours as needed.    [provider]  amLODipine (NORVASC) 5 MG tablet TAKE 1 TABLET(5 MG) BY MOUTH DAILY 07/20/22   Horald Pollen, MD  diclofenac Sodium (VOLTAREN) 1 % GEL Apply 2 g topically 4 (four) times daily. 08/28/20   Wendie Agreste, MD  fluticasone (FLONASE) 50 MCG/ACT nasal spray Place 1 spray into both nostrils in the morning and at bedtime. Patient not taking: Reported on 01/27/2022 12/23/21   Geryl Councilman L, PA  ibuprofen (ADVIL) 200 MG tablet Take 200 mg by mouth every 6 (six) hours as needed. As needed    [provider]  olmesartan-hydrochlorothiazide (BENICAR HCT) 40-25 MG tablet TAKE 1 TABLET BY MOUTH DAILY 08/13/22   Horald Pollen, MD  Polyethyl Glycol-Propyl Glycol (SYSTANE HYDRATION PF OP) Apply to eye. Dry eyes    [provider]  ZIOPTAN 0.0015 % SOLN  09/26/16   [provider]    Family History Family History  Problem Relation Age of Onset   Diabetes Mother    Heart disease Mother        unsure   COPD Father    Diabetes Sister    Hypertension Sister  Kidney disease Sister    Diabetes Brother    Diabetes Brother    Colon cancer Neg Hx    Rectal cancer Neg Hx    Stomach cancer Neg Hx     Social History Social History   Tobacco Use   Smoking status: Never   Smokeless tobacco: Never  Substance Use Topics   Alcohol use: Yes    Alcohol/week: 6.0 standard drinks of alcohol    Types: 6 Standard drinks or equivalent per week    Comment: 2-3   Drug use: No     Allergies   Patient has no known allergies.   Review of Systems Review of Systems  Constitutional:  Negative for chills and fever.  Eyes:  Negative for discharge and redness.  Gastrointestinal:  Negative for abdominal pain, nausea and vomiting.  Musculoskeletal:  Positive for myalgias. Negative for arthralgias.  Skin:   Negative for color change and wound.  Neurological:  Negative for numbness.     Physical Exam Triage Vital Signs ED Triage Vitals  Enc Vitals Group     BP 09/01/22 0838 (!) 162/73     Pulse Rate 09/01/22 0838 96     Resp 09/01/22 0838 18     Temp 09/01/22 0838 97.7 F (36.5 C)     Temp Source 09/01/22 0838 Oral     SpO2 09/01/22 0838 95 %     Weight --      Height --      Head Circumference --      Peak Flow --      Pain Score 09/01/22 0839 4     Pain Loc --      Pain Edu? --      Excl. in [place]? --    No data found.  Updated Vital Signs BP (!) 162/73 (BP Location: Left Arm)   Pulse 96   Temp 97.7 F (36.5 C) (Oral)   Resp 18   SpO2 95%      Physical Exam Vitals and nursing note reviewed.  Constitutional:      General: She is not in acute distress.    Appearance: Normal appearance. She is not ill-appearing.  HENT:     Head: Normocephalic and atraumatic.  Eyes:     Conjunctiva/sclera: Conjunctivae normal.  Cardiovascular:     Rate and Rhythm: Normal rate.  Pulmonary:     Effort: Pulmonary effort is normal. No respiratory distress.  Musculoskeletal:     Comments: Mild TTP to right bicep with questionable bulging/ swelling compared to left   Neurological:     Mental Status: She is alert.  Psychiatric:        Mood and Affect: Mood normal.        Behavior: Behavior normal.        Thought Content: Thought content normal.      UC Treatments / Results  Labs (all labs ordered are listed, but only abnormal results are displayed) Labs Reviewed - No data to display  EKG   Radiology No results found.  Procedures Procedures (including critical care time)  Medications Ordered in UC Medications - No data to display  Initial Impression / Assessment and Plan / UC Course  I have reviewed the triage vital signs and the nursing notes.  Pertinent labs & imaging results that were available during my care of the patient were reviewed by me and considered in my  medical decision making (see chart for details).    Steroid burst prescribed to hopefully  help with any residual inflammation causing pain.  Questionable biceps tendon rupture versus other cause of pain.  Recommended further evaluation with Ortho if no gradual improvement with steroid burst.  Patient expresses understanding.  Final Clinical Impressions(s) / UC Diagnoses   Final diagnoses:  Right arm pain   Discharge Instructions   None    ED Prescriptions     Medication Sig Dispense Auth. Provider   predniSONE (DELTASONE) 20 MG tablet Take 2 tablets (40 mg total) by mouth daily with breakfast for 5 days. 10 tablet Francene Finders, PA-C      PDMP not reviewed this encounter.   Francene Finders, PA-C 09/01/22 [phone]

## 2022-09-03 DIAGNOSIS — M25561 Pain in right knee: Secondary | ICD-10-CM | POA: Diagnosis not present

## 2022-09-03 DIAGNOSIS — M170 Bilateral primary osteoarthritis of knee: Secondary | ICD-10-CM | POA: Diagnosis not present

## 2022-09-03 DIAGNOSIS — M25562 Pain in left knee: Secondary | ICD-10-CM | POA: Diagnosis not present

## 2022-09-09 DIAGNOSIS — M25561 Pain in right knee: Secondary | ICD-10-CM | POA: Diagnosis not present

## 2022-09-09 DIAGNOSIS — M170 Bilateral primary osteoarthritis of knee: Secondary | ICD-10-CM | POA: Diagnosis not present

## 2022-09-09 DIAGNOSIS — M25562 Pain in left knee: Secondary | ICD-10-CM | POA: Diagnosis not present

## 2022-09-10 ENCOUNTER — Ambulatory Visit (INDEPENDENT_AMBULATORY_CARE_PROVIDER_SITE_OTHER): Payer: Medicare Other | Admitting: Physician Assistant

## 2022-09-10 ENCOUNTER — Encounter: Payer: Self-pay | Admitting: Physician Assistant

## 2022-09-10 DIAGNOSIS — M1711 Unilateral primary osteoarthritis, right knee: Secondary | ICD-10-CM | POA: Diagnosis not present

## 2022-09-10 DIAGNOSIS — M1712 Unilateral primary osteoarthritis, left knee: Secondary | ICD-10-CM | POA: Diagnosis not present

## 2022-09-10 MED ORDER — METHYLPREDNISOLONE ACETATE 40 MG/ML IJ SUSP
40.0000 mg | INTRAMUSCULAR | Status: AC | PRN
  Administered 2022-09-10: 40 mg via INTRA_ARTICULAR

## 2022-09-10 MED ORDER — LIDOCAINE HCL 1 % IJ SOLN
3.0000 mL | INTRAMUSCULAR | Status: AC | PRN
  Administered 2022-09-10: 3 mL

## 2022-09-10 NOTE — Progress Notes (Signed)
   Procedure Note  Patient: Tammy Mccullough             Date of Birth: [birth year]           MRN: [id number]             Visit Date: 09/10/2022 HPI: Ms. Richardson Landry comes in today for bilateral knee pain.  She has known osteoarthritis both knees.  Last injections with cortisone were 04/24/2022.  States the injections helped.  She started having some discomfort in her knees over the last month.  She has been using Voltaren gel which has helped.  Left knee is more bothersome than the right.  She describes left knee is sometimes having a sharp pain when weightbearing.  She denies any falls or injuries.  She denies any fevers chills.  She has glucose intolerance but states she is not diabetic.  Review of systems: See HPI otherwise negative  Physical exam: General well-developed well-nourished female ambulates without any assistive device. Bilateral knees: Good range of motion of both knees.  No abnormal warmth erythema or effusion.  Procedures: Visit Diagnoses:  1. Primary osteoarthritis of right knee   2. Unilateral primary osteoarthritis, left knee     Large Joint Inj: bilateral knee on 09/10/2022 3:32 PM Indications: pain Details: 22 G 1.5 in needle, anterolateral approach  Arthrogram: No  Medications (Right): 3 mL lidocaine 1 %; 40 mg methylPREDNISolone acetate 40 MG/ML Medications (Left): 3 mL lidocaine 1 %; 40 mg methylPREDNISolone acetate 40 MG/ML Outcome: tolerated well, no immediate complications Procedure, treatment alternatives, risks and benefits explained, specific risks discussed. Consent was given by the patient. Immediately prior to procedure a time out was called to verify the correct patient, procedure, equipment, support staff and site/side marked as required. Patient was prepped and draped in the usual sterile fashion.     Plan: She will follow-up with [place] as needed.  She knows to wait at least 3 months between injections.  Questions were encouraged

## 2022-09-11 DIAGNOSIS — M170 Bilateral primary osteoarthritis of knee: Secondary | ICD-10-CM | POA: Diagnosis not present

## 2022-09-11 DIAGNOSIS — M25561 Pain in right knee: Secondary | ICD-10-CM | POA: Diagnosis not present

## 2022-09-11 DIAGNOSIS — M25562 Pain in left knee: Secondary | ICD-10-CM | POA: Diagnosis not present

## 2022-10-14 ENCOUNTER — Ambulatory Visit (INDEPENDENT_AMBULATORY_CARE_PROVIDER_SITE_OTHER): Payer: Medicare Other

## 2022-10-14 ENCOUNTER — Ambulatory Visit
Admission: EM | Admit: 2022-10-14 | Discharge: 2022-10-14 | Disposition: A | Discharge status: Home / Self Care | Payer: Medicare Other | Attending: Internal Medicine | Admitting: Internal Medicine

## 2022-10-14 DIAGNOSIS — M25511 Pain in right shoulder: Secondary | ICD-10-CM | POA: Diagnosis not present

## 2022-10-14 DIAGNOSIS — M19011 Primary osteoarthritis, right shoulder: Secondary | ICD-10-CM | POA: Diagnosis not present

## 2022-10-14 MED ORDER — LIDOCAINE 5 % EX PTCH: 1.0000 | MEDICATED_PATCH | CUTANEOUS | 0 refills | Status: DC

## 2022-10-14 NOTE — ED Triage Notes (Signed)
Pt c/o right shoulder pain says she went on a trip with a disabled person and had to help with ADLs and thinks she injured herself.   Onset ~ 4 days ago

## 2022-10-14 NOTE — ED Provider Notes (Signed)
[facility]    CSN: [id number] Arrival date & time: 10/14/22  0816      History   Chief Complaint Chief Complaint  Patient presents with   right shoulder pain    HPI Tammy Mccullough is a 82 y.o. female.   Patient presents with right shoulder pain that started about a week ago.  Patient reports that she went on a cruise with a friend who needed a lot assistance with ADLs.  She states that she was doing a lot of pushing, pulling, lifting with helping her friend's mobility.  Right shoulder pain started after that.  Although, she does report that she fell approximately 1 to 2 months ago on same arm and had negative x-rays.  Denies any numbness or tingling.  Has taken Tylenol for symptoms.     Past Medical History:  Diagnosis Date   Arthritis    B knees.   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      Home Medications    Prior to Admission medications   Medication Sig Start Date End Date Taking? Authorizing Provider  lidocaine (LIDODERM) 5 % Place 1 patch onto the skin daily. Remove & Discard patch within 12 hours or  as directed by MD 10/14/22  Yes Teodora Medici, FNP  acetaminophen (TYLENOL) 500 MG tablet Take 500 mg by mouth every 8 (eight) hours as needed.    [provider]  amLODipine (NORVASC) 5 MG tablet TAKE 1 TABLET(5 MG) BY MOUTH DAILY 07/20/22   Horald Pollen, MD  diclofenac Sodium (VOLTAREN) 1 % GEL Apply 2 g topically 4 (four) times daily. 08/28/20   Wendie Agreste, MD  fluticasone (FLONASE) 50 MCG/ACT nasal spray Place 1 spray into both nostrils in the morning and at bedtime. Patient not taking: Reported on 01/27/2022 12/23/21   Geryl Councilman L, PA  ibuprofen (ADVIL) 200 MG tablet Take 200 mg by mouth every 6 (six) hours as needed. As needed    [provider]  olmesartan-hydrochlorothiazide (BENICAR HCT) 40-25 MG tablet TAKE 1 TABLET BY MOUTH DAILY 08/13/22   Horald Pollen, MD  Polyethyl Glycol-Propyl Glycol (SYSTANE HYDRATION PF OP) Apply to eye. Dry eyes    [provider]  ZIOPTAN 0.0015 % SOLN  09/26/16   [provider]    Family History Family History  Problem Relation Age of Onset   Diabetes Mother    Heart disease Mother        unsure   COPD Father    Diabetes Sister  Hypertension Sister    Kidney disease Sister    Diabetes Brother    Diabetes Brother    Colon cancer Neg Hx    Rectal cancer Neg Hx    Stomach cancer Neg Hx     Social History Social History   Tobacco Use   Smoking status: Never   Smokeless tobacco: Never  Substance Use Topics   Alcohol use: Yes    Alcohol/week: 6.0 standard drinks of alcohol    Types: 6 Standard drinks or equivalent per week    Comment: 2-3   Drug use: No     Allergies   Patient has no known allergies.   Review of Systems Review of Systems Per HPI  Physical Exam Triage Vital Signs ED Triage Vitals  Enc Vitals Group     BP 10/14/22 0828 (!) 147/76     Pulse Rate 10/14/22 0828 99     Resp 10/14/22 0828 18     Temp 10/14/22 0828 98 F (36.7 C)     Temp Source 10/14/22  0828 Oral     SpO2 10/14/22 0828 98 %     Weight --      Height --      Head Circumference --      Peak Flow --      Pain Score 10/14/22 0829 9     Pain Loc --      Pain Edu? --      Excl. in [place]? --    No data found.  Updated Vital Signs BP (!) 147/76 (BP Location: Left Arm)   Pulse 99   Temp 98 F (36.7 C) (Oral)   Resp 18   SpO2 98%   Visual Acuity Right Eye Distance:   Left Eye Distance:   Bilateral Distance:    Right Eye Near:   Left Eye Near:    Bilateral Near:     Physical Exam Constitutional:      General: She is not in acute distress.    Appearance: Normal appearance. She is not toxic-appearing or diaphoretic.  HENT:     Head: Normocephalic and atraumatic.  Eyes:     Extraocular Movements: Extraocular movements intact.     Conjunctiva/sclera: Conjunctivae normal.  Pulmonary:     Effort: Pulmonary effort is normal.  Musculoskeletal:     Comments: Tenderness to palpation to posterior, lateral, anterior right shoulder.  There is no crepitus noted.  No lacerations, abrasions, swelling, discoloration noted.  Pain with movement of right shoulder with abduction at 45 degrees.  Grip strength is 5/5.  Neurovascular intact.  Neurological:     General: No focal deficit present.     Mental Status: She is alert and oriented to person, place, and time. Mental status is at baseline.  Psychiatric:        Mood and Affect: Mood normal.        Behavior: Behavior normal.        Thought Content: Thought content normal.        Judgment: Judgment normal.      UC Treatments / Results  Labs (all labs ordered are listed, but only abnormal results are displayed) Labs Reviewed - No data to display  EKG   Radiology DG Shoulder Right  Result Date: 10/14/2022 CLINICAL DATA:  Shoulder pain EXAM: RIGHT SHOULDER - 2+ VIEW COMPARISON:  None Available. FINDINGS: Glenohumeral joint is intact. No evidence of scapular fracture or humeral fracture. The acromioclavicular joint is  intact. Degenerate spurring of the glenoid  fossa rim. Spurring at the [facility] joint. IMPRESSION: 1. No fracture or dislocation. 2. Moderate osteoarthritis. Electronically Signed   By: Suzy Bouchard M.D.   On: 10/14/2022 09:20    Procedures Procedures (including critical care time)  Medications Ordered in UC Medications - No data to display  Initial Impression / Assessment and Plan / UC Course  I have reviewed the triage vital signs and the nursing notes.  Pertinent labs & imaging results that were available during my care of the patient were reviewed by me and considered in my medical decision making (see chart for details).     Right shoulder x-ray is showing osteoarthritis.  This could be contributing to patient's pain but also suspect muscular etiology given patient was helping her friend with mobility recently when pain started.  Limited options on pain management given patient cannot take NSAIDs due to kidney function and patient recently had prednisone and steroid shots.  Will prescribe lidocaine patch topically and advised Tylenol as needed.  Patient advised to follow-up with provided contact information for orthopedist given recurrent right shoulder pain recently.  Patient verbalized understanding and was agreeable with plan. Final Clinical Impressions(s) / UC Diagnoses   Final diagnoses:  Acute pain of right shoulder     Discharge Instructions      Your shoulder x-ray is showing arthritis.  I have prescribed a lidocaine patch to apply to area.  Follow-up with orthopedist for further evaluation and management.     ED Prescriptions     Medication Sig Dispense Auth. Provider   lidocaine (LIDODERM) 5 % Place 1 patch onto the skin daily. Remove & Discard patch within 12 hours or as directed by MD 30 patch [place], Michele Rockers, FNP      PDMP not reviewed this encounter.   Teodora Medici, [place] 10/14/22 1011

## 2022-10-14 NOTE — Discharge Instructions (Signed)
Your shoulder x-ray is showing arthritis.  I have prescribed a lidocaine patch to apply to area.  Follow-up with orthopedist for further evaluation and management.

## 2022-10-22 ENCOUNTER — Encounter: Payer: Self-pay | Admitting: Radiology

## 2022-10-27 ENCOUNTER — Ambulatory Visit (INDEPENDENT_AMBULATORY_CARE_PROVIDER_SITE_OTHER): Payer: Medicare Other | Admitting: Sports Medicine

## 2022-10-27 ENCOUNTER — Ambulatory Visit: Payer: Self-pay

## 2022-10-27 ENCOUNTER — Encounter: Payer: Self-pay | Admitting: Physician Assistant

## 2022-10-27 ENCOUNTER — Ambulatory Visit (INDEPENDENT_AMBULATORY_CARE_PROVIDER_SITE_OTHER): Payer: Medicare Other | Admitting: Physician Assistant

## 2022-10-27 DIAGNOSIS — M25511 Pain in right shoulder: Secondary | ICD-10-CM | POA: Diagnosis not present

## 2022-10-27 DIAGNOSIS — G8929 Other chronic pain: Secondary | ICD-10-CM

## 2022-10-27 DIAGNOSIS — M19011 Primary osteoarthritis, right shoulder: Secondary | ICD-10-CM

## 2022-10-27 MED ORDER — LIDOCAINE HCL 1 % IJ SOLN
2.0000 mL | INTRAMUSCULAR | Status: AC | PRN
  Administered 2022-10-27: 2 mL

## 2022-10-27 MED ORDER — METHYLPREDNISOLONE ACETATE 40 MG/ML IJ SUSP
40.0000 mg | INTRAMUSCULAR | Status: AC | PRN
  Administered 2022-10-27: 40 mg via INTRA_ARTICULAR

## 2022-10-27 MED ORDER — BUPIVACAINE HCL 0.25 % IJ SOLN
2.0000 mL | INTRAMUSCULAR | Status: AC | PRN
  Administered 2022-10-27: 2 mL via INTRA_ARTICULAR

## 2022-10-27 NOTE — Progress Notes (Signed)
HPI: Tammy Mccullough comes in today with right shoulder pain.  She states that she had shoulder pain after helping lift a friend from the chair while on the vacation she did this for 11 days.  This was over 3 weeks ago.  Had some pain in the biceps bilaterally now having right shoulder pain.  Minimal discomfort in the left shoulder.  She denies any radicular symptoms down either arm.  She does note that she has trouble bringing her right arm above her head marijuana.  She is using lidocaine patch on the shoulder.  She denies any neck pain.  She has had no treatment for this.  She did undergo radiographs of her shoulder right shoulder on 10/14/2022.  I personally reviewed these.  Radiographs show the shoulder to be well located.  No acute fracture or bony abnormalities.  Acromioclavicular joint arthritic changes.  Glenohumeral joint with cystic change within the humeral head.  Moderate glenohumeral changes.  Review of systems: Negative for fevers chills.  Physical exam: General well-developed well-nourished female in no acute distress.  Mood and affect appropriate Bilateral shoulders she has full active overhead range of motion of the left shoulder without pain.  Right shoulder she ratchet up but is able to get above her head with some difficulty and some pain.  Internal/external rotation bilateral shoulders reveals crepitus right greater than left.  She has weakness with external rotation of the right shoulder against resistance.  Liftoff test is positive on the right empty can test is also positive on the right.  Full strength with strength testing on the left.  Nontender over the biceps bilaterally.  Nontender distal biceps tendon and biceps tendons are intact distally.   Impression: Right shoulder osteoarthritis  Plan: Recommend intra-articular injection of the right shoulder this scheduled for later today with Dr. Rolena Infante under ultrasound.  She is also given a prescription for physical therapy to work on  range of motion strengthening bilateral shoulders.  Questions were encouraged and answered at length.  She will follow-up with in 4 weeks to see how she is doing overall.  Questions were encouraged and answered at length.

## 2022-10-27 NOTE — Progress Notes (Signed)
   Procedure Note  Patient: Tammy Mccullough             Date of Birth: 1941/05/19           MRN: [id number]             Visit Date: 10/27/2022  Procedures: Visit Diagnoses:  1. Chronic right shoulder pain   2. Primary osteoarthritis of right shoulder    Large Joint Inj: R glenohumeral on 10/27/2022 10:29 AM Indications: pain Details: 22 G 3.5 in needle, ultrasound-guided posterior approach Medications: 2 mL lidocaine 1 %; 2 mL bupivacaine 0.25 %; 40 mg methylPREDNISolone acetate 40 MG/ML Outcome: tolerated well, no immediate complications  US-guided glenohumeral joint injection, right shoulder After discussion on risks/benefits/indications, informed verbal consent was obtained. A timeout was then performed. The patient was positioned lying lateral recumbent on examination table. The patient's shoulder was prepped with betadine and multiple alcohol swabs and utilizing ultrasound guidance, the patient's glenohumeral joint was identified on ultrasound. Using ultrasound guidance a 22-gauge, 3.5 inch needle with a mixture of 2:2:1 cc's lidocaine:bupivicaine:depomedrol was directed from a lateral to medial direction via in-plane technique into the glenohumeral joint with visualization of appropriate spread of injectate into the joint. Patient tolerated the procedure well without immediate complications.      Procedure, treatment alternatives, risks and benefits explained, specific risks discussed. Consent was given by the patient. Immediately prior to procedure a time out was called to verify the correct patient, procedure, equipment, support staff and site/side marked as required. Patient was prepped and draped in the usual sterile fashion.    - I evaluated the patient about 10 minutes post-injection and she had good improvement in pain and range of motion - follow-up with Benita Stabile as indicated; I am happy to see them as needed  Elba Barman, DO [place]  This note was dictated using Dragon naturally speaking software and may contain errors in syntax, spelling, or content which have not been identified prior to signing this note.

## 2022-10-28 DIAGNOSIS — M6281 Muscle weakness (generalized): Secondary | ICD-10-CM | POA: Diagnosis not present

## 2022-10-28 DIAGNOSIS — M25511 Pain in right shoulder: Secondary | ICD-10-CM | POA: Diagnosis not present

## 2022-10-30 DIAGNOSIS — M25511 Pain in right shoulder: Secondary | ICD-10-CM | POA: Diagnosis not present

## 2022-10-30 DIAGNOSIS — M6281 Muscle weakness (generalized): Secondary | ICD-10-CM | POA: Diagnosis not present

## 2022-11-02 DIAGNOSIS — M25511 Pain in right shoulder: Secondary | ICD-10-CM | POA: Diagnosis not present

## 2022-11-02 DIAGNOSIS — M6281 Muscle weakness (generalized): Secondary | ICD-10-CM | POA: Diagnosis not present

## 2022-11-06 DIAGNOSIS — M25511 Pain in right shoulder: Secondary | ICD-10-CM | POA: Diagnosis not present

## 2022-11-06 DIAGNOSIS — M6281 Muscle weakness (generalized): Secondary | ICD-10-CM | POA: Diagnosis not present

## 2022-11-10 DIAGNOSIS — M25511 Pain in right shoulder: Secondary | ICD-10-CM | POA: Diagnosis not present

## 2022-11-10 DIAGNOSIS — M6281 Muscle weakness (generalized): Secondary | ICD-10-CM | POA: Diagnosis not present

## 2022-11-13 DIAGNOSIS — M6281 Muscle weakness (generalized): Secondary | ICD-10-CM | POA: Diagnosis not present

## 2022-11-13 DIAGNOSIS — M25511 Pain in right shoulder: Secondary | ICD-10-CM | POA: Diagnosis not present

## 2022-11-18 DIAGNOSIS — M25511 Pain in right shoulder: Secondary | ICD-10-CM | POA: Diagnosis not present

## 2022-11-18 DIAGNOSIS — M6281 Muscle weakness (generalized): Secondary | ICD-10-CM | POA: Diagnosis not present

## 2022-11-20 DIAGNOSIS — M6281 Muscle weakness (generalized): Secondary | ICD-10-CM | POA: Diagnosis not present

## 2022-11-20 DIAGNOSIS — M25511 Pain in right shoulder: Secondary | ICD-10-CM | POA: Diagnosis not present

## 2022-11-25 DIAGNOSIS — M6281 Muscle weakness (generalized): Secondary | ICD-10-CM | POA: Diagnosis not present

## 2022-11-25 DIAGNOSIS — M25511 Pain in right shoulder: Secondary | ICD-10-CM | POA: Diagnosis not present

## 2022-12-02 DIAGNOSIS — M6281 Muscle weakness (generalized): Secondary | ICD-10-CM | POA: Diagnosis not present

## 2022-12-02 DIAGNOSIS — M25511 Pain in right shoulder: Secondary | ICD-10-CM | POA: Diagnosis not present

## 2022-12-03 DIAGNOSIS — N183 Chronic kidney disease, stage 3 unspecified: Secondary | ICD-10-CM | POA: Diagnosis not present

## 2022-12-03 DIAGNOSIS — I129 Hypertensive chronic kidney disease with stage 1 through stage 4 chronic kidney disease, or unspecified chronic kidney disease: Secondary | ICD-10-CM | POA: Diagnosis not present

## 2022-12-04 DIAGNOSIS — M6281 Muscle weakness (generalized): Secondary | ICD-10-CM | POA: Diagnosis not present

## 2022-12-04 DIAGNOSIS — M25511 Pain in right shoulder: Secondary | ICD-10-CM | POA: Diagnosis not present

## 2022-12-07 DIAGNOSIS — N1419 Nephropathy induced by other drugs, medicaments and biological substances: Secondary | ICD-10-CM | POA: Diagnosis not present

## 2022-12-07 DIAGNOSIS — N183 Chronic kidney disease, stage 3 unspecified: Secondary | ICD-10-CM | POA: Diagnosis not present

## 2022-12-07 DIAGNOSIS — I129 Hypertensive chronic kidney disease with stage 1 through stage 4 chronic kidney disease, or unspecified chronic kidney disease: Secondary | ICD-10-CM | POA: Diagnosis not present

## 2022-12-07 DIAGNOSIS — E559 Vitamin D deficiency, unspecified: Secondary | ICD-10-CM | POA: Diagnosis not present

## 2022-12-07 DIAGNOSIS — T39395A Adverse effect of other nonsteroidal anti-inflammatory drugs [NSAID], initial encounter: Secondary | ICD-10-CM | POA: Diagnosis not present

## 2022-12-07 DIAGNOSIS — M898X9 Other specified disorders of bone, unspecified site: Secondary | ICD-10-CM | POA: Diagnosis not present

## 2022-12-09 DIAGNOSIS — M25511 Pain in right shoulder: Secondary | ICD-10-CM | POA: Diagnosis not present

## 2022-12-09 DIAGNOSIS — M6281 Muscle weakness (generalized): Secondary | ICD-10-CM | POA: Diagnosis not present

## 2022-12-11 DIAGNOSIS — M6281 Muscle weakness (generalized): Secondary | ICD-10-CM | POA: Diagnosis not present

## 2022-12-11 DIAGNOSIS — M25511 Pain in right shoulder: Secondary | ICD-10-CM | POA: Diagnosis not present

## 2022-12-14 ENCOUNTER — Ambulatory Visit (INDEPENDENT_AMBULATORY_CARE_PROVIDER_SITE_OTHER): Payer: Medicare Other | Admitting: Physician Assistant

## 2022-12-14 ENCOUNTER — Encounter: Payer: Self-pay | Admitting: Physician Assistant

## 2022-12-14 DIAGNOSIS — M1711 Unilateral primary osteoarthritis, right knee: Secondary | ICD-10-CM | POA: Diagnosis not present

## 2022-12-14 DIAGNOSIS — M1712 Unilateral primary osteoarthritis, left knee: Secondary | ICD-10-CM | POA: Diagnosis not present

## 2022-12-14 MED ORDER — LIDOCAINE HCL 1 % IJ SOLN
3.0000 mL | INTRAMUSCULAR | Status: AC | PRN
Start: 2022-12-14 — End: 2022-12-14
  Administered 2022-12-14: 3 mL

## 2022-12-14 MED ORDER — METHYLPREDNISOLONE ACETATE 40 MG/ML IJ SUSP
40.0000 mg | INTRAMUSCULAR | Status: AC | PRN
Start: 2022-12-14 — End: 2022-12-14
  Administered 2022-12-14: 40 mg via INTRA_ARTICULAR

## 2022-12-14 NOTE — Progress Notes (Signed)
   Procedure Note  Patient: Tammy Mccullough             Date of Birth: 1941-04-16           MRN: [id number]             Visit Date: 12/14/2022 HPI: Mrs. Seith comes in today requesting injections both knees.  She was last seen on 09/10/2022 was given injections both knees.  States that pain returned about a month ago.  She is using Voltaren gel on her knees and feels this beneficial.  She is nondiabetic.  She denies any fevers chills.  Denies any new injuries.  She has known osteoarthritis both knees. Procedures: Visit Diagnoses:  1. Unilateral primary osteoarthritis, left knee   2. Primary osteoarthritis of right knee     Large Joint Inj: bilateral knee on 12/14/2022 5:13 PM Indications: pain Details: 22 G 1.5 in needle, anterolateral approach  Arthrogram: No  Medications (Right): 3 mL lidocaine 1 %; 40 mg methylPREDNISolone acetate 40 MG/ML Medications (Left): 3 mL lidocaine 1 %; 40 mg methylPREDNISolone acetate 40 MG/ML Outcome: tolerated well, no immediate complications Procedure, treatment alternatives, risks and benefits explained, specific risks discussed. Consent was given by the patient. Immediately prior to procedure a time out was called to verify the correct patient, procedure, equipment, support staff and site/side marked as required. Patient was prepped and draped in the usual sterile fashion.     Plan: She knows to wait 3 months between injections.  She will follow-up with [place] as needed.  Questions were encouraged and answered.

## 2022-12-29 ENCOUNTER — Other Ambulatory Visit: Payer: Self-pay

## 2022-12-29 ENCOUNTER — Telehealth: Payer: Self-pay | Admitting: Physician Assistant

## 2022-12-29 DIAGNOSIS — M1712 Unilateral primary osteoarthritis, left knee: Secondary | ICD-10-CM

## 2022-12-29 DIAGNOSIS — M1711 Unilateral primary osteoarthritis, right knee: Secondary | ICD-10-CM

## 2022-12-29 NOTE — Telephone Encounter (Signed)
Patient asking for a referral to [facility]. Please advise patient and leave message

## 2023-01-15 DIAGNOSIS — M25561 Pain in right knee: Secondary | ICD-10-CM | POA: Diagnosis not present

## 2023-01-15 DIAGNOSIS — M25562 Pain in left knee: Secondary | ICD-10-CM | POA: Diagnosis not present

## 2023-01-18 DIAGNOSIS — M25562 Pain in left knee: Secondary | ICD-10-CM | POA: Diagnosis not present

## 2023-01-18 DIAGNOSIS — M25561 Pain in right knee: Secondary | ICD-10-CM | POA: Diagnosis not present

## 2023-01-20 DIAGNOSIS — M25561 Pain in right knee: Secondary | ICD-10-CM | POA: Diagnosis not present

## 2023-01-20 DIAGNOSIS — M25562 Pain in left knee: Secondary | ICD-10-CM | POA: Diagnosis not present

## 2023-01-21 ENCOUNTER — Other Ambulatory Visit: Payer: Self-pay | Admitting: Emergency Medicine

## 2023-01-21 DIAGNOSIS — Z1231 Encounter for screening mammogram for malignant neoplasm of breast: Secondary | ICD-10-CM

## 2023-01-27 DIAGNOSIS — M25561 Pain in right knee: Secondary | ICD-10-CM | POA: Diagnosis not present

## 2023-01-27 DIAGNOSIS — M25562 Pain in left knee: Secondary | ICD-10-CM | POA: Diagnosis not present

## 2023-01-28 ENCOUNTER — Ambulatory Visit: Payer: Medicare Other | Admitting: Emergency Medicine

## 2023-02-02 ENCOUNTER — Ambulatory Visit (INDEPENDENT_AMBULATORY_CARE_PROVIDER_SITE_OTHER): Payer: Medicare Other | Admitting: Emergency Medicine

## 2023-02-02 ENCOUNTER — Encounter: Payer: Self-pay | Admitting: Emergency Medicine

## 2023-02-02 VITALS — BP 130/74 | HR 91 | Temp 97.9°F | Ht 64.0 in | Wt 207.8 lb

## 2023-02-02 DIAGNOSIS — N1832 Chronic kidney disease, stage 3b: Secondary | ICD-10-CM

## 2023-02-02 DIAGNOSIS — N1419 Nephropathy induced by other drugs, medicaments and biological substances: Secondary | ICD-10-CM

## 2023-02-02 DIAGNOSIS — T39395A Adverse effect of other nonsteroidal anti-inflammatory drugs [NSAID], initial encounter: Secondary | ICD-10-CM

## 2023-02-02 DIAGNOSIS — I10 Essential (primary) hypertension: Secondary | ICD-10-CM

## 2023-02-02 LAB — COMPREHENSIVE METABOLIC PANEL
ALT: 11 U/L (ref 0–35)
AST: 20 U/L (ref 0–37)
Albumin: 4.1 g/dL (ref 3.5–5.2)
Alkaline Phosphatase: 53 U/L (ref 39–117)
BUN: 31 mg/dL — ABNORMAL HIGH (ref 6–23)
CO2: 25 mEq/L (ref 19–32)
Calcium: 9.7 mg/dL (ref 8.4–10.5)
Chloride: 104 mEq/L (ref 96–112)
Creatinine, Ser: 1.62 mg/dL — ABNORMAL HIGH (ref 0.40–1.20)
GFR: 29.09 mL/min — ABNORMAL LOW (ref >60.00)
Glucose, Bld: 97 mg/dL (ref 70–99)
Potassium: 4.5 mEq/L (ref 3.5–5.1)
Sodium: 139 mEq/L (ref 135–145)
Total Bilirubin: 1.1 mg/dL (ref 0.2–1.2)
Total Protein: 7.4 g/dL (ref 6.0–8.3)

## 2023-02-02 LAB — LIPID PANEL
Cholesterol: 198 mg/dL (ref 0–200)
HDL: 74.2 mg/dL (ref >39.00)
LDL Cholesterol: 105 mg/dL — ABNORMAL HIGH (ref 0–99)
NonHDL: 123.52
Total CHOL/HDL Ratio: 3
Triglycerides: 94 mg/dL (ref 0.0–149.0)
VLDL: 18.8 mg/dL (ref 0.0–40.0)

## 2023-02-02 NOTE — Progress Notes (Signed)
Tammy Mccullough 82 y.o.   Chief Complaint  Patient presents with   Hypertension    6 month follow up.     HISTORY OF PRESENT ILLNESS: This is a 82 y.o. female here for 9-month follow-up of hypertension Has history of chronic kidney disease.  Sees nephrologist on a regular basis Overall doing well.  Has no complaints or medical concerns today. BP Readings from Last 3 Encounters:  02/02/23 130/74  10/14/22 (!) 147/76  09/01/22 (!) 162/73   Wt Readings from Last 3 Encounters:  02/02/23 207 lb 12.8 oz (94.3 kg)  07/29/22 218 lb (98.9 kg)  05/06/22 210 lb (95.3 kg)     Hypertension Pertinent negatives include no chest pain, headaches, palpitations or shortness of breath.     Prior to Admission medications   Medication Sig Start Date End Date Taking? Authorizing Provider  acetaminophen (TYLENOL) 500 MG tablet Take 500 mg by mouth every 8 (eight) hours as needed.   Yes [provider]  amLODipine (NORVASC) 5 MG tablet TAKE 1 TABLET(5 MG) BY MOUTH DAILY 07/20/22  Yes Amali Uhls, Eilleen Kempf, MD  diclofenac Sodium (VOLTAREN) 1 % GEL Apply 2 g topically 4 (four) times daily. 08/28/20  Yes Shade Flood, MD  olmesartan-hydrochlorothiazide [facility] HCT) 40-25 MG tablet TAKE 1 TABLET BY MOUTH DAILY 08/13/22  Yes Alesandra Smart, Eilleen Kempf, MD  Polyethyl Glycol-Propyl Glycol (SYSTANE HYDRATION PF OP) Apply to eye. 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   Cataract    Glaucoma    Glucose intolerance (impaired glucose tolerance)    Hypertension    Renal insufficiency     Past Surgical History:  Procedure Laterality Date   CATARACT EXTRACTION Right 07-2014   CATARACT EXTRACTION Left 01/16/2016   COLONOSCOPY     10 +yrs ago in [place], normal per pt   VAGINAL DELIVERY     x2    Social History   Socioeconomic History   Marital status: Widowed    Spouse name: Not on file   Number of children: 2   Years of education: Not on file   Highest education level: Not on file  Occupational History   Occupation: retired  Tobacco Use   Smoking status: Never   Smokeless tobacco: Never  Substance and Sexual Activity   Alcohol use: Yes    Alcohol/week: 6.0 standard drinks of alcohol    Types: 6 Standard drinks or equivalent per week    Comment: 2-3   Drug use: No   Sexual activity: Not Currently  Other Topics Concern   Not on file  Social History Narrative   Marital status: widowed since 2010 after 15 years of marriage.  Not dating but would like to be.      Children:  2 Children; no grandchildren; 2 adopted  grandchildren.  One son is disabled; patient supports; son is obese.      Living: alone with 1 cat.      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      Tobacco: never      Alcohol: weekends liquor; some during the week.  3-5 cups per week.      Drugs: none      Exercise: water aerobics three days per week.  [employer].      Seatbelt: 100%;      Guns: none      ADLs:: independent with ADLs.   Drives. No assistant devices.      Advanced Directives: yes; healthcare POA:  Older brother Lorella Nimrod [place]);  FULL CODE but no prolonged measures.     Social Determinants of Health   Financial Resource Strain: Low Risk  (05/06/2022)    Overall Financial Resource Strain (CARDIA)    Difficulty of Paying Living Expenses: Not hard at all  Food Insecurity: No Food Insecurity (05/06/2022)   Hunger Vital Sign    Worried About Running Out of Food in the Last Year: Never true    Ran Out of Food in the Last Year: Never true  Transportation Needs: No Transportation Needs (05/06/2022)   PRAPARE - Administrator, Civil Service (Medical): No    Lack of Transportation (Non-Medical): No  Physical Activity: Inactive (05/06/2022)   Exercise Vital Sign    Days of Exercise per Week: 0 days    Minutes of Exercise per Session: 0 min  Stress: No Stress Concern Present (05/06/2022)   [employer] of Occupational Health - Occupational Stress Questionnaire    Feeling of Stress : Not at all  Social Connections: Moderately Integrated (05/06/2022)   Social Connection and Isolation Panel [NHANES]    Frequency of Communication with Friends and Family: More than three times a week    Frequency of Social Gatherings with Friends and Family: More than three times a week    Attends Religious Services: More than 4 times per year    Active Member of [employer] or Organizations: Yes    Attends Banker Meetings: More than 4 times per year    Marital Status: Widowed  Intimate Partner Violence: Not At Risk (05/06/2022)   Humiliation, Afraid, Rape, and Kick questionnaire    Fear of Current or Ex-Partner: No    Emotionally Abused: No    Physically Abused: No    Sexually Abused: No    Family History  Problem Relation Age of Onset   Diabetes Mother    Heart disease Mother        unsure   COPD Father    Diabetes Sister    Hypertension Sister    Kidney disease Sister    Diabetes Brother    Diabetes Brother    Colon cancer Neg Hx    Rectal cancer Neg Hx    Stomach cancer Neg Hx      Review of Systems  Constitutional: Negative.  Negative for chills and fever.  HENT: Negative.  Negative for congestion and sore throat.    Respiratory: Negative.  Negative for cough and shortness of breath.   Cardiovascular: Negative.  Negative for chest pain and palpitations.  Gastrointestinal:  Negative for abdominal pain, nausea and vomiting.  Genitourinary: Negative.  Negative for dysuria and hematuria.  Skin: Negative.  Negative for rash.  Neurological: Negative.  Negative for dizziness and headaches.  Vitals:   02/02/23 0830  BP: 130/74  Pulse: 91  Temp: 97.9 F (36.6 C)  SpO2: 97%    Physical Exam Vitals reviewed.  Constitutional:      Appearance: Normal appearance. She is obese.  HENT:     Head: Normocephalic.  Eyes:     Extraocular Movements: Extraocular movements intact.     Pupils: Pupils are equal, round, and reactive to light.  Cardiovascular:     Rate and Rhythm: Normal rate and regular rhythm.     Pulses: Normal pulses.     Heart sounds: Normal heart sounds.  Pulmonary:     Effort: Pulmonary effort is normal.     Breath sounds: Normal breath sounds.  Abdominal:     Palpations: Abdomen is soft.     Tenderness: There is no abdominal tenderness.  Musculoskeletal:     Cervical back: No tenderness.  Lymphadenopathy:     Cervical: No cervical adenopathy.  Skin:    General: Skin is warm and dry.     Capillary Refill: Capillary refill takes less than 2 seconds.  Neurological:     General: No focal deficit present.     Mental Status: She is alert and oriented to person, place, and time.  Psychiatric:        Mood and Affect: Mood normal.        Behavior: Behavior normal.      ASSESSMENT & PLAN: A total of 43 minutes was spent with the patient and counseling/coordination of care regarding preparing for this visit, review of most recent office visit notes, review of multiple chronic medical conditions and their treatment, review of all medications, review of most recent blood work results, review of health maintenance items, education on nutrition, cardiovascular risks associated with  hypertension, education on nutrition, documentation, prognosis and need for follow-up.  Problem List Items Addressed This Visit       Cardiovascular and Mediastinum   HTN (hypertension) - Primary    Well-controlled hypertension. 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 The arteries are the blood vessels that carry blood from the heart throughout the body. Hypertension forces the heart to work harder to pump blood and may cause arteries to become narrow or stiff. Untreated or uncontrolled hypertension can lead to a heart attack, heart failure, a stroke, kidney disease, and other problems. A blood pressure reading consists of a higher number over a lower number. Ideally, your blood pressure should be below 120/80. The first ("top") number is called the systolic pressure. It is a measure of the pressure in your arteries as your heart beats. The second ("bottom") number is called the diastolic pressure. It is a measure of the pressure in your arteries as the heart relaxes. What are the causes? The exact cause of  this condition is not known. There are some conditions that result in high blood pressure. What increases the risk? Certain factors may make you more likely to develop high blood pressure. Some of  these risk factors are under your control, including: Smoking. Not getting enough exercise or physical activity. Being overweight. Having too much fat, sugar, calories, or salt (sodium) in your diet. Drinking too much alcohol. Other risk factors include: Having a personal history of heart disease, diabetes, high cholesterol, or kidney disease. Stress. Having a family history of high blood pressure and high cholesterol. Having obstructive sleep apnea. Age. The risk increases with age. What are the signs or symptoms? High blood pressure may not cause symptoms. Very high blood pressure (hypertensive crisis) may cause: Headache. Fast or irregular heartbeats (palpitations). Shortness of breath. Nosebleed. Nausea and vomiting. Vision changes. Severe chest pain, dizziness, and seizures. How is this diagnosed? This condition is diagnosed by measuring your blood pressure while you are seated, with your arm resting on a flat surface, your legs uncrossed, and your feet flat on the floor. The cuff of the blood pressure monitor will be placed directly against the skin of your upper arm at the level of your heart. Blood pressure should be measured at least twice using the same arm. Certain conditions can cause a difference in blood pressure between your right and left arms. If you have a high blood pressure reading during one visit or you have normal blood pressure with other risk factors, you may be asked to: Return on a different day to have your blood pressure checked again. Monitor your blood pressure at home for 1 week or longer. If you are diagnosed with hypertension, you may have other blood or imaging tests to help your health care provider understand your overall risk for other conditions. How is this treated? This condition is treated by making healthy lifestyle changes, such as eating healthy foods, exercising more, and reducing your alcohol intake. You may be referred for counseling on a  healthy diet and physical activity. Your health care provider may prescribe medicine if lifestyle changes are not enough to get your blood pressure under control and if: Your systolic blood pressure is above 130. Your diastolic blood pressure is above 80. Your personal target blood pressure may vary depending on your medical conditions, your age, and other factors. Follow these instructions at home: Eating and drinking  Eat a diet that is high in fiber and potassium, and low in sodium, added sugar, and fat. An example of this eating plan is called the DASH diet. DASH stands for Dietary Approaches to Stop Hypertension. To eat this way: Eat plenty of fresh fruits and vegetables. Try to fill one half of your plate at each meal with fruits and vegetables. Eat whole grains, such as whole-wheat pasta, brown rice, or whole-grain bread. Fill about one fourth of your plate with whole grains. Eat or drink low-fat dairy products, such as skim milk or low-fat yogurt. Avoid fatty cuts of meat, processed or cured meats, and poultry with skin. Fill about one fourth of your plate with lean proteins, such as fish, chicken without skin, beans, eggs, or tofu. Avoid pre-made and processed foods. These tend to be higher in sodium, added sugar, and fat. Reduce your daily sodium intake. Many people with hypertension should eat less than 1,500 mg of sodium a day. Do not drink alcohol if: Your health care provider tells you not to drink. You are pregnant, may be pregnant, or are  planning to become pregnant. If you drink alcohol: Limit how much you have to: 0-1 drink a day for women. 0-2 drinks a day for men. Know how much alcohol is in your drink. In the [place] one drink equals one 12 oz bottle of beer (355 mL), one 5 oz glass of wine (148 mL), or one 1 oz glass of hard liquor (44 mL). Lifestyle  Work with your health care provider to maintain a healthy body weight or to lose weight. Ask what an ideal weight is for  you. Get at least 30 minutes of exercise that causes your heart to beat faster (aerobic exercise) most days of the week. Activities may include walking, swimming, or biking. Include exercise to strengthen your muscles (resistance exercise), such as Pilates or lifting weights, as part of your weekly exercise routine. Try to do these types of exercises for 30 minutes at least 3 days a week. Do not use any products that contain nicotine or tobacco. These products include cigarettes, chewing tobacco, and vaping devices, such as e-cigarettes. If you need help quitting, ask your health care provider. Monitor your blood pressure at home as told by your health care provider. Keep all follow-up visits. This is important. Medicines Take over-the-counter and prescription medicines only as told by your health care provider. Follow directions carefully. Blood pressure medicines must be taken as prescribed. Do not skip doses of blood pressure medicine. Doing this puts you at risk for problems and can make the medicine less effective. Ask your health care provider about side effects or reactions to medicines that you should watch for. Contact a health care provider if you: Think you are having a reaction to a medicine you are taking. Have headaches that keep coming back (recurring). Feel dizzy. Have swelling in your ankles. Have trouble with your vision. Get help right away if you: Develop a severe headache or confusion. Have unusual weakness or numbness. Feel faint. Have severe pain in your chest or abdomen. Vomit repeatedly. Have trouble breathing. These symptoms may be an emergency. Get help right away. Call 911. Do not wait to see if the symptoms will go away. Do not drive yourself to the hospital. Summary Hypertension is when the force of blood pumping through your arteries is too strong. If this condition is not controlled, it may put you at risk for serious complications. Your personal target  blood pressure may vary depending on your medical conditions, your age, and other factors. For most people, a normal blood pressure is less than 120/80. Hypertension is treated with lifestyle changes, medicines, or a combination of both. Lifestyle changes include losing weight, eating a healthy, low-sodium diet, exercising more, and limiting alcohol. This information is not intended to replace advice given to you by your health care provider. Make sure you discuss any questions you have with your health care provider. Document Revised: 06/10/2021 Document Reviewed: 06/10/2021 Elsevier Patient Education  2024 Elsevier Inc.      Edwina Barth, MD [facility]

## 2023-02-02 NOTE — Patient Instructions (Signed)
Hypertension, Adult High blood pressure (hypertension) is when the force of blood pumping through the arteries is too strong. The arteries are the blood vessels that carry blood from the heart throughout the body. Hypertension forces the heart to work harder to pump blood and may cause arteries to become narrow or stiff. Untreated or uncontrolled hypertension can lead to a heart attack, heart failure, a stroke, kidney disease, and other problems. A blood pressure reading consists of a higher number over a lower number. Ideally, your blood pressure should be below 120/80. The first ("top") number is called the systolic pressure. It is a measure of the pressure in your arteries as your heart beats. The second ("bottom") number is called the diastolic pressure. It is a measure of the pressure in your arteries as the heart relaxes. What are the causes? The exact cause of this condition is not known. There are some conditions that result in high blood pressure. What increases the risk? Certain factors may make you more likely to develop high blood pressure. Some of these risk factors are under your control, including: Smoking. Not getting enough exercise or physical activity. Being overweight. Having too much fat, sugar, calories, or salt (sodium) in your diet. Drinking too much alcohol. Other risk factors include: Having a personal history of heart disease, diabetes, high cholesterol, or kidney disease. Stress. Having a family history of high blood pressure and high cholesterol. Having obstructive sleep apnea. Age. The risk increases with age. What are the signs or symptoms? High blood pressure may not cause symptoms. Very high blood pressure (hypertensive crisis) may cause: Headache. Fast or irregular heartbeats (palpitations). Shortness of breath. Nosebleed. Nausea and vomiting. Vision changes. Severe chest pain, dizziness, and seizures. How is this diagnosed? This condition is diagnosed by  measuring your blood pressure while you are seated, with your arm resting on a flat surface, your legs uncrossed, and your feet flat on the floor. The cuff of the blood pressure monitor will be placed directly against the skin of your upper arm at the level of your heart. Blood pressure should be measured at least twice using the same arm. Certain conditions can cause a difference in blood pressure between your right and left arms. If you have a high blood pressure reading during one visit or you have normal blood pressure with other risk factors, you may be asked to: Return on a different day to have your blood pressure checked again. Monitor your blood pressure at home for 1 week or longer. If you are diagnosed with hypertension, you may have other blood or imaging tests to help your health care provider understand your overall risk for other conditions. How is this treated? This condition is treated by making healthy lifestyle changes, such as eating healthy foods, exercising more, and reducing your alcohol intake. You may be referred for counseling on a healthy diet and physical activity. Your health care provider may prescribe medicine if lifestyle changes are not enough to get your blood pressure under control and if: Your systolic blood pressure is above 130. Your diastolic blood pressure is above 80. Your personal target blood pressure may vary depending on your medical conditions, your age, and other factors. Follow these instructions at home: Eating and drinking  Eat a diet that is high in fiber and potassium, and low in sodium, added sugar, and fat. An example of this eating plan is called the DASH diet. DASH stands for Dietary Approaches to Stop Hypertension. To eat this way: Eat   plenty of fresh fruits and vegetables. Try to fill one half of your plate at each meal with fruits and vegetables. Eat whole grains, such as whole-wheat pasta, brown rice, or whole-grain bread. Fill about one  fourth of your plate with whole grains. Eat or drink low-fat dairy products, such as skim milk or low-fat yogurt. Avoid fatty cuts of meat, processed or cured meats, and poultry with skin. Fill about one fourth of your plate with lean proteins, such as fish, chicken without skin, beans, eggs, or tofu. Avoid pre-made and processed foods. These tend to be higher in sodium, added sugar, and fat. Reduce your daily sodium intake. Many people with hypertension should eat less than 1,500 mg of sodium a day. Do not drink alcohol if: Your health care provider tells you not to drink. You are pregnant, may be pregnant, or are planning to become pregnant. If you drink alcohol: Limit how much you have to: 0-1 drink a day for women. 0-2 drinks a day for men. Know how much alcohol is in your drink. In the [place] one drink equals one 12 oz bottle of beer (355 mL), one 5 oz glass of wine (148 mL), or one 1 oz glass of hard liquor (44 mL). Lifestyle  Work with your health care provider to maintain a healthy body weight or to lose weight. Ask what an ideal weight is for you. Get at least 30 minutes of exercise that causes your heart to beat faster (aerobic exercise) most days of the week. Activities may include walking, swimming, or biking. Include exercise to strengthen your muscles (resistance exercise), such as Pilates or lifting weights, as part of your weekly exercise routine. Try to do these types of exercises for 30 minutes at least 3 days a week. Do not use any products that contain nicotine or tobacco. These products include cigarettes, chewing tobacco, and vaping devices, such as e-cigarettes. If you need help quitting, ask your health care provider. Monitor your blood pressure at home as told by your health care provider. Keep all follow-up visits. This is important. Medicines Take over-the-counter and prescription medicines only as told by your health care provider. Follow directions carefully. Blood  pressure medicines must be taken as prescribed. Do not skip doses of blood pressure medicine. Doing this puts you at risk for problems and can make the medicine less effective. Ask your health care provider about side effects or reactions to medicines that you should watch for. Contact a health care provider if you: Think you are having a reaction to a medicine you are taking. Have headaches that keep coming back (recurring). Feel dizzy. Have swelling in your ankles. Have trouble with your vision. Get help right away if you: Develop a severe headache or confusion. Have unusual weakness or numbness. Feel faint. Have severe pain in your chest or abdomen. Vomit repeatedly. Have trouble breathing. These symptoms may be an emergency. Get help right away. Call 911. Do not wait to see if the symptoms will go away. Do not drive yourself to the hospital. Summary Hypertension is when the force of blood pumping through your arteries is too strong. If this condition is not controlled, it may put you at risk for serious complications. Your personal target blood pressure may vary depending on your medical conditions, your age, and other factors. For most people, a normal blood pressure is less than 120/80. Hypertension is treated with lifestyle changes, medicines, or a combination of both. Lifestyle changes include losing weight, eating a healthy,   low-sodium diet, exercising more, and limiting alcohol. This information is not intended to replace advice given to you by your health care provider. Make sure you discuss any questions you have with your health care provider. Document Revised: 06/10/2021 Document Reviewed: 06/10/2021 Elsevier Patient Education  2024 [employer]  

## 2023-02-02 NOTE — Assessment & Plan Note (Signed)
Chronic kidney disease stage IIIb Stable chronic condition Advised to stay well-hydrated and avoid NSAIDs Follows up with nephrologist on a regular basis

## 2023-02-02 NOTE — Assessment & Plan Note (Signed)
Eating better and losing weight Diet and nutrition discussed Advised to decrease amount of daily carbohydrate intake and daily calories and increase amount of plant-based protein in her diet

## 2023-02-02 NOTE — Assessment & Plan Note (Signed)
Well-controlled hypertension. Continue Benicar HCT 40-25 mg daily on amlodipine 5 mg daily Dietary approaches to stop hypertension discussed

## 2023-02-03 DIAGNOSIS — M25562 Pain in left knee: Secondary | ICD-10-CM | POA: Diagnosis not present

## 2023-02-03 DIAGNOSIS — M25561 Pain in right knee: Secondary | ICD-10-CM | POA: Diagnosis not present

## 2023-02-08 DIAGNOSIS — M25561 Pain in right knee: Secondary | ICD-10-CM | POA: Diagnosis not present

## 2023-02-08 DIAGNOSIS — M25562 Pain in left knee: Secondary | ICD-10-CM | POA: Diagnosis not present

## 2023-02-10 DIAGNOSIS — M25562 Pain in left knee: Secondary | ICD-10-CM | POA: Diagnosis not present

## 2023-02-10 DIAGNOSIS — M25561 Pain in right knee: Secondary | ICD-10-CM | POA: Diagnosis not present

## 2023-02-11 ENCOUNTER — Ambulatory Visit
Admission: RE | Admit: 2023-02-11 | Discharge: 2023-02-11 | Disposition: A | Discharge status: Home / Self Care | Payer: Medicare Other | Source: Ambulatory Visit | Attending: Emergency Medicine | Admitting: Emergency Medicine

## 2023-02-11 DIAGNOSIS — Z1231 Encounter for screening mammogram for malignant neoplasm of breast: Secondary | ICD-10-CM | POA: Diagnosis not present

## 2023-02-15 DIAGNOSIS — M25562 Pain in left knee: Secondary | ICD-10-CM | POA: Diagnosis not present

## 2023-02-15 DIAGNOSIS — M25561 Pain in right knee: Secondary | ICD-10-CM | POA: Diagnosis not present

## 2023-02-17 DIAGNOSIS — M25561 Pain in right knee: Secondary | ICD-10-CM | POA: Diagnosis not present

## 2023-02-17 DIAGNOSIS — M25562 Pain in left knee: Secondary | ICD-10-CM | POA: Diagnosis not present

## 2023-03-04 DIAGNOSIS — M25561 Pain in right knee: Secondary | ICD-10-CM | POA: Diagnosis not present

## 2023-03-04 DIAGNOSIS — M25562 Pain in left knee: Secondary | ICD-10-CM | POA: Diagnosis not present

## 2023-03-05 DIAGNOSIS — M25562 Pain in left knee: Secondary | ICD-10-CM | POA: Diagnosis not present

## 2023-03-05 DIAGNOSIS — M25561 Pain in right knee: Secondary | ICD-10-CM | POA: Diagnosis not present

## 2023-03-09 DIAGNOSIS — M25561 Pain in right knee: Secondary | ICD-10-CM | POA: Diagnosis not present

## 2023-03-09 DIAGNOSIS — M25562 Pain in left knee: Secondary | ICD-10-CM | POA: Diagnosis not present

## 2023-03-11 DIAGNOSIS — M25562 Pain in left knee: Secondary | ICD-10-CM | POA: Diagnosis not present

## 2023-03-11 DIAGNOSIS — M25561 Pain in right knee: Secondary | ICD-10-CM | POA: Diagnosis not present

## 2023-03-24 ENCOUNTER — Encounter: Payer: Self-pay | Admitting: Physician Assistant

## 2023-03-24 ENCOUNTER — Ambulatory Visit (INDEPENDENT_AMBULATORY_CARE_PROVIDER_SITE_OTHER): Payer: Medicare Other | Admitting: Physician Assistant

## 2023-03-24 DIAGNOSIS — M1712 Unilateral primary osteoarthritis, left knee: Secondary | ICD-10-CM

## 2023-03-24 DIAGNOSIS — M170 Bilateral primary osteoarthritis of knee: Secondary | ICD-10-CM

## 2023-03-24 DIAGNOSIS — M1711 Unilateral primary osteoarthritis, right knee: Secondary | ICD-10-CM

## 2023-03-24 MED ORDER — LIDOCAINE HCL 1 % IJ SOLN
3.0000 mL | INTRAMUSCULAR | Status: AC | PRN
Start: 2023-03-24 — End: 2023-03-24
  Administered 2023-03-24: 3 mL

## 2023-03-24 MED ORDER — METHYLPREDNISOLONE ACETATE 40 MG/ML IJ SUSP
40.0000 mg | INTRAMUSCULAR | Status: AC | PRN
Start: 2023-03-24 — End: 2023-03-24
  Administered 2023-03-24: 40 mg via INTRA_ARTICULAR

## 2023-03-24 NOTE — Progress Notes (Signed)
   Procedure Note  Patient: Tammy Mccullough             Date of Birth: 12/21/1940           MRN: [id number]             Visit Date: 03/24/2023   HPI: This patient comes in today requesting cortisone injections both knees.  Last injections were 12/14/2022 and states they last until about 2 weeks ago.  She is asking what else can be done for her knees.  She is using Voltaren gel on her knees and feels this is somewhat beneficial.  She has known osteoarthritis of both knees with severe degenerative changes involving the medial compartment of the right knee and moderate changes lateral and patellofemoral compartments right knee.  Left knee also with tricompartmental changes.  She has no scheduled surgery on either knee in the next 6 months.  Physical exam: General well-developed well-nourished female no acute distress. Bilateral knees: Left knee 0 to 90 degrees right knee 0 to 100 degrees.  Significant crepitus with passive range of motion of both knees.  No abnormal warmth erythema or effusion about the knee.  Procedures: Visit Diagnoses:  1. Primary osteoarthritis of right knee   2. Unilateral primary osteoarthritis, left knee     Large Joint Inj: bilateral knee on 03/24/2023 12:14 PM Indications: pain Details: 22 G 1.5 in needle, anterolateral approach  Arthrogram: No  Medications (Right): 3 mL lidocaine 1 %; 40 mg methylPREDNISolone acetate 40 MG/ML Medications (Left): 3 mL lidocaine 1 %; 40 mg methylPREDNISolone acetate 40 MG/ML Outcome: tolerated well, no immediate complications Procedure, treatment alternatives, risks and benefits explained, specific risks discussed. Consent was given by the patient. Immediately prior to procedure a time out was called to verify the correct patient, procedure, equipment, support staff and site/side marked as required. Patient was prepped and draped in the usual sterile fashion.    Plan: Will work on trying to gain approval for viscosupplementation  injections both knees as she feels that the cortisone injections just are not lasting as long as they once were.  She is not interested in surgery at this point in time.  Questions were encouraged and answered.  Handout on viscosupplementation injections was given.  Will call her once the injections are available.

## 2023-03-25 ENCOUNTER — Telehealth: Payer: Self-pay

## 2023-03-25 NOTE — Telephone Encounter (Signed)
VOB submitted for Monovisc, bilateral knee.  

## 2023-03-25 NOTE — Telephone Encounter (Signed)
Bilateral knee injections  

## 2023-03-31 DIAGNOSIS — H04123 Dry eye syndrome of bilateral lacrimal glands: Secondary | ICD-10-CM | POA: Diagnosis not present

## 2023-03-31 DIAGNOSIS — H401332 Pigmentary glaucoma, bilateral, moderate stage: Secondary | ICD-10-CM | POA: Diagnosis not present

## 2023-03-31 DIAGNOSIS — H35363 Drusen (degenerative) of macula, bilateral: Secondary | ICD-10-CM | POA: Diagnosis not present

## 2023-04-05 ENCOUNTER — Encounter: Payer: Self-pay | Admitting: Podiatry

## 2023-04-05 ENCOUNTER — Ambulatory Visit (INDEPENDENT_AMBULATORY_CARE_PROVIDER_SITE_OTHER): Payer: Medicare Other | Admitting: Podiatry

## 2023-04-05 DIAGNOSIS — L600 Ingrowing nail: Secondary | ICD-10-CM | POA: Diagnosis not present

## 2023-04-05 NOTE — Progress Notes (Signed)
  Subjective:  Patient ID: Tammy Mccullough, female    DOB: 1941/06/25,   MRN: [id number]  Chief Complaint  Patient presents with   Nail Problem    82 y.o. female presents for concern of left great toe pain that has been going on for a while but worsened in the past week. Relates it mostly hurts at night when she has sheets on the toenail or if she puts pressure on the area.  Denies any other pedal complaints. Denies n/v/f/c.   Past Medical History:  Diagnosis Date   Arthritis    B knees.   Cataract    Glaucoma    Glucose intolerance (impaired glucose tolerance)    Hypertension    Renal insufficiency     Objective:  Physical Exam: Vascular: DP/PT pulses 2/4 bilateral. CFT <3 seconds. Normal hair growth on digits. Bilateral lower extrremity edema noted bilateral  Skin. No lacerations or abrasions bilateral feet. Incurvation of left great toenail bilateral borders with thickening of nail and subungual exostosis noted. Only pain to medial and lateral borders. No erythema edema or purulence noted.  Musculoskeletal: MMT 5/5 bilateral lower extremities in DF, PF, Inversion and Eversion. Deceased ROM in DF of ankle joint.  Neurological: Sensation intact to light touch.   Assessment:   1. Ingrown left greater toenail      Plan:  Patient was evaluated and treated and all questions answered. Discussed ingrown toenails etiology and treatment options including procedure for removal vs conservative care.  Patient requesting removal of ingrown nail today. Procedure below.  Discussed procedure and post procedure care and patient expressed understanding.  Will follow-up in 2 weeks for nail check or sooner if any problems arise.    Procedure:  Procedure: partial Nail Avulsion of left hallux bilateral nail border.  Surgeon: Louann Sjogren, DPM  Pre-op Dx: Ingrown toenail without infection Post-op: Same  Place of Surgery: Office exam room.  Indications for surgery: Painful and ingrown  toenail.    The patient is requesting removal of nail with chemical matrixectomy. Risks and complications were discussed with the patient for which they understand and written consent was obtained. Under sterile conditions a total of 3 mL of  1% lidocaine plain was infiltrated in a hallux block fashion. Once anesthetized, the skin was prepped in sterile fashion. A tourniquet was then applied. Next the bilateral aspect of hallux nail border was then sharply excised making sure to remove the entire offending nail border.  Next phenol was then applied under standard conditions and copiously irrigated. Silvadene was applied. A dry sterile dressing was applied. After application of the dressing the tourniquet was removed and there is found to be an immediate capillary refill time to the digit. The patient tolerated the procedure well without any complications. Post procedure instructions were discussed the patient for which he verbally understood. Follow-up in two weeks for nail check or sooner if any problems are to arise. Discussed signs/symptoms of infection and directed to call the office immediately should any occur or go directly to the emergency room. In the meantime, encouraged to call the office with any questions, concerns, changes symptoms.   Louann Sjogren, DPM

## 2023-04-05 NOTE — Patient Instructions (Signed)

## 2023-04-21 ENCOUNTER — Ambulatory Visit (INDEPENDENT_AMBULATORY_CARE_PROVIDER_SITE_OTHER): Payer: Medicare Other | Admitting: Podiatry

## 2023-04-21 ENCOUNTER — Encounter: Payer: Self-pay | Admitting: Podiatry

## 2023-04-21 DIAGNOSIS — L600 Ingrowing nail: Secondary | ICD-10-CM

## 2023-04-21 NOTE — Progress Notes (Signed)
  Subjective:  Patient ID: Tammy Mccullough, female    DOB: 15-Feb-1941,   MRN: [id number]  Chief Complaint  Patient presents with   Nail Problem    nail check,left great toe    82 y.o. female presents for follow-up of left ingrown nail procedure. Relates doing well and soaking as instructed . Denies any other pedal complaints. Denies n/v/f/c.   Past Medical History:  Diagnosis Date   Arthritis    B knees.   Cataract    Glaucoma    Glucose intolerance (impaired glucose tolerance)    Hypertension    Renal insufficiency     Objective:  Physical Exam: Vascular: DP/PT pulses 2/4 bilateral. CFT <3 seconds. Normal hair growth on digits. No edema.  Skin. No lacerations or abrasions bilateral feet. Left hallux nail healing well.  Musculoskeletal: MMT 5/5 bilateral lower extremities in DF, PF, Inversion and Eversion. Deceased ROM in DF of ankle joint.  Neurological: Sensation intact to light touch.   Assessment:   1. Ingrown left greater toenail      Plan:  Patient was evaluated and treated and all questions answered. Toe was evaluated and appears to be healing well.  May discontinue soaks and neosporin.  Patient to follow-up as needed.    Louann Sjogren, DPM

## 2023-05-06 ENCOUNTER — Telehealth: Payer: Self-pay | Admitting: Physician Assistant

## 2023-05-06 NOTE — Telephone Encounter (Signed)
Patient called and wanted to know if she was approved for the Gel shot. CB#[phone]

## 2023-05-07 ENCOUNTER — Other Ambulatory Visit: Payer: Self-pay

## 2023-05-07 DIAGNOSIS — M1711 Unilateral primary osteoarthritis, right knee: Secondary | ICD-10-CM

## 2023-05-07 DIAGNOSIS — M1712 Unilateral primary osteoarthritis, left knee: Secondary | ICD-10-CM

## 2023-05-07 NOTE — Telephone Encounter (Signed)
Talked with patient and appointment has been scheduled for gel injection.

## 2023-05-26 ENCOUNTER — Encounter: Payer: Self-pay | Admitting: Orthopaedic Surgery

## 2023-05-26 ENCOUNTER — Ambulatory Visit: Payer: Medicare Other | Admitting: Orthopaedic Surgery

## 2023-05-26 DIAGNOSIS — M170 Bilateral primary osteoarthritis of knee: Secondary | ICD-10-CM | POA: Diagnosis not present

## 2023-05-26 DIAGNOSIS — M1712 Unilateral primary osteoarthritis, left knee: Secondary | ICD-10-CM

## 2023-05-26 DIAGNOSIS — M1711 Unilateral primary osteoarthritis, right knee: Secondary | ICD-10-CM

## 2023-05-26 MED ORDER — HYALURONAN 88 MG/4ML IX SOSY
88.0000 mg | PREFILLED_SYRINGE | INTRA_ARTICULAR | Status: AC | PRN
Start: 2023-05-26 — End: 2023-05-26
  Administered 2023-05-26: 88 mg via INTRA_ARTICULAR

## 2023-05-26 NOTE — Progress Notes (Signed)
Procedure Note  Patient: Tammy Mccullough             Date of Birth: 10/29/1940           MRN: [id number]             Visit Date: 05/26/2023  Procedures: Visit Diagnoses:  1. Unilateral primary osteoarthritis, left knee   2. Unilateral primary osteoarthritis, right knee     Large Joint Inj: R knee on 05/26/2023 9:20 AM Indications: diagnostic evaluation and pain Details: 22 G 1.5 in needle, superolateral approach  Arthrogram: No  Medications: 88 mg Hyaluronan 88 MG/4ML Outcome: tolerated well, no immediate complications Procedure, treatment alternatives, risks and benefits explained, specific risks discussed. Consent was given by the patient. Immediately prior to procedure a time out was called to verify the correct patient, procedure, equipment, support staff and site/side marked as required. Patient was prepped and draped in the usual sterile fashion.    Large Joint Inj: L knee on 05/26/2023 9:20 AM Indications: diagnostic evaluation and pain Details: 22 G 1.5 in needle, superolateral approach  Arthrogram: No  Medications: 88 mg Hyaluronan 88 MG/4ML Outcome: tolerated well, no immediate complications Procedure, treatment alternatives, risks and benefits explained, specific risks discussed. Consent was given by the patient. Immediately prior to procedure a time out was called to verify the correct patient, procedure, equipment, support staff and site/side marked as required. Patient was prepped and draped in the usual sterile fashion.     The patient comes in today for scheduled hyaluronic acid injections with Monovisc to treat the pain from osteoarthritis in her knees.  She has had other conservative treatment measures provided including steroid injections.  She has had no acute change in her medical status.  I explained the rationale behind having injections such as these as well as the risk and benefits of injections.  Neither knee has an effusion today.  Both knees have  varus malalignment and patellofemoral crepitation as well as global tenderness.  I did place Monovisc in both knees today which she tolerated well.  She knows to wait at a minimum 6 months between these type of injections.  In 3 months we can always put steroid injections in her knees if she needs it.  All questions concerns were answered and addressed.  Lot #[id number]

## 2023-06-02 DIAGNOSIS — N183 Chronic kidney disease, stage 3 unspecified: Secondary | ICD-10-CM | POA: Diagnosis not present

## 2023-06-02 DIAGNOSIS — I129 Hypertensive chronic kidney disease with stage 1 through stage 4 chronic kidney disease, or unspecified chronic kidney disease: Secondary | ICD-10-CM | POA: Diagnosis not present

## 2023-06-07 ENCOUNTER — Telehealth: Payer: Self-pay | Admitting: Orthopaedic Surgery

## 2023-06-07 ENCOUNTER — Other Ambulatory Visit: Payer: Self-pay | Admitting: Orthopaedic Surgery

## 2023-06-07 MED ORDER — METHYLPREDNISOLONE 4 MG PO TABS: ORAL_TABLET | ORAL | 0 refills | Status: DC

## 2023-06-07 NOTE — Telephone Encounter (Signed)
Pt called stating she got gel injection Bil knee last week and her knee are worse. Pt made an appt for 10/28 but need medical advice until appt. Please call pt at [phone].

## 2023-06-08 DIAGNOSIS — D518 Other vitamin B12 deficiency anemias: Secondary | ICD-10-CM | POA: Diagnosis not present

## 2023-06-08 DIAGNOSIS — D519 Vitamin B12 deficiency anemia, unspecified: Secondary | ICD-10-CM | POA: Insufficient documentation

## 2023-06-08 DIAGNOSIS — I129 Hypertensive chronic kidney disease with stage 1 through stage 4 chronic kidney disease, or unspecified chronic kidney disease: Secondary | ICD-10-CM | POA: Diagnosis not present

## 2023-06-08 DIAGNOSIS — N183 Chronic kidney disease, stage 3 unspecified: Secondary | ICD-10-CM | POA: Diagnosis not present

## 2023-06-08 DIAGNOSIS — E559 Vitamin D deficiency, unspecified: Secondary | ICD-10-CM | POA: Insufficient documentation

## 2023-06-08 DIAGNOSIS — M898X9 Other specified disorders of bone, unspecified site: Secondary | ICD-10-CM | POA: Diagnosis not present

## 2023-06-12 DIAGNOSIS — Z23 Encounter for immunization: Secondary | ICD-10-CM | POA: Diagnosis not present

## 2023-06-14 ENCOUNTER — Ambulatory Visit: Payer: Medicare Other | Admitting: Physician Assistant

## 2023-06-15 DIAGNOSIS — Z23 Encounter for immunization: Secondary | ICD-10-CM | POA: Diagnosis not present

## 2023-07-12 ENCOUNTER — Other Ambulatory Visit: Payer: Self-pay | Admitting: Emergency Medicine

## 2023-07-12 DIAGNOSIS — I10 Essential (primary) hypertension: Secondary | ICD-10-CM

## 2023-08-03 ENCOUNTER — Telehealth: Payer: Self-pay

## 2023-08-03 NOTE — Telephone Encounter (Signed)
Called and left a VM for patient to CB to schedule for cortisone injection after 08/26/2023 with Dr. Magnus Ivan.

## 2023-08-04 ENCOUNTER — Ambulatory Visit: Payer: Medicare Other | Admitting: Emergency Medicine

## 2023-08-04 ENCOUNTER — Encounter: Payer: Self-pay | Admitting: Emergency Medicine

## 2023-08-04 VITALS — BP 128/70 | Temp 98.5°F | Ht 64.0 in | Wt 210.2 lb

## 2023-08-04 DIAGNOSIS — T39395A Adverse effect of other nonsteroidal anti-inflammatory drugs [NSAID], initial encounter: Secondary | ICD-10-CM | POA: Diagnosis not present

## 2023-08-04 DIAGNOSIS — Z8739 Personal history of other diseases of the musculoskeletal system and connective tissue: Secondary | ICD-10-CM

## 2023-08-04 DIAGNOSIS — I10 Essential (primary) hypertension: Secondary | ICD-10-CM | POA: Diagnosis not present

## 2023-08-04 DIAGNOSIS — N1832 Chronic kidney disease, stage 3b: Secondary | ICD-10-CM

## 2023-08-04 DIAGNOSIS — Z6841 Body Mass Index (BMI) 40.0 and over, adult: Secondary | ICD-10-CM | POA: Diagnosis not present

## 2023-08-04 DIAGNOSIS — N1419 Nephropathy induced by other drugs, medicaments and biological substances: Secondary | ICD-10-CM

## 2023-08-04 NOTE — Assessment & Plan Note (Signed)
Well-controlled hypertension. Continue Benicar HCT 40-25 mg daily on amlodipine 5 mg daily Dietary approaches to stop hypertension discussed

## 2023-08-04 NOTE — Assessment & Plan Note (Signed)
Chronic kidney disease stage IIIb Stable chronic condition Advised to stay well-hydrated and avoid NSAIDs Follows up with nephrologist on a regular basis

## 2023-08-04 NOTE — Progress Notes (Signed)
JAMICIA HSIEH 82 y.o.   Chief Complaint  Patient presents with   Medical Management of Chronic Issues    6 month follow, no new issue     HISTORY OF PRESENT ILLNESS: This is a 82 y.o. female A1A here for 36-month follow-up of multiple chronic medical conditions including hypertension Overall doing well.  Has no complaints or medical concerns today. Wt Readings from Last 3 Encounters:  08/04/23 210 lb 4 oz (95.4 kg)  02/02/23 207 lb 12.8 oz (94.3 kg)  07/29/22 218 lb (98.9 kg)   BP Readings from Last 3 Encounters:  08/04/23 128/70  02/02/23 130/74  10/14/22 (!) 147/76     HPI   Prior to Admission medications   Medication Sig Start Date End Date Taking? Authorizing Provider  acetaminophen (TYLENOL) 500 MG tablet Take 500 mg by mouth every 8 (eight) hours as needed.   Yes [provider]  amLODipine (NORVASC) 5 MG tablet TAKE 1 TABLET(5 MG) BY MOUTH DAILY 07/12/23  Yes Raghav Verrilli, Eilleen Kempf, MD  diclofenac Sodium (VOLTAREN) 1 % GEL Apply 2 g topically 4 (four) times daily. 08/28/20  Yes Shade Flood, MD  methylPREDNISolone (MEDROL) 4 MG tablet Medrol dose pack. Take as instructed 06/07/23  Yes Kathryne Hitch, MD  olmesartan-hydrochlorothiazide [facility] HCT) 40-25 MG tablet TAKE 1 TABLET BY MOUTH DAILY 08/13/22  Yes Titus Drone, Eilleen Kempf, MD  Polyethyl Glycol-Propyl Glycol (SYSTANE HYDRATION PF OP) Apply to eye. 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   Cataract    Glaucoma    Glucose intolerance (impaired glucose tolerance)    Hypertension    Renal insufficiency     Past Surgical History:  Procedure Laterality Date   CATARACT EXTRACTION Right 07-2014   CATARACT EXTRACTION Left 01/16/2016   COLONOSCOPY     10 +yrs ago in [place], normal per pt   VAGINAL DELIVERY     x2    Social History   Socioeconomic History   Marital status: Widowed    Spouse name: Not on file   Number of children: 2   Years of education: Not on file   Highest education level: Not on file  Occupational History   Occupation: retired  Tobacco Use   Smoking status: Never   Smokeless tobacco: Never  Substance and Sexual Activity   Alcohol use: Yes    Alcohol/week: 6.0 standard drinks of alcohol    Types: 6 Standard drinks or equivalent per week    Comment: 2-3   Drug use: No   Sexual activity: Not Currently  Other Topics Concern   Not on file  Social History Narrative   Marital status: widowed since 2010 after 15 years of marriage.  Not dating but would like to be.      Children:  2  Children; no grandchildren; 2 adopted grandchildren.  One son is disabled; patient supports; son is obese.      Living: alone with 1 cat.      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      Tobacco: never      Alcohol: weekends liquor; some during the week.  3-5 cups per week.      Drugs: none      Exercise: water aerobics three days per week.  [employer].      Seatbelt: 100%;      Guns: none      ADLs:: independent with ADLs.   Drives. No assistant devices.      Advanced Directives: yes; healthcare POA:  Older brother Lorella Nimrod [place]);  FULL CODE but no prolonged measures.     Social Drivers of Corporate investment banker Strain: Low Risk   (05/06/2022)   Overall Financial Resource Strain (CARDIA)    Difficulty of Paying Living Expenses: Not hard at all  Food Insecurity: No Food Insecurity (05/06/2022)   Hunger Vital Sign    Worried About Running Out of Food in the Last Year: Never true    Ran Out of Food in the Last Year: Never true  Transportation Needs: No Transportation Needs (05/06/2022)   PRAPARE - Administrator, Civil Service (Medical): No    Lack of Transportation (Non-Medical): No  Physical Activity: Inactive (05/06/2022)   Exercise Vital Sign    Days of Exercise per Week: 0 days    Minutes of Exercise per Session: 0 min  Stress: No Stress Concern Present (05/06/2022)   [employer] of Occupational Health - Occupational Stress Questionnaire    Feeling of Stress : Not at all  Social Connections: Moderately Integrated (05/06/2022)   Social Connection and Isolation Panel [NHANES]    Frequency of Communication with Friends and Family: More than three times a week    Frequency of Social Gatherings with Friends and Family: More than three times a week    Attends Religious Services: More than 4 times per year    Active Member of [employer] or Organizations: Yes    Attends Banker Meetings: More than 4 times per year    Marital Status: Widowed  Intimate Partner Violence: Not At Risk (05/06/2022)   Humiliation, Afraid, Rape, and Kick questionnaire    Fear of Current or Ex-Partner: No    Emotionally Abused: No    Physically Abused: No    Sexually Abused: No    Family History  Problem Relation Age of Onset   Diabetes Mother    Heart disease Mother        unsure   COPD Father    Diabetes Sister    Hypertension Sister    Kidney disease Sister    Diabetes Brother    Diabetes Brother    Colon cancer Neg Hx    Rectal cancer Neg Hx    Stomach cancer Neg Hx      Review of Systems  Constitutional: Negative.  Negative for chills and fever.  HENT: Negative.  Negative for congestion and sore  throat.   Respiratory: Negative.  Negative for cough and shortness of breath.   Cardiovascular: Negative.  Negative for chest pain and palpitations.  Gastrointestinal:  Negative for abdominal pain, constipation, diarrhea, nausea and vomiting.  Genitourinary: Negative.  Negative for dysuria and hematuria.  Skin: Negative.  Negative for rash.  Neurological: Negative.  Negative for dizziness and headaches.  All other systems reviewed and are negative.   Vitals:   08/04/23 0828  BP: 128/70  Temp: 98.5 F (36.9 C)    Physical Exam Vitals reviewed.  Constitutional:      Appearance: Normal appearance.  HENT:     Head: Normocephalic.     Mouth/Throat:     Mouth: Mucous membranes are moist.     Pharynx: Oropharynx is clear.  Eyes:     Extraocular Movements: Extraocular movements intact.     Pupils: Pupils are equal, round, and reactive to light.  Cardiovascular:     Rate and Rhythm: Normal rate and regular rhythm.     Pulses: Normal pulses.     Heart sounds: Normal heart sounds.  Pulmonary:     Effort: Pulmonary effort is normal.     Breath sounds: Normal breath sounds.  Musculoskeletal:     Cervical back: No tenderness.  Lymphadenopathy:     Cervical: No cervical adenopathy.  Skin:    General: Skin is warm and dry.     Capillary Refill: Capillary refill takes less than 2 seconds.  Neurological:     General: No focal deficit present.     Mental Status: She is alert and oriented to person, place, and time.  Psychiatric:        Mood and Affect: Mood normal.        Behavior: Behavior normal.      ASSESSMENT & PLAN: A total of 44 minutes was spent with the patient and counseling/coordination of care regarding preparing for this visit, review of most recent office visit notes, review of multiple chronic medical conditions and their management, review of all medications, review of most recent bloodwork results, review of health maintenance items, education on nutrition,  prognosis, documentation, and need for follow up.   Problem List Items Addressed This Visit       Cardiovascular and Mediastinum   HTN (hypertension) - Primary   Well-controlled hypertension. Continue Benicar HCT 40-25 mg daily on amlodipine 5 mg daily Dietary approaches to stop hypertension discussed        Genitourinary   Chronic renal insufficiency   Was able to follow-up with nephrologist.  No specific treatment. 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      History of osteoarthritis   Stable.  Takes Tylenol as needed.  Advised to avoid NSAIDs.      Patient Instructions  Health Maintenance After Age 39 After age 50, you are at a higher risk for certain long-term diseases and infections as well as injuries from falls. Falls are a major cause of broken bones and head injuries in people who are older than age 41. Getting regular preventive care can help to keep you healthy and well. Preventive care includes getting regular testing and making lifestyle changes as recommended by your health care provider. Talk with your health care provider about: Which screenings and tests you should have. A screening is a test that checks for a disease when you have no symptoms. A diet and exercise plan that is right for  you. What should I know about screenings and tests to prevent falls? Screening and testing are the best ways to find a health problem early. Early diagnosis and treatment give you the best chance of managing medical conditions that are common after age 83. Certain conditions and lifestyle choices may  make you more likely to have a fall. Your health care provider may recommend: Regular vision checks. Poor vision and conditions such as cataracts can make you more likely to have a fall. If you wear glasses, make sure to get your prescription updated if your vision changes. Medicine review. Work with your health care provider to regularly review all of the medicines you are taking, including over-the-counter medicines. Ask your health care provider about any side effects that may make you more likely to have a fall. Tell your health care provider if any medicines that you take make you feel dizzy or sleepy. Strength and balance checks. Your health care provider may recommend certain tests to check your strength and balance while standing, walking, or changing positions. Foot health exam. Foot pain and numbness, as well as not wearing proper footwear, can make you more likely to have a fall. Screenings, including: Osteoporosis screening. Osteoporosis is a condition that causes the bones to get weaker and break more easily. Blood pressure screening. Blood pressure changes and medicines to control blood pressure can make you feel dizzy. Depression screening. You may be more likely to have a fall if you have a fear of falling, feel depressed, or feel unable to do activities that you used to do. Alcohol use screening. Using too much alcohol can affect your balance and may make you more likely to have a fall. Follow these instructions at home: Lifestyle Do not drink alcohol if: Your health care provider tells you not to drink. If you drink alcohol: Limit how much you have to: 0-1 drink a day for women. 0-2 drinks a day for men. Know how much alcohol is in your drink. In the [place] one drink equals one 12 oz bottle of beer (355 mL), one 5 oz glass of wine (148 mL), or one 1 oz glass of hard liquor (44 mL). Do not use any products that contain nicotine or tobacco. These products include cigarettes,  chewing tobacco, and vaping devices, such as e-cigarettes. If you need help quitting, ask your health care provider. Activity  Follow a regular exercise program to stay fit. This will help you maintain your balance. Ask your health care provider what types of exercise are appropriate for you. If you need a cane or walker, use it as recommended by your health care provider. Wear supportive shoes that have nonskid soles. Safety  Remove any tripping hazards, such as rugs, cords, and clutter. Install safety equipment such as grab bars in bathrooms and safety rails on stairs. Keep rooms and walkways well-lit. General instructions Talk with your health care provider about your risks for falling. Tell your health care provider if: You fall. Be sure to tell your health care provider about all falls, even ones that seem minor. You feel dizzy, tiredness (fatigue), or off-balance. Take over-the-counter and prescription medicines only as told by your health care provider. These include supplements. Eat a healthy diet and maintain a healthy weight. A healthy diet includes low-fat dairy products, low-fat (lean) meats, and fiber from whole grains, beans, and lots of fruits and vegetables. Stay current with your vaccines. Schedule regular health, dental, and eye exams. Summary Having a healthy lifestyle and getting  preventive care can help to protect your health and wellness after age 39. Screening and testing are the best way to find a health problem early and help you avoid having a fall. Early diagnosis and treatment give you the best chance for managing medical conditions that are more common for people who are older than age 47. Falls are a major cause of broken bones and head injuries in people who are older than age 3. Take precautions to prevent a fall at home. Work with your health care provider to learn what changes you can make to improve your health and wellness and to prevent falls. This  information is not intended to replace advice given to you by your health care provider. Make sure you discuss any questions you have with your health care provider. Document Revised: 12/23/2020 Document Reviewed: 12/23/2020 Elsevier Patient Education  2024 Elsevier Inc.     Edwina Barth, MD [facility]

## 2023-08-04 NOTE — Patient Instructions (Signed)
Health Maintenance After Age 84 After age 84, you are at a higher risk for certain long-term diseases and infections as well as injuries from falls. Falls are a major cause of broken bones and head injuries in people who are older than age 84. Getting regular preventive care can help to keep you healthy and well. Preventive care includes getting regular testing and making lifestyle changes as recommended by your health care provider. Talk with your health care provider about: Which screenings and tests you should have. A screening is a test that checks for a disease when you have no symptoms. A diet and exercise plan that is right for you. What should I know about screenings and tests to prevent falls? Screening and testing are the best ways to find a health problem early. Early diagnosis and treatment give you the best chance of managing medical conditions that are common after age 84. Certain conditions and lifestyle choices may make you more likely to have a fall. Your health care provider may recommend: Regular vision checks. Poor vision and conditions such as cataracts can make you more likely to have a fall. If you wear glasses, make sure to get your prescription updated if your vision changes. Medicine review. Work with your health care provider to regularly review all of the medicines you are taking, including over-the-counter medicines. Ask your health care provider about any side effects that may make you more likely to have a fall. Tell your health care provider if any medicines that you take make you feel dizzy or sleepy. Strength and balance checks. Your health care provider may recommend certain tests to check your strength and balance while standing, walking, or changing positions. Foot health exam. Foot pain and numbness, as well as not wearing proper footwear, can make you more likely to have a fall. Screenings, including: Osteoporosis screening. Osteoporosis is a condition that causes  the bones to get weaker and break more easily. Blood pressure screening. Blood pressure changes and medicines to control blood pressure can make you feel dizzy. Depression screening. You may be more likely to have a fall if you have a fear of falling, feel depressed, or feel unable to do activities that you used to do. Alcohol use screening. Using too much alcohol can affect your balance and may make you more likely to have a fall. Follow these instructions at home: Lifestyle Do not drink alcohol if: Your health care provider tells you not to drink. If you drink alcohol: Limit how much you have to: 0-1 drink a day for women. 0-2 drinks a day for men. Know how much alcohol is in your drink. In the [place] one drink equals one 12 oz bottle of beer (355 mL), one 5 oz glass of wine (148 mL), or one 1 oz glass of hard liquor (44 mL). Do not use any products that contain nicotine or tobacco. These products include cigarettes, chewing tobacco, and vaping devices, such as e-cigarettes. If you need help quitting, ask your health care provider. Activity  Follow a regular exercise program to stay fit. This will help you maintain your balance. Ask your health care provider what types of exercise are appropriate for you. If you need a cane or walker, use it as recommended by your health care provider. Wear supportive shoes that have nonskid soles. Safety  Remove any tripping hazards, such as rugs, cords, and clutter. Install safety equipment such as grab bars in bathrooms and safety rails on stairs. Keep rooms and walkways   well-lit. General instructions Talk with your health care provider about your risks for falling. Tell your health care provider if: You fall. Be sure to tell your health care provider about all falls, even ones that seem minor. You feel dizzy, tiredness (fatigue), or off-balance. Take over-the-counter and prescription medicines only as told by your health care provider. These include  supplements. Eat a healthy diet and maintain a healthy weight. A healthy diet includes low-fat dairy products, low-fat (lean) meats, and fiber from whole grains, beans, and lots of fruits and vegetables. Stay current with your vaccines. Schedule regular health, dental, and eye exams. Summary Having a healthy lifestyle and getting preventive care can help to protect your health and wellness after age 84. Screening and testing are the best way to find a health problem early and help you avoid having a fall. Early diagnosis and treatment give you the best chance for managing medical conditions that are more common for people who are older than age 84. Falls are a major cause of broken bones and head injuries in people who are older than age 84. Take precautions to prevent a fall at home. Work with your health care provider to learn what changes you can make to improve your health and wellness and to prevent falls. This information is not intended to replace advice given to you by your health care provider. Make sure you discuss any questions you have with your health care provider. Document Revised: 12/23/2020 Document Reviewed: 12/23/2020 Elsevier Patient Education  2024 Elsevier Inc.  

## 2023-08-04 NOTE — Assessment & Plan Note (Signed)
Was able to follow-up with nephrologist.  No specific treatment. Advised to stay well-hydrated and avoid NSAIDs Has appointment for follow-up next October 2025

## 2023-08-04 NOTE — Assessment & Plan Note (Signed)
Stable.  Takes Tylenol as needed.  Advised to avoid NSAIDs.

## 2023-08-04 NOTE — Assessment & Plan Note (Signed)
Diet and nutrition discussed. 

## 2023-08-05 ENCOUNTER — Other Ambulatory Visit: Payer: Self-pay | Admitting: Emergency Medicine

## 2023-08-05 DIAGNOSIS — I10 Essential (primary) hypertension: Secondary | ICD-10-CM

## 2023-09-01 ENCOUNTER — Ambulatory Visit (INDEPENDENT_AMBULATORY_CARE_PROVIDER_SITE_OTHER): Payer: Medicare Other | Admitting: Orthopaedic Surgery

## 2023-09-01 ENCOUNTER — Encounter: Payer: Self-pay | Admitting: Orthopaedic Surgery

## 2023-09-01 DIAGNOSIS — M25561 Pain in right knee: Secondary | ICD-10-CM | POA: Diagnosis not present

## 2023-09-01 DIAGNOSIS — G8929 Other chronic pain: Secondary | ICD-10-CM

## 2023-09-01 DIAGNOSIS — M170 Bilateral primary osteoarthritis of knee: Secondary | ICD-10-CM

## 2023-09-01 DIAGNOSIS — M1712 Unilateral primary osteoarthritis, left knee: Secondary | ICD-10-CM | POA: Insufficient documentation

## 2023-09-01 DIAGNOSIS — M1711 Unilateral primary osteoarthritis, right knee: Secondary | ICD-10-CM | POA: Insufficient documentation

## 2023-09-01 DIAGNOSIS — M25562 Pain in left knee: Secondary | ICD-10-CM | POA: Diagnosis not present

## 2023-09-01 MED ORDER — METHYLPREDNISOLONE ACETATE 40 MG/ML IJ SUSP
40.0000 mg | INTRAMUSCULAR | Status: AC | PRN
  Administered 2023-09-01: 40 mg via INTRA_ARTICULAR

## 2023-09-01 MED ORDER — LIDOCAINE HCL 1 % IJ SOLN
3.0000 mL | INTRAMUSCULAR | Status: AC | PRN
  Administered 2023-09-01: 3 mL

## 2023-09-01 NOTE — Progress Notes (Signed)
 The patient is well-known to us .  She comes in today requesting steroid injections in both her knees.  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  She last had steroid injections of both knees about 5 months ago.  3 months ago she did have hyaluronic acid in both knees.  She likes this regimen of injections because that is helping her.  She has had no acute change in her medical status and is not diabetic.  She is not interested in any type of surgical intervention.  Both knees were assessed.  She does have a large soft tissue envelope around both knees but no effusion.  Both knees have varus malalignment but good range of motion.  Both knees have global tenderness.  I did place a steroid injection in both knees per her request.  Will see her back in 3 months to consider repeat steroid injections and then consider ordering hyaluronic acid again for her knees.    Procedure Note  Patient: Tammy Mccullough             Date of Birth: 10/22/1940           MRN: [id number]             Visit Date: 09/01/2023  Procedures: Visit Diagnoses:  1. Chronic pain of left knee   2. Chronic pain of right knee   3. Unilateral primary osteoarthritis, left knee   4. Unilateral primary osteoarthritis, right knee     Large Joint Inj: R knee on 09/01/2023 1:12 PM Indications: diagnostic evaluation and pain Details: 22 G 1.5 in needle, superolateral approach  Arthrogram: No  Medications: 3 mL lidocaine  1 %; 40 mg methylPREDNISolone  acetate 40 MG/ML Outcome: tolerated well, no immediate complications Procedure, treatment alternatives, risks and benefits explained, specific risks discussed. Consent was given by the patient. Immediately prior to procedure a time out was called to verify the correct patient, procedure, equipment, support staff and site/side marked as required. Patient was prepped and draped in the usual sterile fashion.    Large Joint Inj: L knee on 09/01/2023 1:12  PM Indications: diagnostic evaluation and pain Details: 22 G 1.5 in needle, superolateral approach  Arthrogram: No  Medications: 3 mL lidocaine  1 %; 40 mg methylPREDNISolone  acetate 40 MG/ML Outcome: tolerated well, no immediate complications Procedure, treatment alternatives, risks and benefits explained, specific risks discussed. Consent was given by the patient. Immediately prior to procedure a time out was called to verify the correct patient, procedure, equipment, support staff and site/side marked as required. Patient was prepped and draped in the usual sterile fashion.

## 2023-09-02 DIAGNOSIS — H401332 Pigmentary glaucoma, bilateral, moderate stage: Secondary | ICD-10-CM | POA: Diagnosis not present

## 2023-09-02 DIAGNOSIS — H04123 Dry eye syndrome of bilateral lacrimal glands: Secondary | ICD-10-CM | POA: Diagnosis not present

## 2023-09-02 DIAGNOSIS — H35363 Drusen (degenerative) of macula, bilateral: Secondary | ICD-10-CM | POA: Diagnosis not present

## 2023-09-10 ENCOUNTER — Ambulatory Visit (INDEPENDENT_AMBULATORY_CARE_PROVIDER_SITE_OTHER): Payer: Medicare Other

## 2023-09-10 DIAGNOSIS — Z0000 Encounter for general adult medical examination without abnormal findings: Secondary | ICD-10-CM

## 2023-09-10 NOTE — Patient Instructions (Signed)
Tammy Mccullough , Thank you for taking time to come for your Medicare Wellness Visit. I appreciate your ongoing commitment to your health goals. Please review the following plan we discussed and let me know if I can assist you in the future.   Referrals/Orders/Follow-Ups/Clinician Recommendations: none  This is a list of the screening recommended for you and due dates:  Health Maintenance  Topic Date Due   Flu Shot  03/18/2023   COVID-19 Vaccine (6 - 2024-25 season) 04/18/2023   Medicare Annual Wellness Visit  09/09/2024   DTaP/Tdap/Td vaccine (3 - Td or Tdap) 06/02/2026   Pneumonia Vaccine  Completed   DEXA scan (bone density measurement)  Completed   Zoster (Shingles) Vaccine  Completed   HPV Vaccine  Aged Out   Colon Cancer Screening  Discontinued    Advanced directives: (Declined) Advance directive discussed with you today. Even though you declined this today, please call our office should you change your mind, and we can give you the proper paperwork for you to fill out.  Next Medicare Annual Wellness Visit scheduled for next year: Yes  insert Preventive Care attachment Insert FALL PREVENTION attachment if needed

## 2023-09-10 NOTE — Progress Notes (Signed)
Subjective:   Tammy Mccullough is a 83 y.o. female who presents for Medicare Annual (Subsequent) preventive examination.  Visit Complete: Virtual I connected with  Franco Collet on 09/10/23 by a audio enabled telemedicine application and verified that I am speaking with the correct person using two identifiers.  This patient declined Interactive audio and Acupuncturist. Therefore the visit was completed with audio only.  Patient Location: Home  Provider Location: Home Office  I discussed the limitations of evaluation and management by telemedicine. The patient expressed understanding and agreed to proceed.  Vital Signs: Because this visit was a virtual/telehealth visit, some criteria may be missing or patient reported. Any vitals not documented were not able to be obtained and vitals that have been documented are patient reported.    Cardiac Risk Factors include: advanced age (>15men, >56 women);hypertension     Objective:    Today's Vitals   There is no height or weight on file to calculate BMI.     09/10/2023    8:16 AM 05/06/2022    1:10 PM 10/25/2017    9:21 AM 10/20/2016    8:08 AM 09/09/2015    8:27 AM 09/26/2014    3:59 PM  Advanced Directives  Does Patient Have a Medical Advance Directive? No No Yes No No Yes  Type of Advance Directive   Living will   Healthcare Power of Attorney  Does patient want to make changes to medical advance directive?   No - Patient declined     Would patient like information on creating a medical advance directive?  No - Patient declined  No - Patient declined Yes - Educational materials given     Current Medications (verified) Outpatient Encounter Medications as of 09/10/2023  Medication Sig   acetaminophen (TYLENOL) 500 MG tablet Take 500 mg by mouth every 8 (eight) hours as needed.   amLODipine (NORVASC) 5 MG tablet TAKE 1 TABLET(5 MG) BY MOUTH DAILY   diclofenac Sodium (VOLTAREN) 1 % GEL Apply 2 g topically 4 (four)  times daily.   IYUZEH 0.005 % SOLN SMARTSIG:1 Drop(s) In Eye(s) Every Evening   olmesartan-hydrochlorothiazide (BENICAR HCT) 40-25 MG tablet TAKE 1 TABLET BY MOUTH DAILY   Povidone (IVIZIA DRY EYES OP) Apply 1 drop to eye as needed.   methylPREDNISolone (MEDROL) 4 MG tablet Medrol dose pack. Take as instructed (Patient not taking: Reported on 09/10/2023)   Polyethyl Glycol-Propyl Glycol (SYSTANE HYDRATION PF OP) Apply to eye. Dry eyes (Patient not taking: Reported on 09/10/2023)   ZIOPTAN 0.0015 % SOLN  (Patient not taking: Reported on 09/10/2023)   No facility-administered encounter medications on file as of 09/10/2023.    Allergies (verified) Patient has no known allergies.   History: Past Medical History:  Diagnosis Date   Arthritis    B knees.   Cataract    Glaucoma    Glucose intolerance (impaired glucose tolerance)    Hypertension    Renal insufficiency    Past Surgical History:  Procedure Laterality Date   CATARACT EXTRACTION Right 07-2014   CATARACT EXTRACTION Left 01/16/2016   COLONOSCOPY     10 +yrs ago in [place], normal per pt   VAGINAL DELIVERY     x2   Family History  Problem Relation Age of Onset   Diabetes Mother    Heart disease Mother        unsure   COPD Father    Diabetes Sister    Hypertension Sister    Kidney disease Sister  Diabetes Brother    Diabetes Brother    Colon cancer Neg Hx    Rectal cancer Neg Hx    Stomach cancer Neg Hx    Social History   Socioeconomic History   Marital status: Widowed    Spouse name: Not on file   Number of children: 2   Years of education: Not on file   Highest education level: Not on file  Occupational History   Occupation: retired  Tobacco Use   Smoking status: Never   Smokeless tobacco: Never  Vaping Use   Vaping status: Never Used  Substance and Sexual Activity   Alcohol use: Not Currently    Alcohol/week: 6.0 standard drinks of alcohol    Types: 6 Standard drinks or equivalent per week    Comment:  2-3   Drug use: No   Sexual activity: Not Currently  Other Topics Concern   Not on file  Social History Narrative   Marital status: widowed since 2010 after 15 years of marriage.  Not dating but would like to be.      Children:  2 Children; no grandchildren; 2 adopted grandchildren.  One son is disabled; patient supports; son is obese.      Living: alone with 1 cat.      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      Tobacco: never      Alcohol: weekends liquor; some during the week.  3-5 cups per week.      Drugs: none      Exercise: water aerobics three days per week.  [employer].      Seatbelt: 100%;      Guns: none      ADLs:: independent with ADLs.   Drives. No assistant devices.      Advanced Directives: yes; healthcare POA:  Older brother Lorella Nimrod [place]);  FULL CODE but no prolonged measures.     Social Drivers of Corporate investment banker Strain: Low Risk  (09/10/2023)   Overall Financial Resource Strain (CARDIA)    Difficulty of Paying Living Expenses: Not hard at all  Food Insecurity: No Food Insecurity (09/10/2023)   Hunger Vital Sign    Worried About Running Out of Food in the Last Year: Never true    Ran Out of Food in the Last Year: Never true  Transportation Needs: No Transportation Needs (09/10/2023)   PRAPARE - Administrator, Civil Service (Medical): No    Lack of Transportation (Non-Medical): No  Physical Activity: Insufficiently Active (09/10/2023)   Exercise Vital Sign    Days of Exercise per Week: 7 days    Minutes of Exercise per Session: 10 min  Stress: No Stress Concern Present (09/10/2023)   [employer] of Occupational Health - Occupational Stress Questionnaire    Feeling of Stress : Not at all  Social Connections: Socially Isolated (09/10/2023)   Social Connection and Isolation Panel [NHANES]    Frequency of Communication with Friends and Family: More than three times a week     Frequency of Social Gatherings with Friends and Family: More than three times a week    Attends Religious Services: Never    Database administrator or Organizations: No    Attends Banker Meetings: Never    Marital Status: Widowed    Tobacco Counseling Counseling given: Not Answered   Clinical Intake:  Pre-visit preparation completed: Yes  Pain : No/denies pain  Nutritional Risks: None Diabetes: No  How often do you need to have someone help you when you read instructions, pamphlets, or other written materials from your doctor or pharmacy?: 1 - Never  Interpreter Needed?: No  Information entered by :: NAllen LPN   Activities of Daily Living    09/10/2023    8:07 AM  In your present state of health, do you have any difficulty performing the following activities:  Hearing? 0  Vision? 0  Difficulty concentrating or making decisions? 0  Walking or climbing stairs? 0  Dressing or bathing? 0  Doing errands, shopping? 0  Preparing Food and eating ? N  Using the Toilet? N  In the past six months, have you accidently leaked urine? N  Do you have problems with loss of bowel control? N  Managing your Medications? N  Managing your Finances? N  Housekeeping or managing your Housekeeping? N    Patient Care Team: Georgina Quint, MD as PCP - General (Internal Medicine) Chalmers Guest, MD as Consulting Physician (Ophthalmology)  Indicate any recent Medical Services you may have received from other than [facility] providers in the past year (date may be approximate).     Assessment:   This is a routine wellness examination for [place].  Hearing/Vision screen Hearing Screening - Comments:: Denies hearing issues Vision Screening - Comments:: Regular eye exams, Dr. Harlon Flor   Goals Addressed             This Visit's Progress    Patient Stated       09/10/2023, wants to lose weight and drink more water       Depression Screen    09/10/2023     8:18 AM 08/04/2023    8:30 AM 02/02/2023    8:30 AM 07/29/2022    8:16 AM 05/06/2022    1:15 PM 01/27/2022    9:43 AM 07/21/2021    9:17 AM  PHQ 2/9 Scores  PHQ - 2 Score 0 0 0 0 0 0 0    Fall Risk    09/10/2023    8:17 AM 08/04/2023    8:30 AM 02/02/2023    8:30 AM 07/29/2022    8:16 AM 05/06/2022    1:12 PM  Fall Risk   Falls in the past year? 0 0 0 0 0  Number falls in past yr: 0 0 0 0 0  Injury with Fall? 0 0 0 0 0  Risk for fall due to : Medication side effect No Fall Risks No Fall Risks No Fall Risks No Fall Risks  Follow up Falls prevention discussed;Falls evaluation completed Falls evaluation completed Falls evaluation completed Falls evaluation completed Falls prevention discussed    MEDICARE RISK AT HOME: Medicare Risk at Home Any stairs in or around the home?: Yes If so, are there any without handrails?: No Home free of loose throw rugs in walkways, pet beds, electrical cords, etc?: Yes Adequate lighting in your home to reduce risk of falls?: Yes Life alert?: No Use of a cane, walker or w/c?: No Grab bars in the bathroom?: No Shower chair or bench in shower?: Yes Elevated toilet seat or a handicapped toilet?: Yes  TIMED UP AND GO:  Was the test performed?  No    Cognitive Function:        09/10/2023    8:18 AM 05/06/2022    1:22 PM 05/04/2021   12:01 PM 10/25/2017    9:18 AM  6CIT Screen  What Year? 0 points 0  points 0 points 0 points  What month? 0 points 0 points  0 points  What time? 0 points 0 points 0 points 0 points  Count back from 20 0 points 0 points 0 points 0 points  Months in reverse 0 points 0 points 0 points 0 points  Repeat phrase 2 points 0 points 0 points 0 points  Total Score 2 points 0 points  0 points    Immunizations Immunization History  Administered Date(s) Administered   Fluad Quad(high Dose 65+) 06/02/2019, 04/26/2021   Influenza Split 05/03/2013, 05/11/2015   Influenza, High Dose Seasonal PF 05/18/2018   Influenza,inj,Quad  PF,6+ Mos 07/25/2014, 04/21/2016, 04/27/2017   Influenza-Unspecified 05/22/2020, 05/11/2022   PFIZER(Purple Top)SARS-COV-2 Vaccination 09/23/2019, 10/14/2019, 05/22/2020, 04/26/2021   Pneumococcal Conjugate-13 05/03/2013, 07/25/2014   Pneumococcal Polysaccharide-23 12/19/2019   Pneumococcal-Unspecified 05/03/2013   Td 08/17/2005   Tdap 06/02/2016   Unspecified SARS-COV-2 Vaccination 05/11/2022   Zoster Recombinant(Shingrix) 01/15/2017, 06/17/2017, 07/21/2017   Zoster, Live 10/22/2015    TDAP status: Up to date  Flu Vaccine status: Up to date  Pneumococcal vaccine status: Up to date  Covid-19 vaccine status: Completed vaccines  Qualifies for Shingles Vaccine? Yes   Zostavax completed Yes   Shingrix Completed?: Yes  Screening Tests Health Maintenance  Topic Date Due   INFLUENZA VACCINE  03/18/2023   COVID-19 Vaccine (6 - 2024-25 season) 04/18/2023   Medicare Annual Wellness (AWV)  09/09/2024   DTaP/Tdap/Td (3 - Td or Tdap) 06/02/2026   Pneumonia Vaccine 41+ Years old  Completed   DEXA SCAN  Completed   Zoster Vaccines- Shingrix  Completed   HPV VACCINES  Aged Out   Colonoscopy  Discontinued    Health Maintenance  Health Maintenance Due  Topic Date Due   INFLUENZA VACCINE  03/18/2023   COVID-19 Vaccine (6 - 2024-25 season) 04/18/2023    Colorectal cancer screening: No longer required.   Mammogram status: Completed 02/11/2023. Repeat every year  Bone Density status: Completed 08/20/2014.   Lung Cancer Screening: (Low Dose CT Chest recommended if Age 99-80 years, 20 pack-year currently smoking OR have quit w/in 15years.) does not qualify.   Lung Cancer Screening Referral: no  Additional Screening:  Hepatitis C Screening: does not qualify;   Vision Screening: Recommended annual ophthalmology exams for early detection of glaucoma and other disorders of the eye. Is the patient up to date with their annual eye exam?  Yes  Who is the provider or what is the name of  the office in which the patient attends annual eye exams? Dr. Harlon Flor If pt is not established with a provider, would they like to be referred to a provider to establish care? No .   Dental Screening: Recommended annual dental exams for proper oral hygiene  Diabetic Foot Exam: n/a  Community Resource Referral / Chronic Care Management: CRR required this visit?  No   CCM required this visit?  No     Plan:     I have personally reviewed and noted the following in the patient's chart:   Medical and social history Use of alcohol, tobacco or illicit drugs  Current medications and supplements including opioid prescriptions. Patient is not currently taking opioid prescriptions. Functional ability and status Nutritional status Physical activity Advanced directives List of other physicians Hospitalizations, surgeries, and ER visits in previous 12 months Vitals Screenings to include cognitive, depression, and falls Referrals and appointments  In addition, I have reviewed and discussed with patient certain preventive protocols, quality metrics, and best practice  recommendations. A written personalized care plan for preventive services as well as general preventive health recommendations were provided to patient.     Barb Merino, LPN   11/23/8117   After Visit Summary: (MyChart) Due to this being a telephonic visit, the after visit summary with patients personalized plan was offered to patient via MyChart   Nurse Notes: none

## 2023-12-01 ENCOUNTER — Ambulatory Visit: Admitting: Orthopaedic Surgery

## 2023-12-01 DIAGNOSIS — G8929 Other chronic pain: Secondary | ICD-10-CM

## 2023-12-01 DIAGNOSIS — M25562 Pain in left knee: Secondary | ICD-10-CM | POA: Diagnosis not present

## 2023-12-01 DIAGNOSIS — M25561 Pain in right knee: Secondary | ICD-10-CM | POA: Diagnosis not present

## 2023-12-01 MED ORDER — METHYLPREDNISOLONE ACETATE 40 MG/ML IJ SUSP
40.0000 mg | INTRAMUSCULAR | Status: AC | PRN
  Administered 2023-12-01: 40 mg via INTRA_ARTICULAR

## 2023-12-01 MED ORDER — LIDOCAINE HCL 1 % IJ SOLN
3.0000 mL | INTRAMUSCULAR | Status: AC | PRN
  Administered 2023-12-01: 3 mL

## 2023-12-01 NOTE — Progress Notes (Signed)
 The patient is here today requesting steroid injections in both her knees.  She is 83 years old and has known significant arthritis in both knees.  It has been exactly 3 months since her last knee injections.  She is not a diabetic.  She has had no acute change in her medical status.  She reports a deep joint pain.  She says injections do work for a while.  She is not interested in any type of surgical intervention.  Examination of both knees show no significant effusion but pain throughout the arc of motion.  There is global tenderness in all 3 compartments.  Per her request I did place a steroid injection of both knees that she tolerated well.  She has had hyaluronic acid in the past and that has not really helped her as much and would like her arthritis is significant enough that that is not warranted at this standpoint.  She knows to wait again at least 3 months between steroid injections.    Procedure Note  Patient: Tammy Mccullough             Date of Birth: Sep 20, 1940           MRN: [id number]             Visit Date: 12/01/2023  Procedures: Visit Diagnoses:  1. Chronic pain of left knee   2. Chronic pain of right knee     Large Joint Inj: R knee on 12/01/2023 8:17 AM Indications: diagnostic evaluation and pain Details: 22 G 1.5 in needle, superolateral approach  Arthrogram: No  Medications: 3 mL lidocaine 1 %; 40 mg methylPREDNISolone acetate 40 MG/ML Outcome: tolerated well, no immediate complications Procedure, treatment alternatives, risks and benefits explained, specific risks discussed. Consent was given by the patient. Immediately prior to procedure a time out was called to verify the correct patient, procedure, equipment, support staff and site/side marked as required. Patient was prepped and draped in the usual sterile fashion.    Large Joint Inj: L knee on 12/01/2023 8:17 AM Indications: diagnostic evaluation and pain Details: 22 G 1.5 in needle, superolateral  approach  Arthrogram: No  Medications: 3 mL lidocaine 1 %; 40 mg methylPREDNISolone acetate 40 MG/ML Outcome: tolerated well, no immediate complications Procedure, treatment alternatives, risks and benefits explained, specific risks discussed. Consent was given by the patient. Immediately prior to procedure a time out was called to verify the correct patient, procedure, equipment, support staff and site/side marked as required. Patient was prepped and draped in the usual sterile fashion.

## 2023-12-07 ENCOUNTER — Ambulatory Visit (INDEPENDENT_AMBULATORY_CARE_PROVIDER_SITE_OTHER): Admitting: Podiatry

## 2023-12-07 ENCOUNTER — Encounter: Payer: Self-pay | Admitting: Podiatry

## 2023-12-07 DIAGNOSIS — D2372 Other benign neoplasm of skin of left lower limb, including hip: Secondary | ICD-10-CM | POA: Diagnosis not present

## 2023-12-07 NOTE — Progress Notes (Signed)
  Subjective:  Patient ID: DEION FORGUE, female    DOB: 04/18/1941,   MRN: [id number]  No chief complaint on file.   83 y.o. female presents for new concern of lesion on the bottom of her left heel that has been present for several months. Relates it has been very painful. Has tried some OTC treatments but no real relief . Denies any other pedal complaints. Denies n/v/f/c.   Past Medical History:  Diagnosis Date   Arthritis    B knees.   Cataract    Glaucoma    Glucose intolerance (impaired glucose tolerance)    Hypertension    Renal insufficiency     Objective:  Physical Exam: Vascular: DP/PT pulses 2/4 bilateral. CFT <3 seconds. Normal hair growth on digits. No edema.  Skin. No lacerations or abrasions bilateral feet. Hyperkeratotic cored lesion noted plantar left heel with disruption of skin lines.  Musculoskeletal: MMT 5/5 bilateral lower extremities in DF, PF, Inversion and Eversion. Deceased ROM in DF of ankle joint.  Neurological: Sensation intact to light touch.   Assessment:   1. Benign neoplasm of skin of foot, left      Plan:  Patient was evaluated and treated and all questions answered. -Discussed benign skin lesions with patient and treatment options.  -Hyperkeratotic tissue was debrided with chisel without incident.  -Applied salycylic acid treatment to area with dressing. Advised to remove bandaging tomorrow.  -Encouraged daily moisturizing -Discussed use of pumice stone -Advised good supportive shoes and inserts -Patient to return to office as needed or sooner if condition worsens.   Jennefer Moats, DPM

## 2023-12-16 DIAGNOSIS — H35363 Drusen (degenerative) of macula, bilateral: Secondary | ICD-10-CM | POA: Diagnosis not present

## 2023-12-16 DIAGNOSIS — H401332 Pigmentary glaucoma, bilateral, moderate stage: Secondary | ICD-10-CM | POA: Diagnosis not present

## 2023-12-16 DIAGNOSIS — H04123 Dry eye syndrome of bilateral lacrimal glands: Secondary | ICD-10-CM | POA: Diagnosis not present

## 2024-01-13 ENCOUNTER — Other Ambulatory Visit: Payer: Self-pay | Admitting: Emergency Medicine

## 2024-01-13 DIAGNOSIS — Z0000 Encounter for general adult medical examination without abnormal findings: Secondary | ICD-10-CM

## 2024-01-18 ENCOUNTER — Ambulatory Visit (INDEPENDENT_AMBULATORY_CARE_PROVIDER_SITE_OTHER): Admitting: Podiatry

## 2024-01-18 DIAGNOSIS — L600 Ingrowing nail: Secondary | ICD-10-CM | POA: Diagnosis not present

## 2024-01-18 NOTE — Progress Notes (Signed)
   Chief Complaint  Patient presents with   Toe Pain    Hallux left - medial border, tender x few weeks, had ingrown procedure done 03/2023-both borders, feels like the tip of the toe is swollen and callused as well, nail has gotten thicker    HPI: 83 y.o. female presenting today for concerns after total temporary nail avulsion to the left hallux nail plate.  Recently seen in the office by another provider and she had a total temporary nail avulsion performed to the left hallux nail plate.  She says that the nail is not painful however she is concerned about the regrowth.  She would like to have it evaluated today.  Past Medical History:  Diagnosis Date   Arthritis    B knees.   Cataract    Glaucoma    Glucose intolerance (impaired glucose tolerance)    Hypertension    Renal insufficiency     Past Surgical History:  Procedure Laterality Date   CATARACT EXTRACTION Right 07-2014   CATARACT EXTRACTION Left 01/16/2016   COLONOSCOPY     10 +yrs ago in [place], normal per pt   VAGINAL DELIVERY     x2    No Known Allergies   Physical Exam: General: The patient is alert and oriented x3 in no acute distress.  Dermatology: Skin is warm, dry and supple bilateral lower extremities.  History of total temporary nail avulsion to the left hallux nail plate.  Regrowth noted.  Vascular: Palpable pedal pulses bilaterally. Capillary refill within normal limits.  No appreciable edema.  No erythema.  Neurological: Grossly intact via light touch  Musculoskeletal Exam: No pedal deformities noted   Assessment/Plan of Care: 1.  History of total temporary nail avulsion left hallux  -Patient evaluated -Mechanical debridement of the nail was performed today using nail nipper.  Smoothed with a rotary bur.  Explained to the patient that the nail should grow out with time -Continue to maintain good foot hygiene -Return to clinic PRN       Dot Gazella, DPM [facility]  Dr. Dot Gazella, DPM    [address] [place], [place] [address]                Office [phone]  Fax [phone]

## 2024-02-02 ENCOUNTER — Ambulatory Visit: Payer: Self-pay | Admitting: Emergency Medicine

## 2024-02-02 ENCOUNTER — Encounter: Payer: Self-pay | Admitting: Emergency Medicine

## 2024-02-02 ENCOUNTER — Ambulatory Visit (INDEPENDENT_AMBULATORY_CARE_PROVIDER_SITE_OTHER): Payer: Medicare Other | Admitting: Emergency Medicine

## 2024-02-02 VITALS — BP 122/68 | HR 83 | Temp 98.3°F | Ht 64.0 in | Wt 210.0 lb

## 2024-02-02 DIAGNOSIS — I10 Essential (primary) hypertension: Secondary | ICD-10-CM

## 2024-02-02 DIAGNOSIS — Z8739 Personal history of other diseases of the musculoskeletal system and connective tissue: Secondary | ICD-10-CM | POA: Diagnosis not present

## 2024-02-02 DIAGNOSIS — N1832 Chronic kidney disease, stage 3b: Secondary | ICD-10-CM | POA: Diagnosis not present

## 2024-02-02 LAB — COMPREHENSIVE METABOLIC PANEL WITH GFR
ALT: 7 U/L (ref 0–35)
AST: 19 U/L (ref 0–37)
Albumin: 4.2 g/dL (ref 3.5–5.2)
Alkaline Phosphatase: 52 U/L (ref 39–117)
BUN: 26 mg/dL — ABNORMAL HIGH (ref 6–23)
CO2: 28 meq/L (ref 19–32)
Calcium: 9.5 mg/dL (ref 8.4–10.5)
Chloride: 105 meq/L (ref 96–112)
Creatinine, Ser: 1.53 mg/dL — ABNORMAL HIGH (ref 0.40–1.20)
GFR: 30.93 mL/min — ABNORMAL LOW (ref >60.00)
Glucose, Bld: 94 mg/dL (ref 70–99)
Potassium: 4.1 meq/L (ref 3.5–5.1)
Sodium: 140 meq/L (ref 135–145)
Total Bilirubin: 1.1 mg/dL (ref 0.2–1.2)
Total Protein: 7.3 g/dL (ref 6.0–8.3)

## 2024-02-02 LAB — LIPID PANEL
Cholesterol: 216 mg/dL — ABNORMAL HIGH (ref 0–200)
HDL: 84.3 mg/dL (ref >39.00)
LDL Cholesterol: 117 mg/dL — ABNORMAL HIGH (ref 0–99)
NonHDL: 131.67
Total CHOL/HDL Ratio: 3
Triglycerides: 75 mg/dL (ref 0.0–149.0)
VLDL: 15 mg/dL (ref 0.0–40.0)

## 2024-02-02 NOTE — Assessment & Plan Note (Signed)
 Was able to follow-up with nephrologist.  No specific treatment. Advised to stay well-hydrated and avoid NSAIDs Has appointment for follow-up next October 2025

## 2024-02-02 NOTE — Assessment & Plan Note (Signed)
 Stable.  Takes Tylenol as needed.  Advised to avoid NSAIDs.

## 2024-02-02 NOTE — Patient Instructions (Signed)
 Health Maintenance After Age 85 After age 4, you are at a higher risk for certain long-term diseases and infections as well as injuries from falls. Falls are a major cause of broken bones and head injuries in people who are older than age 47. Getting regular preventive care can help to keep you healthy and well. Preventive care includes getting regular testing and making lifestyle changes as recommended by your health care provider. Talk with your health care provider about: Which screenings and tests you should have. A screening is a test that checks for a disease when you have no symptoms. A diet and exercise plan that is right for you. What should I know about screenings and tests to prevent falls? Screening and testing are the best ways to find a health problem early. Early diagnosis and treatment give you the best chance of managing medical conditions that are common after age 37. Certain conditions and lifestyle choices may make you more likely to have a fall. Your health care provider may recommend: Regular vision checks. Poor vision and conditions such as cataracts can make you more likely to have a fall. If you wear glasses, make sure to get your prescription updated if your vision changes. Medicine review. Work with your health care provider to regularly review all of the medicines you are taking, including over-the-counter medicines. Ask your health care provider about any side effects that may make you more likely to have a fall. Tell your health care provider if any medicines that you take make you feel dizzy or sleepy. Strength and balance checks. Your health care provider may recommend certain tests to check your strength and balance while standing, walking, or changing positions. Foot health exam. Foot pain and numbness, as well as not wearing proper footwear, can make you more likely to have a fall. Screenings, including: Osteoporosis screening. Osteoporosis is a condition that causes  the bones to get weaker and break more easily. Blood pressure screening. Blood pressure changes and medicines to control blood pressure can make you feel dizzy. Depression screening. You may be more likely to have a fall if you have a fear of falling, feel depressed, or feel unable to do activities that you used to do. Alcohol use screening. Using too much alcohol can affect your balance and may make you more likely to have a fall. Follow these instructions at home: Lifestyle Do not drink alcohol if: Your health care provider tells you not to drink. If you drink alcohol: Limit how much you have to: 0-1 drink a day for women. 0-2 drinks a day for men. Know how much alcohol is in your drink. In the [place] one drink equals one 12 oz bottle of beer (355 mL), one 5 oz glass of wine (148 mL), or one 1 oz glass of hard liquor (44 mL). Do not use any products that contain nicotine or tobacco. These products include cigarettes, chewing tobacco, and vaping devices, such as e-cigarettes. If you need help quitting, ask your health care provider. Activity  Follow a regular exercise program to stay fit. This will help you maintain your balance. Ask your health care provider what types of exercise are appropriate for you. If you need a cane or walker, use it as recommended by your health care provider. Wear supportive shoes that have nonskid soles. Safety  Remove any tripping hazards, such as rugs, cords, and clutter. Install safety equipment such as grab bars in bathrooms and safety rails on stairs. Keep rooms and walkways  well-lit. General instructions Talk with your health care provider about your risks for falling. Tell your health care provider if: You fall. Be sure to tell your health care provider about all falls, even ones that seem minor. You feel dizzy, tiredness (fatigue), or off-balance. Take over-the-counter and prescription medicines only as told by your health care provider. These include  supplements. Eat a healthy diet and maintain a healthy weight. A healthy diet includes low-fat dairy products, low-fat (lean) meats, and fiber from whole grains, beans, and lots of fruits and vegetables. Stay current with your vaccines. Schedule regular health, dental, and eye exams. Summary Having a healthy lifestyle and getting preventive care can help to protect your health and wellness after age 11. Screening and testing are the best way to find a health problem early and help you avoid having a fall. Early diagnosis and treatment give you the best chance for managing medical conditions that are more common for people who are older than age 28. Falls are a major cause of broken bones and head injuries in people who are older than age 48. Take precautions to prevent a fall at home. Work with your health care provider to learn what changes you can make to improve your health and wellness and to prevent falls. This information is not intended to replace advice given to you by your health care provider. Make sure you discuss any questions you have with your health care provider. Document Revised: 12/23/2020 Document Reviewed: 12/23/2020 Elsevier Patient Education  2024 [employer].

## 2024-02-02 NOTE — Progress Notes (Signed)
 Tammy Mccullough 83 y.o.   Chief Complaint  Patient presents with   Follow-up    6 month f/u for HTN. Patient states b/p has been good at home and she has been taking her meds every day. No other concerns    HISTORY OF PRESENT ILLNESS: This is a 83 y.o. female here for follow-up of chronic medical conditions including hypertension. Overall doing well.  Stays physically and mentally active. Eating and sleeping well. Compliant with medications.  Has no complaints or any other medical concerns today.  HPI   Prior to Admission medications   Medication Sig Start Date End Date Taking? Authorizing Provider  acetaminophen  (TYLENOL ) 500 MG tablet Take 500 mg by mouth every 8 (eight) hours as needed.   Yes [provider]  amLODipine  (NORVASC ) 5 MG tablet TAKE 1 TABLET(5 MG) BY MOUTH DAILY 07/12/23  Yes Rylend Pietrzak Jose, MD  diclofenac  Sodium (VOLTAREN ) 1 % GEL Apply 2 g topically 4 (four) times daily. 08/28/20  Yes Benjiman Bras, MD  IYUZEH 0.005 % SOLN SMARTSIG:1 Drop(s) In Eye(s) Every Evening 07/12/23  Yes [provider]  olmesartan -hydrochlorothiazide  (BENICAR  HCT) 40-25 MG tablet TAKE 1 TABLET BY MOUTH DAILY 08/05/23  Yes Jahsiah Carpenter, Isidro Margo, MD  Polyethyl Glycol-Propyl Glycol (SYSTANE HYDRATION PF OP) Apply to eye. Dry eyes   Yes [provider]  Povidone (IVIZIA DRY EYES OP) Apply 1 drop to eye as needed.   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   Cataract    Glaucoma    Glucose intolerance (impaired glucose tolerance)    Hypertension    Renal insufficiency     Past Surgical History:  Procedure Laterality Date   CATARACT EXTRACTION Right 07-2014   CATARACT EXTRACTION Left 01/16/2016   COLONOSCOPY     10 +yrs ago in [place], normal per pt   VAGINAL DELIVERY     x2    Social History   Socioeconomic History   Marital status: Widowed    Spouse name: Not on file   Number of children: 2   Years of education: Not on file   Highest education level: Not on file  Occupational History   Occupation: retired  Tobacco Use   Smoking status: Never   Smokeless tobacco: Never  Vaping Use   Vaping status: Never Used  Substance and Sexual Activity   Alcohol use: Not Currently    Alcohol/week: 6.0 standard drinks of alcohol    Types: 6 Standard drinks or equivalent per week    Comment: 2-3   Drug use: No   Sexual activity: Not Currently  Other Topics Concern   Not on file  Social History Narrative   Marital status: widowed since 2010 after  15 years of marriage.  Not dating but would like to be.      Children:  2 Children; no grandchildren; 2 adopted grandchildren.  One son is disabled; patient supports; son is obese.      Living: alone with 1 cat.      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      Tobacco: never      Alcohol: weekends liquor; some during the week.  3-5 cups per week.      Drugs: none      Exercise: water aerobics three days per week.  [employer].      Seatbelt: 100%;      Guns: none      ADLs:: independent with ADLs.   Drives. No assistant devices.      Advanced Directives:  yes; healthcare POA:  Older brother [name]);  FULL CODE but no prolonged measures.     Social Drivers of Corporate investment banker Strain: Low Risk  (09/10/2023)   Overall Financial Resource Strain (CARDIA)    Difficulty of Paying Living Expenses: Not hard at all  Food Insecurity: No Food Insecurity (09/10/2023)   Hunger Vital Sign    Worried About Running Out of Food in the Last Year: Never true    Ran Out of Food in the Last Year: Never true  Transportation Needs: No Transportation Needs (09/10/2023)   PRAPARE - Administrator, Civil Service (Medical): No    Lack of Transportation (Non-Medical): No  Physical Activity: Insufficiently Active (09/10/2023)   Exercise Vital Sign    Days of Exercise per Week: 7 days    Minutes of Exercise per Session: 10 min  Stress: No Stress Concern Present (09/10/2023)   [employer] of Occupational Health - Occupational Stress Questionnaire    Feeling of Stress : Not at all  Social Connections: Socially Isolated (09/10/2023)   Social Connection and Isolation Panel    Frequency of Communication with Friends and Family: More than three times a week    Frequency of Social Gatherings with Friends and Family: More than three times a week    Attends Religious Services: Never    Database administrator or Organizations: No    Attends Banker Meetings: Never    Marital Status: Widowed  Intimate Partner Violence: Not At Risk (09/10/2023)   Humiliation, Afraid, Rape, and Kick questionnaire    Fear of Current or Ex-Partner: No    Emotionally Abused: No    Physically Abused: No    Sexually Abused: No    Family History  Problem Relation Age of Onset   Diabetes Mother    Heart disease Mother        unsure   COPD Father    Diabetes Sister    Hypertension Sister    Kidney disease Sister    Diabetes Brother    Diabetes Brother    Colon cancer Neg Hx    Rectal cancer Neg Hx    Stomach cancer Neg Hx      Review of  Systems  Constitutional: Negative.   HENT: Negative.  Negative for congestion and sore throat.   Respiratory: Negative.  Negative for cough and shortness of breath.   Cardiovascular: Negative.  Negative for chest pain and palpitations.  Gastrointestinal:  Negative for abdominal pain, diarrhea, nausea and vomiting.  Genitourinary: Negative.  Negative for dysuria and hematuria.  Skin: Negative.  Negative for  rash.  Neurological: Negative.  Negative for dizziness and headaches.  All other systems reviewed and are negative.   Vitals:   02/02/24 0835  BP: 122/68  Pulse: 83  Temp: 98.3 F (36.8 C)  SpO2: 98%    Physical Exam Vitals reviewed.  Constitutional:      Appearance: Normal appearance.  HENT:     Head: Normocephalic.     Mouth/Throat:     Mouth: Mucous membranes are moist.     Pharynx: Oropharynx is clear.   Eyes:     Extraocular Movements: Extraocular movements intact.     Conjunctiva/sclera: Conjunctivae normal.     Pupils: Pupils are equal, round, and reactive to light.    Cardiovascular:     Rate and Rhythm: Normal rate and regular rhythm.     Pulses: Normal pulses.     Heart sounds: Normal heart sounds.  Pulmonary:     Effort: Pulmonary effort is normal.     Breath sounds: Normal breath sounds.   Musculoskeletal:     Cervical back: No tenderness.  Lymphadenopathy:     Cervical: No cervical adenopathy.   Skin:    General: Skin is warm and dry.     Capillary Refill: Capillary refill takes less than 2 seconds.   Neurological:     General: No focal deficit present.     Mental Status: She is alert and oriented to person, place, and time.   Psychiatric:        Mood and Affect: Mood normal.        Behavior: Behavior normal.      ASSESSMENT & PLAN: A total of 44 minutes was spent with the patient and counseling/coordination of care regarding preparing for this visit, review of most recent office visit notes, review of multiple chronic medical  conditions and their management, review of all medications, review of most recent bloodwork results, review of health maintenance items, education on nutrition, prognosis, documentation, and need for follow up.   Problem List Items Addressed This Visit       Cardiovascular and Mediastinum   HTN (hypertension) - Primary   Well-controlled hypertension. Continue Benicar  HCT 40-25 mg daily on amlodipine  5 mg daily Dietary approaches to stop hypertension discussed      Relevant Orders   Comprehensive metabolic panel with GFR   Lipid panel     Genitourinary   Chronic renal insufficiency   Was able to follow-up with nephrologist.  No specific treatment. Advised to stay well-hydrated and avoid NSAIDs Has appointment for follow-up next October 2025        Relevant Orders   Comprehensive metabolic panel with GFR   Lipid panel     Other   History of osteoarthritis   Stable.  Takes Tylenol  as needed.  Advised to avoid NSAIDs.      Patient Instructions  Health Maintenance After Age 54 After age 21, you are at a higher risk for certain long-term diseases and infections as well as injuries from falls. Falls are a major cause of broken bones and head injuries in people who are older than age 13. Getting regular preventive care can help to keep you healthy and well. Preventive care includes getting regular testing and making lifestyle changes as recommended by your health care provider. Talk with your health care provider about: Which screenings and tests you should have. A screening is a test that checks for a disease when you have no symptoms. A diet and exercise plan that is right for  you. What should I know about screenings and tests to prevent falls? Screening and testing are the best ways to find a health problem early. Early diagnosis and treatment give you the best chance of managing medical conditions that are common after age 28. Certain conditions and lifestyle choices may make you  more likely to have a fall. Your health care provider may recommend: Regular vision checks. Poor vision and conditions such as cataracts can make you more likely to have a fall. If you wear glasses, make sure to get your prescription updated if your vision changes. Medicine review. Work with your health care provider to regularly review all of the medicines you are taking, including over-the-counter medicines. Ask your health care provider about any side effects that may make you more likely to have a fall. Tell your health care provider if any medicines that you take make you feel dizzy or sleepy. Strength and balance checks. Your health care provider may recommend certain tests to check your strength and balance while standing, walking, or changing positions. Foot health exam. Foot pain and numbness, as well as not wearing proper footwear, can make you more likely to have a fall. Screenings, including: Osteoporosis screening. Osteoporosis is a condition that causes the bones to get weaker and break more easily. Blood pressure screening. Blood pressure changes and medicines to control blood pressure can make you feel dizzy. Depression screening. You may be more likely to have a fall if you have a fear of falling, feel depressed, or feel unable to do activities that you used to do. Alcohol use screening. Using too much alcohol can affect your balance and may make you more likely to have a fall. Follow these instructions at home: Lifestyle Do not drink alcohol if: Your health care provider tells you not to drink. If you drink alcohol: Limit how much you have to: 0-1 drink a day for women. 0-2 drinks a day for men. Know how much alcohol is in your drink. In the [place] one drink equals one 12 oz bottle of beer (355 mL), one 5 oz glass of wine (148 mL), or one 1 oz glass of hard liquor (44 mL). Do not use any products that contain nicotine or tobacco. These products include cigarettes, chewing  tobacco, and vaping devices, such as e-cigarettes. If you need help quitting, ask your health care provider. Activity  Follow a regular exercise program to stay fit. This will help you maintain your balance. Ask your health care provider what types of exercise are appropriate for you. If you need a cane or walker, use it as recommended by your health care provider. Wear supportive shoes that have nonskid soles. Safety  Remove any tripping hazards, such as rugs, cords, and clutter. Install safety equipment such as grab bars in bathrooms and safety rails on stairs. Keep rooms and walkways well-lit. General instructions Talk with your health care provider about your risks for falling. Tell your health care provider if: You fall. Be sure to tell your health care provider about all falls, even ones that seem minor. You feel dizzy, tiredness (fatigue), or off-balance. Take over-the-counter and prescription medicines only as told by your health care provider. These include supplements. Eat a healthy diet and maintain a healthy weight. A healthy diet includes low-fat dairy products, low-fat (lean) meats, and fiber from whole grains, beans, and lots of fruits and vegetables. Stay current with your vaccines. Schedule regular health, dental, and eye exams. Summary Having a healthy lifestyle and getting  preventive care can help to protect your health and wellness after age 62. Screening and testing are the best way to find a health problem early and help you avoid having a fall. Early diagnosis and treatment give you the best chance for managing medical conditions that are more common for people who are older than age 59. Falls are a major cause of broken bones and head injuries in people who are older than age 43. Take precautions to prevent a fall at home. Work with your health care provider to learn what changes you can make to improve your health and wellness and to prevent falls. This information is  not intended to replace advice given to you by your health care provider. Make sure you discuss any questions you have with your health care provider. Document Revised: 12/23/2020 Document Reviewed: 12/23/2020 Elsevier Patient Education  2024 Elsevier Inc.   Maryagnes Small, MD [facility]

## 2024-02-02 NOTE — Assessment & Plan Note (Signed)
Well-controlled hypertension. Continue Benicar HCT 40-25 mg daily on amlodipine 5 mg daily Dietary approaches to stop hypertension discussed

## 2024-02-14 ENCOUNTER — Ambulatory Visit
Admission: RE | Admit: 2024-02-14 | Discharge: 2024-02-14 | Disposition: A | Discharge status: Home / Self Care | Source: Ambulatory Visit | Attending: Emergency Medicine

## 2024-02-14 DIAGNOSIS — Z1231 Encounter for screening mammogram for malignant neoplasm of breast: Secondary | ICD-10-CM | POA: Diagnosis not present

## 2024-02-14 DIAGNOSIS — Z0000 Encounter for general adult medical examination without abnormal findings: Secondary | ICD-10-CM

## 2024-02-16 ENCOUNTER — Ambulatory Visit: Payer: Self-pay | Admitting: Emergency Medicine

## 2024-02-17 DIAGNOSIS — R262 Difficulty in walking, not elsewhere classified: Secondary | ICD-10-CM | POA: Diagnosis not present

## 2024-02-17 DIAGNOSIS — M25562 Pain in left knee: Secondary | ICD-10-CM | POA: Diagnosis not present

## 2024-02-17 DIAGNOSIS — M25561 Pain in right knee: Secondary | ICD-10-CM | POA: Diagnosis not present

## 2024-02-17 DIAGNOSIS — M170 Bilateral primary osteoarthritis of knee: Secondary | ICD-10-CM | POA: Diagnosis not present

## 2024-02-24 DIAGNOSIS — R262 Difficulty in walking, not elsewhere classified: Secondary | ICD-10-CM | POA: Diagnosis not present

## 2024-02-24 DIAGNOSIS — M25562 Pain in left knee: Secondary | ICD-10-CM | POA: Diagnosis not present

## 2024-02-24 DIAGNOSIS — M170 Bilateral primary osteoarthritis of knee: Secondary | ICD-10-CM | POA: Diagnosis not present

## 2024-02-24 DIAGNOSIS — M25561 Pain in right knee: Secondary | ICD-10-CM | POA: Diagnosis not present

## 2024-02-29 DIAGNOSIS — H401332 Pigmentary glaucoma, bilateral, moderate stage: Secondary | ICD-10-CM | POA: Diagnosis not present

## 2024-02-29 DIAGNOSIS — H35363 Drusen (degenerative) of macula, bilateral: Secondary | ICD-10-CM | POA: Diagnosis not present

## 2024-02-29 DIAGNOSIS — H04123 Dry eye syndrome of bilateral lacrimal glands: Secondary | ICD-10-CM | POA: Diagnosis not present

## 2024-03-02 ENCOUNTER — Ambulatory Visit (INDEPENDENT_AMBULATORY_CARE_PROVIDER_SITE_OTHER): Admitting: Physician Assistant

## 2024-03-02 ENCOUNTER — Other Ambulatory Visit (INDEPENDENT_AMBULATORY_CARE_PROVIDER_SITE_OTHER): Payer: Self-pay

## 2024-03-02 ENCOUNTER — Encounter: Payer: Self-pay | Admitting: Physician Assistant

## 2024-03-02 DIAGNOSIS — G8929 Other chronic pain: Secondary | ICD-10-CM

## 2024-03-02 DIAGNOSIS — M25561 Pain in right knee: Secondary | ICD-10-CM

## 2024-03-02 DIAGNOSIS — M25562 Pain in left knee: Secondary | ICD-10-CM

## 2024-03-02 NOTE — Progress Notes (Signed)
 HPI: Mrs. Deacon comes in today with questions about her knees.  She states the cortisone injections 12/01/2023 are still helping with her knee.  She has been any questions in regards to how often she can get injections.  Also wants to discuss viscosupplementation injections versus cortisone injections.  She has had no new injury to either knee.  Review of systems: See HPI otherwise negative or noncontributory.  Physical exam: General Well-developed well-nourished female in no acute distress. Psych: Alert and oriented x 3.  Radiographs: Right knee: Tricompartmental arthritis with bone-on-bone medial compartment.  Moderate to severe narrowing lateral compartment.  Severe patellofemoral arthritic changes with periarticular spurring.  No acute fractures.  Knee is well located.  Slight varus deformity.  Left knee: Tricompartmental arthritis.  No acute fractures acute findings.  Near bone-on-bone medial lateral compartment.  Severe patellofemoral arthritic changes with large periarticular spurring.  Knee is well located.  Impression: Tricompartmental arthritis both knees  Plan: At this point in time she is still getting some relief with the cortisone injection that she had in both knees back in April.  Therefore we will hold off any type of injection today.  She understands that if she is at this point time able to have either viscosupplementation or cortisone injections of either knee.  Will require R3 sedation for viscosupplementation injections.  She understands 3 months between cortisone injection 6 months between viscosupplementation injections.  Follow-up as needed.  Questions were encouraged and answered at length.

## 2024-03-03 DIAGNOSIS — M25561 Pain in right knee: Secondary | ICD-10-CM | POA: Diagnosis not present

## 2024-03-03 DIAGNOSIS — M25562 Pain in left knee: Secondary | ICD-10-CM | POA: Diagnosis not present

## 2024-03-03 DIAGNOSIS — M170 Bilateral primary osteoarthritis of knee: Secondary | ICD-10-CM | POA: Diagnosis not present

## 2024-03-03 DIAGNOSIS — R262 Difficulty in walking, not elsewhere classified: Secondary | ICD-10-CM | POA: Diagnosis not present

## 2024-03-10 DIAGNOSIS — M25562 Pain in left knee: Secondary | ICD-10-CM | POA: Diagnosis not present

## 2024-03-10 DIAGNOSIS — M25561 Pain in right knee: Secondary | ICD-10-CM | POA: Diagnosis not present

## 2024-03-10 DIAGNOSIS — M170 Bilateral primary osteoarthritis of knee: Secondary | ICD-10-CM | POA: Diagnosis not present

## 2024-03-10 DIAGNOSIS — R262 Difficulty in walking, not elsewhere classified: Secondary | ICD-10-CM | POA: Diagnosis not present

## 2024-03-22 DIAGNOSIS — M25562 Pain in left knee: Secondary | ICD-10-CM | POA: Diagnosis not present

## 2024-03-22 DIAGNOSIS — R262 Difficulty in walking, not elsewhere classified: Secondary | ICD-10-CM | POA: Diagnosis not present

## 2024-03-22 DIAGNOSIS — M25561 Pain in right knee: Secondary | ICD-10-CM | POA: Diagnosis not present

## 2024-03-22 DIAGNOSIS — M170 Bilateral primary osteoarthritis of knee: Secondary | ICD-10-CM | POA: Diagnosis not present

## 2024-04-03 ENCOUNTER — Encounter: Payer: Self-pay | Admitting: Emergency Medicine

## 2024-04-03 ENCOUNTER — Ambulatory Visit: Admission: EM | Admit: 2024-04-03 | Discharge: 2024-04-03 | Disposition: A | Discharge status: Home / Self Care

## 2024-04-03 DIAGNOSIS — G8929 Other chronic pain: Secondary | ICD-10-CM

## 2024-04-03 DIAGNOSIS — M25561 Pain in right knee: Secondary | ICD-10-CM

## 2024-04-03 DIAGNOSIS — M170 Bilateral primary osteoarthritis of knee: Secondary | ICD-10-CM

## 2024-04-03 DIAGNOSIS — M25562 Pain in left knee: Secondary | ICD-10-CM

## 2024-04-03 NOTE — ED Triage Notes (Signed)
 Pt st's she started getting knee injections from a new place last week   Pt st's that night after getting the injections she started having burning in bil leg from knee up to thigh.  Pt st's thy only burn at night

## 2024-04-03 NOTE — Discharge Instructions (Addendum)
 You have a history of arthritis in both knees and were seen today for new leg discomfort. You received cortisone injections in April, and on July 17th your orthopedic provider, Mr. Gretta, reviewed your x-rays which showed advanced arthritis in both knees. At that visit, you discussed viscosupplementation injections. Mr. Gretta explained that you should receive either viscosupplementation injections or cortisone injections, but not both. The decision was made not to do a cortisone injection at that time while you were considering the knee clinic for viscosupplementation.  Today you reported that you have had a total of four injections in both knees over the past month at the knee clinic, with the last one on August 13th. According to Mr. Gretta, viscosupplementation injections should only be given once every six months. Since your last injection, you have noticed burning sensations in your legs at night that spread from the knees to the thighs or down the lower legs. These symptoms may be related to the injections or could be due to another condition such as restless leg syndrome.  You should follow up with Mr. Gretta to review your recent injections and discuss your new symptoms. In the meantime, you may take over-the-counter pain relievers such as acetaminophen  or ibuprofen as directed, try gentle stretching before bedtime, and use a warm compress or heating pad to help with nighttime discomfort. Avoiding caffeine in the evening and keeping a regular bedtime routine may also improve your sleep and reduce these symptoms.  Please call your primary care provider if your symptoms do not improve or continue to worsen, or if you need further evaluation for restless leg syndrome. Go to the emergency room right away if you develop severe or quickly worsening pain, new weakness, numbness, trouble walking, or sudden changes in your ability to move or feel your legs.

## 2024-04-03 NOTE — ED Provider Notes (Signed)
 [facility]    CSN: [id number] Arrival date & time: 04/03/24  0810      History   Chief Complaint Chief Complaint  Patient presents with   Knee Pain    HPI Tammy Mccullough is a 83 y.o. female.   Discussed the use of AI scribe software for clinical note transcription with the patient, who gave verbal consent to proceed.   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 She has previously received cortisone injections, with the last injection in April providing good relief.  Over the past month, she underwent a series of four Visco Supplementation Gel Injections at the [facility] in [place], with the most recent injection administered on August 13th. Since that injection, she reports the onset of nocturnal leg pain described as burning sensations that sometimes radiate from the knees to the thighs. She denies any associated numbness or weakness.  The patient reports little to no benefit from the gel injections and expresses concern that she may have received too many. She states she pursued this therapy after hearing about it from her niece but was uncertain about its appropriateness. Review of her chart shows she was last evaluated by her orthopedic PA on July 17th, when x-rays were obtained. During that visit, she inquired about injections and was advised that she should choose either cortisone injections or viscosupplementation, but not both. The PA documented that cortisone injections may be given every three months, while viscosupplementation should be limited to every six months. Although she was eligible for a cortisone injection at that visit, it was not administered because she was still considering viscosupplementation therapy.  The following portions of the patient's history were reviewed and updated as appropriate:  allergies, current medications, past family history, past medical history, past social history, past surgical history, and problem list.      Past Medical History:  Diagnosis Date   Arthritis    B knees.   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      Home Medications    Prior to Admission medications   Medication Sig Start Date End Date Taking? Authorizing Provider  acetaminophen  (TYLENOL ) 500 MG tablet Take 500 mg by mouth every 8 (eight) hours as needed.    [provider]  amLODipine  (NORVASC ) 5 MG tablet TAKE 1 TABLET(5 MG) BY MOUTH DAILY  07/12/23   Sagardia, Miguel Jose, MD  diclofenac  Sodium (VOLTAREN ) 1 % GEL Apply 2 g topically 4 (four) times daily. 08/28/20   Levora Reyes SAUNDERS, MD  IYUZEH 0.005 % SOLN SMARTSIG:1 Drop(s) In Eye(s) Every Evening 07/12/23   [provider]  olmesartan -hydrochlorothiazide  (BENICAR  HCT) 40-25 MG tablet TAKE 1 TABLET BY MOUTH DAILY 08/05/23   Sagardia, Emil Schanz, MD   Polyethyl Glycol-Propyl Glycol (SYSTANE HYDRATION PF OP) Apply to eye. Dry eyes    [provider]  Povidone (IVIZIA DRY EYES OP) Apply 1 drop to eye as needed.    [provider]  ZIOPTAN 0.0015 % SOLN  09/26/16   [provider]    Family History Family History  Problem Relation Age of Onset   Diabetes Mother    Heart disease Mother        unsure   COPD Father    Diabetes Sister    Hypertension Sister    Kidney disease Sister    Diabetes Brother    Diabetes Brother    Colon cancer Neg Hx    Rectal cancer Neg Hx    Stomach cancer Neg Hx     Social History Social History   Tobacco Use   Smoking status: Never   Smokeless tobacco: Never  Vaping Use   Vaping status: Never Used  Substance Use Topics   Alcohol use: Not Currently    Alcohol/week: 6.0 standard drinks of alcohol    Types: 6 Standard drinks or equivalent per week    Comment: 2-3   Drug use: No     Allergies   Patient has no known allergies.   Review of Systems Review of Systems  Musculoskeletal:  Positive for arthralgias. Negative for gait problem and joint swelling.  Neurological:  Negative for weakness and numbness.  All other systems reviewed and are negative.    Physical Exam Triage Vital Signs ED Triage Vitals  Encounter Vitals Group     BP 04/03/24 0821 118/75     Girls Systolic BP Percentile --      Girls Diastolic BP Percentile --      Boys Systolic BP Percentile --      Boys Diastolic BP Percentile --      Pulse Rate 04/03/24 0821 82     Resp 04/03/24 0821 20     Temp 04/03/24 0821 97.8 F (36.6 C)     Temp Source 04/03/24 0821 Oral     SpO2 04/03/24 0821 95 %     Weight --      Height --      Head Circumference --      Peak Flow --      Pain Score 04/03/24 0824 0     Pain Loc --      Pain Education --      Exclude from Growth Chart --    No data found.  Updated Vital Signs BP 118/75 (BP Location: Left Arm)   Pulse 82   Temp 97.8 F (36.6 C)  (Oral)   Resp 20   SpO2 95%   Visual Acuity Right Eye Distance:   Left Eye Distance:   Bilateral Distance:    Right Eye Near:   Left Eye Near:    Bilateral Near:     Physical Exam Vitals reviewed.  Constitutional:      General: She is awake. She is not in acute distress.    Appearance: Normal appearance. She is well-developed. She is not ill-appearing, toxic-appearing  or diaphoretic.  HENT:     Head: Normocephalic.     Right Ear: Hearing normal.     Left Ear: Hearing normal.     Nose: Nose normal.     Mouth/Throat:     Mouth: Mucous membranes are moist.  Eyes:     General: Vision grossly intact.     Conjunctiva/sclera: Conjunctivae normal.  Cardiovascular:     Rate and Rhythm: Normal rate and regular rhythm.     Heart sounds: Normal heart sounds.  Pulmonary:     Effort: Pulmonary effort is normal.     Breath sounds: Normal breath sounds and air entry.  Musculoskeletal:        General: Normal range of motion.     Cervical back: Full passive range of motion without pain, normal range of motion and neck supple.     Right knee: No swelling, deformity, effusion, erythema, bony tenderness or crepitus. Normal range of motion. No tenderness. Normal alignment, normal meniscus and normal patellar mobility.     Left knee: No swelling, deformity, effusion, erythema, bony tenderness or crepitus. Normal range of motion. No tenderness. Normal alignment, normal meniscus and normal patellar mobility.  Skin:    General: Skin is warm and dry.  Neurological:     General: No focal deficit present.     Mental Status: She is alert and oriented to person, place, and time.     Sensory: Sensation is intact. No sensory deficit.     Motor: Motor function is intact. No weakness.     Gait: Gait is intact.  Psychiatric:        Speech: Speech normal.        Behavior: Behavior is cooperative.      UC Treatments / Results  Labs (all labs ordered are listed, but only abnormal results are  displayed) Labs Reviewed - No data to display  EKG   Radiology No results found.  Procedures Procedures (including critical care time)  Medications Ordered in UC Medications - No data to display  Initial Impression / Assessment and Plan / UC Course  I have reviewed the triage vital signs and the nursing notes.  Pertinent labs & imaging results that were available during my care of the patient were reviewed by me and considered in my medical decision making (see chart for details).    The patient has a history of bilateral tricompartmental knee osteoarthritis with bone-on-bone changes confirmed by x-rays on July 17th. She previously obtained relief from cortisone injections, most recently in April, and has since undergone a series of four hyaluronic acid (Visco Supplementation Gel) injections at the [facility] in [place], with the last dose on August 13th. She is now concerned that she may have received too many injections. Current management includes continued orthopedic follow-up with PA Bertrum Gaskins to review recent treatments and assess current symptoms.  Following her most recent injection, the patient reports new onset nocturnal leg pain described as burning sensations radiating from the knees both upward toward the thighs and downward to the lower legs. She denies associated numbness or weakness. These symptoms may represent an adverse effect of the injections or a separate process such as restless leg syndrome. She should review these symptoms with her orthopedic provider, who can evaluate for injection-related complications and consider further assessment for restless leg syndrome if symptoms persist. The patient should return to her primary care provider or seek emergency evaluation if she develops worsening pain, new weakness, numbness, inability to  walk, or any other concerning neurological changes.  Today's evaluation has revealed no signs of a dangerous  process. Discussed diagnosis with patient and/or guardian. Patient and/or guardian aware of their diagnosis, possible red flag symptoms to watch out for and need for close follow up. Patient and/or guardian understands verbal and written discharge instructions. Patient and/or guardian comfortable with plan and disposition.  Patient and/or guardian has a clear mental status at this time, good insight into illness (after discussion and teaching) and has clear judgment to make decisions regarding their care  Documentation was completed with the aid of voice recognition software. Transcription may contain typographical errors.  Final Clinical Impressions(s) / UC Diagnoses   Final diagnoses:  Chronic pain of both knees  Tricompartment osteoarthritis of both knees     Discharge Instructions      You have a history of arthritis in both knees and were seen today for new leg discomfort. You received cortisone injections in April, and on July 17th your orthopedic provider, Mr. Gretta, reviewed your x-rays which showed advanced arthritis in both knees. At that visit, you discussed viscosupplementation injections. Mr. Gretta explained that you should receive either viscosupplementation injections or cortisone injections, but not both. The decision was made not to do a cortisone injection at that time while you were considering the knee clinic for viscosupplementation.  Today you reported that you have had a total of four injections in both knees over the past month at the knee clinic, with the last one on August 13th. According to Mr. Gretta, viscosupplementation injections should only be given once every six months. Since your last injection, you have noticed burning sensations in your legs at night that spread from the knees to the thighs or down the lower legs. These symptoms may be related to the injections or could be due to another condition such as restless leg syndrome.  You should follow up with Mr.  Gretta to review your recent injections and discuss your new symptoms. In the meantime, you may take over-the-counter pain relievers such as acetaminophen  or ibuprofen as directed, try gentle stretching before bedtime, and use a warm compress or heating pad to help with nighttime discomfort. Avoiding caffeine in the evening and keeping a regular bedtime routine may also improve your sleep and reduce these symptoms.  Please call your primary care provider if your symptoms do not improve or continue to worsen, or if you need further evaluation for restless leg syndrome. Go to the emergency room right away if you develop severe or quickly worsening pain, new weakness, numbness, trouble walking, or sudden changes in your ability to move or feel your legs.      ED Prescriptions   None    PDMP not reviewed this encounter.   Iola Lukes, [place] 04/03/24 1919

## 2024-04-19 DIAGNOSIS — H35363 Drusen (degenerative) of macula, bilateral: Secondary | ICD-10-CM | POA: Diagnosis not present

## 2024-04-19 DIAGNOSIS — H401332 Pigmentary glaucoma, bilateral, moderate stage: Secondary | ICD-10-CM | POA: Diagnosis not present

## 2024-04-19 DIAGNOSIS — H04123 Dry eye syndrome of bilateral lacrimal glands: Secondary | ICD-10-CM | POA: Diagnosis not present

## 2024-04-19 DIAGNOSIS — H02889 Meibomian gland dysfunction of unspecified eye, unspecified eyelid: Secondary | ICD-10-CM | POA: Diagnosis not present

## 2024-04-24 ENCOUNTER — Ambulatory Visit (INDEPENDENT_AMBULATORY_CARE_PROVIDER_SITE_OTHER): Admitting: Physician Assistant

## 2024-04-24 DIAGNOSIS — M1712 Unilateral primary osteoarthritis, left knee: Secondary | ICD-10-CM | POA: Diagnosis not present

## 2024-04-24 DIAGNOSIS — M1711 Unilateral primary osteoarthritis, right knee: Secondary | ICD-10-CM

## 2024-04-24 NOTE — Progress Notes (Signed)
 HPI: Tammy Mccullough returns today due to bilateral leg pain.  She went to the knee pain center as seen on TV was given some type of viscosupplementation injection in her knees.  Since that time she has had burning in her knees but this stopped 2 nights ago.  She does note some bone-on-bone sensation at times in the right knee but left knee overall is doing well.  She has had no falls no injuries.  She is using no assistive device to ambulate.  We last provided a cortisone injection in her knee on 12/01/2023 bilateral knees which gave her decent relief until recently.  Review of systems: See HPI otherwise negative  Physical exam: General Well-developed well-nourished female no acute distress ambulates without any assistive device. Bilateral knees: No abnormal warmth, edema or effusion.  Good range of motion of both knees.  Significant crepitus left knee.  Nontender medial lateral joint line both knees.  No gross instability valgus varus stressing of either knee.  Impression: Right knee arthritis Left knee arthritis  Plan: Given the fact that she is no longer having any burning knees and overall her knees are doing well we will hold on any type of injection.  She knows that she could have a cortisone injection in either knee or both knees at any time as this been more than 3 months.  Questions were encouraged and answered at length.  Follow-up as needed.

## 2024-05-08 ENCOUNTER — Ambulatory Visit (INDEPENDENT_AMBULATORY_CARE_PROVIDER_SITE_OTHER): Admitting: Physician Assistant

## 2024-05-08 ENCOUNTER — Encounter: Payer: Self-pay | Admitting: Physician Assistant

## 2024-05-08 DIAGNOSIS — M170 Bilateral primary osteoarthritis of knee: Secondary | ICD-10-CM | POA: Diagnosis not present

## 2024-05-08 DIAGNOSIS — M1711 Unilateral primary osteoarthritis, right knee: Secondary | ICD-10-CM

## 2024-05-08 DIAGNOSIS — M1712 Unilateral primary osteoarthritis, left knee: Secondary | ICD-10-CM

## 2024-05-08 MED ORDER — METHYLPREDNISOLONE ACETATE 40 MG/ML IJ SUSP
40.0000 mg | INTRAMUSCULAR | Status: AC | PRN
  Administered 2024-05-08: 40 mg via INTRA_ARTICULAR

## 2024-05-08 MED ORDER — LIDOCAINE HCL 1 % IJ SOLN
3.0000 mL | INTRAMUSCULAR | Status: AC | PRN
  Administered 2024-05-08: 3 mL

## 2024-05-08 NOTE — Progress Notes (Signed)
   Procedure Note  Patient: Tammy Mccullough             Date of Birth: 1941-05-04           MRN: [id number]             Visit Date: 05/08/2024 HPI: Mrs. Tammy Mccullough comes in today requesting bilateral knee injections.  She has known arthritis of both knees.  She recently underwent some type of viscosupplementation injection in both knees.  Last cortisone injections both knees were 12/01/2023.  She has had no new injury to either knee.  Nondiabetic.  Review of systems: Negative for fevers chills.  Physical exam: Bilateral knees good range of motion both knees.  Crepitus left greater than right knee.  No abnormal warmth erythema or effusion of either knee.  Procedures: Visit Diagnoses:  1. Unilateral primary osteoarthritis, left knee   2. Unilateral primary osteoarthritis, right knee     Large Joint Inj: bilateral knee on 05/08/2024 9:54 AM Indications: pain Details: 22 G 1.5 in needle, anterolateral approach  Arthrogram: No  Medications (Right): 3 mL lidocaine  1 %; 40 mg methylPREDNISolone  acetate 40 MG/ML Medications (Left): 3 mL lidocaine  1 %; 40 mg methylPREDNISolone  acetate 40 MG/ML Outcome: tolerated well, no immediate complications Procedure, treatment alternatives, risks and benefits explained, specific risks discussed. Consent was given by the patient. Immediately prior to procedure a time out was called to verify the correct patient, procedure, equipment, support staff and site/side marked as required. Patient was prepped and draped in the usual sterile fashion.     Plan: She will follow-up with us  as needed knows to wait at least 3 months between cortisone injections.  Questions were encouraged and answered.

## 2024-05-10 ENCOUNTER — Ambulatory Visit: Payer: Self-pay

## 2024-05-10 NOTE — Telephone Encounter (Signed)
 FYI Only or Action Required?: Action required by provider: referral to evaluate for peripheral neuropathy.  Patient was last seen in primary care on 02/02/2024 by Sagardia, Miguel Jose, MD.- Last Ortho 05/08/24 [place], PA-C- last injection 12/01/23  Called Nurse Triage reporting Leg Pain.  Symptoms began several weeks ago.  Interventions attempted: OTC medications: Tylenol .  Symptoms are: gradually worsening.  Triage Disposition: See PCP Within 2 Weeks  Patient/caregiver understands and will follow disposition?: Yes   Copied from CRM #[id number]. Topic: Clinical - Red Word Triage >> May 10, 2024 12:16 PM Dedra B wrote: Red Word that prompted transfer to Nurse Triage: Pt having pain in her legs and numbness in feet. It's always worse at night. Warm transfer to nurse triage. Reason for Disposition  [1] MILD pain (e.g., does not interfere with normal activities) AND [2] present > 7 days  Answer Assessment - Initial Assessment Questions 1. ONSET: When did the pain start?      About 3 weeks ago 2. LOCATION: Where is the pain located?      Bilateral leg and foot burning  3. PAIN: How bad is the pain?    (Scale 1-10; or mild, moderate, severe)     7-8/10- worse at night- and when sitting still to long.  4. WORK OR EXERCISE: Has there been any recent work or exercise that involved this part of the body?      No recent activity to aggravate area 5. CAUSE: What do you think is causing the leg pain?     Family hx of diabetes-concerned for peripheral neuropathy. Had bilateral knee injection (x4) last injection in August.  6. OTHER SYMPTOMS: Do you have any other symptoms? (e.g., chest pain, back pain, breathing difficulty, swelling, rash, fever, numbness, weakness)     Top of left foot feels slightly more numb then the right, no other sensation concerns.  Protocols used: Leg Pain-A-AH

## 2024-05-11 ENCOUNTER — Encounter: Payer: Self-pay | Admitting: Emergency Medicine

## 2024-05-11 ENCOUNTER — Ambulatory Visit (INDEPENDENT_AMBULATORY_CARE_PROVIDER_SITE_OTHER): Admitting: Emergency Medicine

## 2024-05-11 VITALS — BP 130/80 | HR 81 | Temp 98.1°F | Ht 64.0 in | Wt 200.0 lb

## 2024-05-11 DIAGNOSIS — Z6841 Body Mass Index (BMI) 40.0 and over, adult: Secondary | ICD-10-CM | POA: Diagnosis not present

## 2024-05-11 DIAGNOSIS — N1419 Nephropathy induced by other drugs, medicaments and biological substances: Secondary | ICD-10-CM | POA: Diagnosis not present

## 2024-05-11 DIAGNOSIS — I10 Essential (primary) hypertension: Secondary | ICD-10-CM

## 2024-05-11 DIAGNOSIS — R202 Paresthesia of skin: Secondary | ICD-10-CM | POA: Diagnosis not present

## 2024-05-11 DIAGNOSIS — T39395A Adverse effect of other nonsteroidal anti-inflammatory drugs [NSAID], initial encounter: Secondary | ICD-10-CM

## 2024-05-11 DIAGNOSIS — Z23 Encounter for immunization: Secondary | ICD-10-CM | POA: Diagnosis not present

## 2024-05-11 MED ORDER — GABAPENTIN 100 MG PO CAPS: 100.0000 mg | ORAL_CAPSULE | Freq: Every day | ORAL | 3 refills | Status: DC

## 2024-05-11 NOTE — Progress Notes (Signed)
 Tammy Mccullough 83 y.o.   Chief Complaint  Patient presents with   Leg Pain    Pt is experiencing burning in the leg and foot possible neuropathy in both legs     HISTORY OF PRESENT ILLNESS: This is a 83 y.o. female complaining of bilateral lower leg burning with occasional numbness and tingling for the past couple of weeks Concerned about possible neuropathy No other complaints or medical concerns today.  Leg Pain      Prior to Admission medications   Medication Sig Start Date End Date Taking? Authorizing Provider  acetaminophen  (TYLENOL ) 500 MG tablet Take 500 mg by mouth every 8 (eight) hours as needed.   Yes [provider]  amLODipine  (NORVASC ) 5 MG tablet TAKE 1 TABLET(5 MG) BY MOUTH DAILY 07/12/23  Yes Terrina Docter, Emil Schanz, MD  diclofenac  Sodium (VOLTAREN ) 1 % GEL Apply 2 g topically 4 (four) times daily. 08/28/20  Yes Levora Reyes JONELLE, MD  IYUZEH 0.005 % SOLN SMARTSIG:1 Drop(s) In Eye(s) Every Evening 07/12/23  Yes [provider]  olmesartan -hydrochlorothiazide  (BENICAR  HCT) 40-25 MG tablet TAKE 1 TABLET BY MOUTH DAILY 08/05/23  Yes Taahir Grisby, Emil Schanz, MD  Polyethyl Glycol-Propyl Glycol (SYSTANE HYDRATION PF OP) Apply to eye. Dry eyes   Yes [provider]  Povidone (IVIZIA DRY EYES OP) Apply 1 drop to eye as needed.   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   Cataract    Glaucoma    Glucose intolerance (impaired glucose tolerance)    Hypertension    Renal insufficiency     Past Surgical History:  Procedure Laterality Date   CATARACT EXTRACTION Right 07-2014   CATARACT EXTRACTION Left 01/16/2016   COLONOSCOPY     10 +yrs ago in [place], normal per pt   VAGINAL DELIVERY     x2    Social History   Socioeconomic History   Marital status: Widowed    Spouse name: Not on file   Number of children: 2   Years of education: Not on file   Highest education level: Not on file  Occupational History   Occupation: retired  Tobacco Use   Smoking status: Never   Smokeless tobacco: Never  Vaping Use   Vaping status: Never Used  Substance and Sexual Activity   Alcohol use: Not Currently    Alcohol/week: 6.0 standard drinks of alcohol    Types: 6 Standard drinks or equivalent per week    Comment: 2-3   Drug use: No   Sexual activity: Not Currently  Other Topics Concern   Not on file  Social History Narrative   Marital status: widowed since 2010 after 15 years of marriage.  Not dating but would like to be.  Children:  2 Children; no grandchildren; 2 adopted grandchildren.  One son is disabled; patient supports; son is obese.      Living: alone with 1 cat.      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      Tobacco: never      Alcohol: weekends liquor; some during the week.  3-5 cups per week.      Drugs: none      Exercise: water aerobics three days per week.  [employer].      Seatbelt: 100%;      Guns: none      ADLs:: independent with ADLs.   Drives. No assistant devices.      Advanced Directives: yes; healthcare POA:  Older brother Davia Ramseur);  FULL CODE but no prolonged  measures.     Social Drivers of Corporate investment banker Strain: Low Risk  (09/10/2023)   Overall Financial Resource Strain (CARDIA)    Difficulty of Paying Living Expenses: Not hard at all  Food Insecurity: No Food Insecurity (09/10/2023)   Hunger Vital Sign    Worried About Running Out of Food in the Last Year: Never true    Ran Out of Food in the Last Year: Never true  Transportation Needs: No Transportation Needs (09/10/2023)   PRAPARE - Administrator, Civil Service (Medical): No    Lack of Transportation (Non-Medical): No  Physical Activity: Insufficiently Active (09/10/2023)   Exercise Vital Sign    Days of Exercise per Week: 7 days    Minutes of Exercise per Session: 10 min  Stress: No Stress Concern Present (09/10/2023)   [employer] of Occupational Health - Occupational Stress Questionnaire    Feeling of Stress : Not at all  Social Connections: Socially Isolated (09/10/2023)   Social Connection and Isolation Panel    Frequency of Communication with Friends and Family: More than three times a week    Frequency of Social Gatherings with Friends and Family: More than three times a week    Attends Religious Services: Never    Database administrator or Organizations: No    Attends Banker Meetings: Never    Marital Status: Widowed  Intimate Partner Violence: Not At Risk (09/10/2023)   Humiliation, Afraid, Rape, and Kick questionnaire    Fear of Current or Ex-Partner: No    Emotionally Abused: No    Physically Abused: No    Sexually Abused: No    Family History  Problem Relation Age of Onset   Diabetes Mother    Heart disease Mother        unsure   COPD Father    Diabetes Sister    Hypertension Sister    Kidney disease Sister    Diabetes Brother    Diabetes Brother    Colon cancer Neg Hx    Rectal cancer Neg Hx    Stomach cancer Neg Hx      Review of Systems  Constitutional: Negative.  Negative for chills and fever.  HENT:  Negative.  Negative for congestion and sore throat.   Respiratory: Negative.  Negative for cough and shortness of breath.   Cardiovascular: Negative.  Negative for chest pain and palpitations.  Gastrointestinal:  Negative for abdominal pain, diarrhea, nausea and vomiting.  Genitourinary: Negative.  Negative for dysuria and hematuria.  Skin: Negative.  Negative for rash.  Neurological: Negative.  Negative for dizziness and headaches.  All  other systems reviewed and are negative.   Vitals:   05/11/24 0917  BP: 130/80  Pulse: 81  Temp: 98.1 F (36.7 C)  SpO2: 98%    Physical Exam Vitals reviewed.  Constitutional:      Appearance: Normal appearance. She is obese.  HENT:     Head: Normocephalic.     Mouth/Throat:     Mouth: Mucous membranes are moist.     Pharynx: Oropharynx is clear.  Eyes:     Extraocular Movements: Extraocular movements intact.  Cardiovascular:     Rate and Rhythm: Normal rate and regular rhythm.     Pulses: Normal pulses.     Heart sounds: Normal heart sounds.  Pulmonary:     Effort: Pulmonary effort is normal.     Breath sounds: Normal breath sounds.  Musculoskeletal:     Cervical back: No tenderness.     Right lower leg: Edema present.     Left lower leg: Edema present.     Comments: Feet: Warm to touch.  No erythema.  Mild edema.  Good distal peripheral pulses and good capillary refill.  Pretty good sensation  Lymphadenopathy:     Cervical: No cervical adenopathy.  Skin:    General: Skin is warm and dry.     Capillary Refill: Capillary refill takes less than 2 seconds.  Neurological:     General: No focal deficit present.     Mental Status: She is alert and oriented to person, place, and time.  Psychiatric:        Mood and Affect: Mood normal.        Behavior: Behavior normal.      ASSESSMENT & PLAN: Problem List Items Addressed This Visit       Cardiovascular and Mediastinum   HTN (hypertension)   Well-controlled  hypertension. 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 Advised to decrease amount of daily carbohydrate intake and daily calories and increase amount of plant-based protein in her diet      Paresthesia of bilateral legs - Primary   Possible neuropathy Multiple chronic medical conditions Multiple medications Recommend trial of gabapentin  100 mg at bedtime for the next couple weeks      Relevant Medications   gabapentin  (NEURONTIN ) 100 MG capsule   Other Visit Diagnoses       Flu vaccine need       Relevant Orders   Flu vaccine HIGH DOSE PF(Fluzone Trivalent) (Completed)      Patient Instructions  Nerve Damage (Peripheral Neuropathy): What to Know Peripheral neuropathy happens when there's damage to the peripheral nerves. These nerves send signals between your spinal cord and your arms and legs. You can have damage in one or more nerves. What are the causes? There are many reasons why nerves can get damaged. Damage may be caused by some diseases, such as: Diabetes. This is the most common cause. Autoimmune diseases, such as rheumatoid arthritis or lupus. These happen when your body's defense system (immune system) attacks healthy parts of your body by mistake. Inherited nerve disease. This  is passed down in families. Kidney disease. Thyroid disease. Other causes include: Pressure or stress on a nerve that lasts a long time. Lack of B vitamins from poor diet or drinking too much alcohol. Infections. Some medicines, like chemotherapy used for cancer  treatment. Poisonous (toxic) substances, such as lead or mercury. Too little blood flowing to the legs. Sometimes, the cause isn't known. What are the signs or symptoms? Symptoms depend on which nerves are damaged. In your legs, hands, or arms, you may have: Loss of feeling (numbness). Tingling. Burning pain. Very sensitive skin. Weakness. Paralysis. This is when you can't move a part of your body. Clumsiness. Muscle twitching. Loss of balance. You may have trouble walking. Other symptoms can include: Not being able to control when you pee. Feeling dizzy. Problems during sex. How is this diagnosed? Your health care provider will: Ask about your symptoms and medical history. Do a physical exam. Do a neurological exam. They will check your reflexes, how you move, and what you can feel. You may need to have other tests, such as: Blood tests. Nerve tests to check how well your nerves and muscles work. Imaging tests, such as a CT scan or MRI. These are done to rule out other causes. Nerve biopsy. This is when a small piece of a nerve is removed for testing. Lumbar puncture. A small amount of the fluid that surrounds the brain and spinal cord is taken and checked in a lab. How is this treated? Treatment may include: Treating the cause of the nerve damage. Pain medicines. Other medicines that may help with pain, such as: Medicines that treat seizures. Medicines that treat depression. Pain patches or creams that are put on painful areas of skin. TENS therapy. This uses a device that sends mild electrical pulses to your nerves. This will prevent pain signals from reaching the brain. Massage. Acupuncture. Surgery to relieve pressure on a nerve or to destroy a nerve that's causing pain. This is done only if the other treatments are not helping. You may also need physical therapy to help improve your movement, balance, and muscle strength. You may be told to use a cane or walker if  needed. Follow these instructions at home: Medicines Take your medicines only as told. You may need to take steps to help treat or prevent trouble pooping (constipation), such as: Taking medicines to help you poop. Eating foods high in fiber, like beans, whole grains, and fresh fruits and vegetables. Drinking more fluids as told. Ask your provider if it's safe to drive or use machines while taking your medicine. Lifestyle  Do not smoke, vape, or use nicotine or tobacco. Avoid or limit alcohol. Too much alcohol can be harmful to nerves and cause damage. Eat a healthy diet. Safety  Take care to avoid burning or damaging your skin if you have numbness. If you have numbness in your feet: Check your feet each day for signs of injury or infection. Watch for redness, warmth, and swelling. Wear padded socks and comfortable shoes. These help protect your feet. General instructions If you have diabetes, keep your blood sugar under control. Develop a good support system. Living with nerve damage can cause a lot of stress. Talk with a mental health therapist or join a support group. Exercise as told. Ask what things are safe for you to do at home. Work with a pain specialist to find the best way to manage your pain. Keep all follow-up visits. Your provider will check if the treatments  are working and change them if needed. Where to find support The Foundation for Peripheral Neuropathy: [employer].si Where to find more information To learn more, go to: [employer] of Neurological Disorders and Stroke at BasicFM.no. Click Search and type peripheral neuropathy. Find the link you need. Contact a health care provider if: Your pain treatments are not working. Your symptoms are getting worse. You have side effects from any of your medicines. You have new symptoms. You're having a hard time coping with your condition. Get help right away if: You have a wound  that's not healing. You have new weakness in an arm or leg. You've fallen or you fall often. This information is not intended to replace advice given to you by your health care provider. Make sure you discuss any questions you have with your health care provider. Document Revised: 10/28/2023 Document Reviewed: 10/28/2023 Elsevier Patient Education  2025 Elsevier Inc.     Emil Schaumann, MD [facility]

## 2024-05-11 NOTE — Assessment & Plan Note (Signed)
 Possible neuropathy Multiple chronic medical conditions Multiple medications Recommend trial of gabapentin  100 mg at bedtime for the next couple weeks

## 2024-05-11 NOTE — Assessment & Plan Note (Signed)
 Diet and nutrition discussed.  Advised to decrease amount of daily carbohydrate intake and daily calories and increase amount of plant-based protein in her diet.

## 2024-05-11 NOTE — Assessment & Plan Note (Signed)
Well-controlled hypertension. Continue Benicar HCT 40-25 mg daily on amlodipine 5 mg daily Dietary approaches to stop hypertension discussed

## 2024-05-11 NOTE — Patient Instructions (Signed)
 Nerve Damage (Peripheral Neuropathy): What to Know Peripheral neuropathy happens when there's damage to the peripheral nerves. These nerves send signals between your spinal cord and your arms and legs. You can have damage in one or more nerves. What are the causes? There are many reasons why nerves can get damaged. Damage may be caused by some diseases, such as: Diabetes. This is the most common cause. Autoimmune diseases, such as rheumatoid arthritis or lupus. These happen when your body's defense system (immune system) attacks healthy parts of your body by mistake. Inherited nerve disease. This is passed down in families. Kidney disease. Thyroid disease. Other causes include: Pressure or stress on a nerve that lasts a long time. Lack of B vitamins from poor diet or drinking too much alcohol. Infections. Some medicines, like chemotherapy used for cancer treatment. Poisonous (toxic) substances, such as lead or mercury. Too little blood flowing to the legs. Sometimes, the cause isn't known. What are the signs or symptoms? Symptoms depend on which nerves are damaged. In your legs, hands, or arms, you may have: Loss of feeling (numbness). Tingling. Burning pain. Very sensitive skin. Weakness. Paralysis. This is when you can't move a part of your body. Clumsiness. Muscle twitching. Loss of balance. You may have trouble walking. Other symptoms can include: Not being able to control when you pee. Feeling dizzy. Problems during sex. How is this diagnosed? Your health care provider will: Ask about your symptoms and medical history. Do a physical exam. Do a neurological exam. They will check your reflexes, how you move, and what you can feel. You may need to have other tests, such as: Blood tests. Nerve tests to check how well your nerves and muscles work. Imaging tests, such as a CT scan or MRI. These are done to rule out other causes. Nerve biopsy. This is when a small piece of a  nerve is removed for testing. Lumbar puncture. A small amount of the fluid that surrounds the brain and spinal cord is taken and checked in a lab. How is this treated? Treatment may include: Treating the cause of the nerve damage. Pain medicines. Other medicines that may help with pain, such as: Medicines that treat seizures. Medicines that treat depression. Pain patches or creams that are put on painful areas of skin. TENS therapy. This uses a device that sends mild electrical pulses to your nerves. This will prevent pain signals from reaching the brain. Massage. Acupuncture. Surgery to relieve pressure on a nerve or to destroy a nerve that's causing pain. This is done only if the other treatments are not helping. You may also need physical therapy to help improve your movement, balance, and muscle strength. You may be told to use a cane or walker if needed. Follow these instructions at home: Medicines Take your medicines only as told. You may need to take steps to help treat or prevent trouble pooping (constipation), such as: Taking medicines to help you poop. Eating foods high in fiber, like beans, whole grains, and fresh fruits and vegetables. Drinking more fluids as told. Ask your provider if it's safe to drive or use machines while taking your medicine. Lifestyle  Do not smoke, vape, or use nicotine or tobacco. Avoid or limit alcohol. Too much alcohol can be harmful to nerves and cause damage. Eat a healthy diet. Safety  Take care to avoid burning or damaging your skin if you have numbness. If you have numbness in your feet: Check your feet each day for signs of injury  or infection. Watch for redness, warmth, and swelling. Wear padded socks and comfortable shoes. These help protect your feet. General instructions If you have diabetes, keep your blood sugar under control. Develop a good support system. Living with nerve damage can cause a lot of stress. Talk with a mental  health therapist or join a support group. Exercise as told. Ask what things are safe for you to do at home. Work with a pain specialist to find the best way to manage your pain. Keep all follow-up visits. Your provider will check if the treatments are working and change them if needed. Where to find support The Foundation for Peripheral Neuropathy: [employer]Maniac.si Where to find more information To learn more, go to: [employer] of Neurological Disorders and Stroke at BasicFM.no. Click Search and type peripheral neuropathy. Find the link you need. Contact a health care provider if: Your pain treatments are not working. Your symptoms are getting worse. You have side effects from any of your medicines. You have new symptoms. You're having a hard time coping with your condition. Get help right away if: You have a wound that's not healing. You have new weakness in an arm or leg. You've fallen or you fall often. This information is not intended to replace advice given to you by your health care provider. Make sure you discuss any questions you have with your health care provider. Document Revised: 10/28/2023 Document Reviewed: 10/28/2023 Elsevier Patient Education  2025 [employer].

## 2024-05-11 NOTE — Assessment & Plan Note (Signed)
Chronic kidney disease stage IIIb Stable chronic condition Advised to stay well-hydrated and avoid NSAIDs Follows up with nephrologist on a regular basis

## 2024-05-18 ENCOUNTER — Ambulatory Visit: Admission: EM | Admit: 2024-05-18 | Discharge: 2024-05-18 | Disposition: A | Discharge status: Home / Self Care

## 2024-05-18 ENCOUNTER — Ambulatory Visit (INDEPENDENT_AMBULATORY_CARE_PROVIDER_SITE_OTHER)

## 2024-05-18 DIAGNOSIS — S93602A Unspecified sprain of left foot, initial encounter: Secondary | ICD-10-CM | POA: Diagnosis not present

## 2024-05-18 DIAGNOSIS — M79672 Pain in left foot: Secondary | ICD-10-CM | POA: Diagnosis not present

## 2024-05-18 DIAGNOSIS — M85872 Other specified disorders of bone density and structure, left ankle and foot: Secondary | ICD-10-CM | POA: Diagnosis not present

## 2024-05-18 DIAGNOSIS — M7989 Other specified soft tissue disorders: Secondary | ICD-10-CM | POA: Diagnosis not present

## 2024-05-18 DIAGNOSIS — M19072 Primary osteoarthritis, left ankle and foot: Secondary | ICD-10-CM | POA: Diagnosis not present

## 2024-05-18 NOTE — ED Provider Notes (Signed)
 [facility]  Note:  This document was prepared using Dragon voice recognition software and may include unintentional dictation errors.  MRN: [id number] DOB: 11-10-1940  Subjective:   Tammy Mccullough is a 83 y.o. female presenting for left dorsal foot pain x 1 week.  Patient reports that she was trying to reach for her grandchild who was running around when she inadvertently twisted her ankle/foot foot.  Patient states that since that time she has been having pain over the top of her foot.  Patient denies any severe swelling, erythema, warmth to the area.  Patient denies any previous injury or trauma.  Patient has been using over-the-counter Voltaren  gel with some relief but states that symptoms have not improved.  No current facility-administered medications for this encounter.  Current Outpatient Medications:    EYSUVIS 0.25 % SUSP, Apply 1 drop to eye 4 (four) times daily., Disp: , Rfl:    neomycin -polymyxin-dexamethasone (MAXITROL) 0.1 % ophthalmic suspension, Apply 1 drop to eye 2 (two) times daily., Disp: , Rfl:    acetaminophen  (TYLENOL ) 500 MG tablet, Take 500 mg by mouth every 8 (eight) hours as needed., Disp: , Rfl:    amLODipine  (NORVASC ) 5 MG tablet, TAKE 1 TABLET(5 MG) BY MOUTH DAILY, Disp: 90 tablet, Rfl: 3   diclofenac  Sodium (VOLTAREN ) 1 % GEL, Apply 2 g topically 4 (four) times daily., Disp: 100 g, Rfl: 3   gabapentin  (NEURONTIN ) 100 MG capsule, Take 1 capsule (100 mg total) by mouth at bedtime., Disp: 30 capsule, Rfl: 3   IYUZEH 0.005 % SOLN, SMARTSIG:1 Drop(s) In Eye(s) Every Evening, Disp: , Rfl:    olmesartan -hydrochlorothiazide  (BENICAR  HCT) 40-25 MG tablet, TAKE 1 TABLET BY MOUTH DAILY, Disp: 90 tablet, Rfl: 3   Polyethyl Glycol-Propyl Glycol (SYSTANE HYDRATION PF OP), Apply to eye. Dry eyes, Disp: , Rfl:    Povidone (IVIZIA DRY EYES OP), Apply 1 drop to eye as needed., Disp: , Rfl:    ZIOPTAN 0.0015 % SOLN, , Disp: , Rfl:    No Known  Allergies  Past Medical History:  Diagnosis Date   Arthritis    B knees.   Cataract    Glaucoma    Glucose intolerance (impaired glucose tolerance)    Hypertension    Renal insufficiency      Past Surgical History:  Procedure Laterality Date   CATARACT EXTRACTION Right 07-2014   CATARACT EXTRACTION Left 01/16/2016   COLONOSCOPY     10 +yrs ago in [place], normal per pt   VAGINAL DELIVERY     x2    Family History  Problem Relation Age of Onset   Diabetes Mother    Heart disease Mother        unsure   COPD Father    Diabetes Sister    Hypertension Sister    Kidney disease Sister    Diabetes Brother    Diabetes Brother    Colon cancer Neg Hx    Rectal cancer Neg Hx    Stomach cancer Neg Hx     Social History   Tobacco Use   Smoking status: Never   Smokeless tobacco: Never  Vaping Use   Vaping status: Never Used  Substance Use Topics   Alcohol use: Not Currently    Alcohol/week: 6.0 standard drinks of alcohol    Types: 6 Standard drinks or equivalent per week    Comment: 2-3   Drug use: No    ROS Refer to HPI for ROS details.  Objective:  Vitals: BP (!) 146/78 (BP Location: Left Arm)   Pulse 83   Temp 98.2 F (36.8 C) (Oral)   Resp 18   Ht 5' 4 (1.626 m)   Wt 199 lb 15.3 oz (90.7 kg)   SpO2 97%   BMI 34.32 kg/m   Physical Exam Vitals and nursing note reviewed.  Constitutional:      General: She is not in acute distress.    Appearance: Normal appearance. She is well-developed. She is not ill-appearing or toxic-appearing.  HENT:     Head: Normocephalic and atraumatic.  Cardiovascular:     Rate and Rhythm: Normal rate.  Pulmonary:     Effort: Pulmonary effort is normal. No respiratory distress.  Musculoskeletal:     Left foot: Normal range of motion and normal capillary refill. Tenderness and bony tenderness present. No swelling, deformity or crepitus. Normal pulse.       Feet:  Skin:    General: Skin is warm and dry.  Neurological:      General: No focal deficit present.     Mental Status: She is alert and oriented to person, place, and time.  Psychiatric:        Mood and Affect: Mood normal.        Behavior: Behavior normal.     Procedures  No results found for this or any previous visit (from the past 24 hours).  DG Foot Complete Left Result Date: 05/18/2024 CLINICAL DATA:  Pain. Swelling. Decreased ROM. Injury. EXAM: LEFT FOOT - COMPLETE 3+ VIEW COMPARISON:  February 11, 2015 FINDINGS: Diffuse osteopenia.No acute fracture or dislocation. Advanced joint space loss with bony sclerosis throughout the tarsal bones and the tarsometatarsal joints. Soft tissues are unremarkable. IMPRESSION: 1. Diffuse osteopenia.  No acute fracture or dislocation. 2. 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 Electronically Signed   By: Rogelia Myers M.D.   On: 05/18/2024 08:37     Assessment and Plan :     Discharge Instructions       1. Foot sprain, left, initial encounter (Primary) - DG Foot Complete Left x-ray completed in UC shows no acute fracture or dislocation, mild osteopenia, mild osteoarthritis. - Continue using over-the-counter Voltaren  gel or ibuprofen for pain and inflammation to the left foot -Continue to monitor symptoms for any change in severity if there is any escalation of current symptoms or development of new symptoms follow-up in ER for further evaluation and management.      Kooper Chriswell B Ninamarie Keel   [place] Latouche, [place], [place] 05/18/24 [phone]

## 2024-05-18 NOTE — Discharge Instructions (Signed)
  1. Foot sprain, left, initial encounter (Primary) - DG Foot Complete Left x-ray completed in UC shows no acute fracture or dislocation, mild osteopenia, mild osteoarthritis. - Continue using over-the-counter Voltaren  gel or ibuprofen for pain and inflammation to the left foot -Continue to monitor symptoms for any change in severity if there is any escalation of current symptoms or development of new symptoms follow-up in ER for further evaluation and management.

## 2024-05-18 NOTE — ED Triage Notes (Signed)
 Patient reports that approximately a week ago, while trying to manage their grandchild, they inadvertently twisted their left foot. Since then, they have experienced pain in the mid to upper portion of the foot, radiating toward the toes. There is no associated ankle or heel pain. The patient denies any falls or other injuries at the time. Initially used Voltaren  gel with some relief, but it is no longer effective.

## 2024-06-05 ENCOUNTER — Ambulatory Visit (INDEPENDENT_AMBULATORY_CARE_PROVIDER_SITE_OTHER): Admitting: Podiatry

## 2024-06-05 ENCOUNTER — Encounter: Payer: Self-pay | Admitting: Podiatry

## 2024-06-05 VITALS — Ht 64.0 in | Wt 200.0 lb

## 2024-06-05 DIAGNOSIS — L600 Ingrowing nail: Secondary | ICD-10-CM

## 2024-06-05 NOTE — Patient Instructions (Signed)

## 2024-06-05 NOTE — Progress Notes (Signed)
   Chief Complaint  Patient presents with   Nail Problem    Pt is here due to left great toenails, states the nail is growing into the skin, she thinks nail needs to come off again.    Subjective: Patient presents today for evaluation of pain to the lateral border of the left great toe. Patient is concerned for possible ingrown nail.  It is very sensitive to touch.  She also does not like the coloration of the nail plate.  She would like to have the entire nail avulsed and allow the new nail to grow in although I do believe that it would likely grow back dystrophic and ingrown again.  Despite this she wants to have the nail avulsed in its entirety.    Past Medical History:  Diagnosis Date   Arthritis    B knees.   Cataract    Glaucoma    Glucose intolerance (impaired glucose tolerance)    Hypertension    Renal insufficiency     Past Surgical History:  Procedure Laterality Date   CATARACT EXTRACTION Right 07-2014   CATARACT EXTRACTION Left 01/16/2016   COLONOSCOPY     10 +yrs ago in [place], normal per pt   VAGINAL DELIVERY     x2    No Known Allergies  Objective:  General: Well developed, nourished, in no acute distress, alert and oriented x3   Dermatology: Skin is warm, dry and supple bilateral.  Left great toe is tender with evidence of an ingrowing nail. Pain on palpation noted to the border of the nail fold. The remaining nails appear unremarkable at this time.   Vascular: DP and PT pulses palpable.  No clinical evidence of vascular compromise  Neruologic: Grossly intact via light touch bilateral.  Musculoskeletal: No pedal deformity noted  Assesement: #1 Paronychia with ingrowing nail left great toe  Plan of Care:  -Patient evaluated.  -Discussed treatment alternatives and plan of care. Explained nail avulsion procedure and post procedure course to patient. - The patient would like total temporary nail avulsion.  She would like to have the nail avulsed and allow new  nail to grow in.  This was discussed with the patient and the toe was prepped in aseptic manner and digital block performed using 3 mL of 2% lidocaine  plain.  The nail was avulsed in its entirety followed by dry sterile dressings.  Post care instructions provided. -Return to clinic PRN  Thresa EMERSON Sar, DPM [facility]  Dr. Thresa EMERSON Sar, DPM    [address] [place], [place] [address]                Office [phone]  Fax [phone]

## 2024-06-06 DIAGNOSIS — N183 Chronic kidney disease, stage 3 unspecified: Secondary | ICD-10-CM | POA: Diagnosis not present

## 2024-06-06 DIAGNOSIS — I129 Hypertensive chronic kidney disease with stage 1 through stage 4 chronic kidney disease, or unspecified chronic kidney disease: Secondary | ICD-10-CM | POA: Diagnosis not present

## 2024-06-07 DIAGNOSIS — T39395A Adverse effect of other nonsteroidal anti-inflammatory drugs [NSAID], initial encounter: Secondary | ICD-10-CM | POA: Diagnosis not present

## 2024-06-07 DIAGNOSIS — N183 Chronic kidney disease, stage 3 unspecified: Secondary | ICD-10-CM | POA: Diagnosis not present

## 2024-06-07 DIAGNOSIS — M898X9 Other specified disorders of bone, unspecified site: Secondary | ICD-10-CM | POA: Diagnosis not present

## 2024-06-07 DIAGNOSIS — E559 Vitamin D deficiency, unspecified: Secondary | ICD-10-CM | POA: Diagnosis not present

## 2024-06-07 DIAGNOSIS — N1419 Nephropathy induced by other drugs, medicaments and biological substances: Secondary | ICD-10-CM | POA: Diagnosis not present

## 2024-06-07 DIAGNOSIS — I129 Hypertensive chronic kidney disease with stage 1 through stage 4 chronic kidney disease, or unspecified chronic kidney disease: Secondary | ICD-10-CM | POA: Diagnosis not present

## 2024-06-07 DIAGNOSIS — D518 Other vitamin B12 deficiency anemias: Secondary | ICD-10-CM | POA: Diagnosis not present

## 2024-06-19 ENCOUNTER — Encounter: Payer: Self-pay | Admitting: Radiology

## 2024-06-26 ENCOUNTER — Ambulatory Visit: Payer: Self-pay | Admitting: *Deleted

## 2024-06-26 NOTE — Telephone Encounter (Signed)
 FYI Only or Action Required?: FYI only for provider: appointment scheduled on 06/27/24.  Patient was last seen in primary care on 05/11/2024 by Purcell Emil Schanz, MD.  Called Nurse Triage reporting Leg Swelling.  Symptoms began several days ago.  Interventions attempted: Rest, hydration, or home remedies.  Symptoms are: gradually worsening.  Triage Disposition: See Physician Within 24 Hours  Patient/caregiver understands and will follow disposition?: Yes  Reason for Disposition  [1] MODERATE leg swelling (e.g., swelling extends up to knees) AND [2] new-onset or getting worse  Answer Assessment - Initial Assessment Questions Pt reports non-pitting bilateral lower leg edema, states more severe on the right. Denies obvious redness or pain. Denies SOB. Advised to call back if pain or SOB develop.   1. ONSET: When did the swelling start? (e.g., minutes, hours, days)     A few days  2. LOCATION: What part of the leg is swollen?  Are both legs swollen or just one leg?     Bilateral, worse on the right  3. SEVERITY: How bad is the swelling? (e.g., localized; mild, moderate, severe)     Denies pitting  4. REDNESS: Is there redness or signs of infection?     Denies  5. PAIN: Is the swelling painful to touch? If Yes, ask: How painful is it?   (Scale 1-10; mild, moderate or severe)     Denies pain, feels tight  Protocols used: Leg Swelling and Edema-A-AH

## 2024-06-26 NOTE — Telephone Encounter (Signed)
 Called disconnected during transfer from [facility]. Attempted to call patient back to review sx of leg swelling # [phone] and someone answered but hung up again. Unable to leave message to call back.       Copied from CRM [id number]. Topic: Clinical - Red Word Triage >> Jun 26, 2024  9:47 AM Logan F wrote: Red Word that prompted transfer to Nurse Triage: Pt was seen on 9/25 for pain in her legs. She was given medication for it but now she has swelling in her legs and ankles. The swelling has been going on for a couple of weeks. It is the right ankle more than the left. The right leg feel swollen/tight more than the left.

## 2024-06-27 ENCOUNTER — Ambulatory Visit (INDEPENDENT_AMBULATORY_CARE_PROVIDER_SITE_OTHER): Admitting: Emergency Medicine

## 2024-06-27 ENCOUNTER — Encounter: Payer: Self-pay | Admitting: Emergency Medicine

## 2024-06-27 VITALS — BP 146/80 | HR 98 | Temp 98.2°F | Ht 64.0 in | Wt 211.0 lb

## 2024-06-27 DIAGNOSIS — I10 Essential (primary) hypertension: Secondary | ICD-10-CM | POA: Diagnosis not present

## 2024-06-27 DIAGNOSIS — R600 Localized edema: Secondary | ICD-10-CM | POA: Diagnosis not present

## 2024-06-27 MED ORDER — FUROSEMIDE 20 MG PO TABS: 20.0000 mg | ORAL_TABLET | Freq: Every day | ORAL | 3 refills | Status: DC

## 2024-06-27 NOTE — Patient Instructions (Signed)
 Peripheral Edema  Peripheral edema is swelling that is caused by a buildup of fluid. Peripheral edema most often affects the lower legs, ankles, and feet. It can also develop in the arms, hands, and face. The area of the body that has peripheral edema will look swollen. It may also feel heavy or warm. Your clothes may start to feel tight. Pressing on the area may make a temporary dent in your skin (pitting edema). You may not be able to move your swollen arm or leg as much as usual. There are many causes of peripheral edema. It can happen because of a complication of other conditions such as heart failure, kidney disease, or a problem with your circulation. It also can be a side effect of certain medicines or happen because of an infection. It often happens to women during pregnancy. Sometimes, the cause is not known. Follow these instructions at home: Managing pain, stiffness, and swelling  Raise (elevate) your legs while you are sitting or lying down. Move around often to prevent stiffness and to reduce swelling. Do not sit or stand for long periods of time. Do not wear tight clothing. Do not wear garters on your upper legs. Exercise your legs to get your circulation going. This helps to move the fluid back into your blood vessels, and it may help the swelling go down. Wear compression stockings as told by your health care provider. These stockings help to prevent blood clots and reduce swelling in your legs. It is important that these are the correct size. These stockings should be prescribed by your doctor to prevent possible injuries. If elastic bandages or wraps are recommended, use them as told by your health care provider. Medicines Take over-the-counter and prescription medicines only as told by your health care provider. Your health care provider may prescribe medicine to help your body get rid of excess water (diuretic). Take this medicine if you are told to take it. General  instructions Eat a low-salt (low-sodium) diet as told by your health care provider. Sometimes, eating less salt may reduce swelling. Pay attention to any changes in your symptoms. Moisturize your skin daily to help prevent skin from cracking and draining. Keep all follow-up visits. This is important. Contact a health care provider if: You have a fever. You have swelling in only one leg. You have increased swelling, redness, or pain in one or both of your legs. You have drainage or sores at the area where you have edema. Get help right away if: You have edema that starts suddenly or is getting worse, especially if you are pregnant or have a medical condition. You develop shortness of breath, especially when you are lying down. You have pain in your chest or abdomen. You feel weak. You feel like you will faint. These symptoms may be an emergency. Get help right away. Call 911. Do not wait to see if the symptoms will go away. Do not drive yourself to the hospital. Summary Peripheral edema is swelling that is caused by a buildup of fluid. Peripheral edema most often affects the lower legs, ankles, and feet. Move around often to prevent stiffness and to reduce swelling. Do not sit or stand for long periods of time. Pay attention to any changes in your symptoms. Contact a health care provider if you have edema that starts suddenly or is getting worse, especially if you are pregnant or have a medical condition. Get help right away if you develop shortness of breath, especially when lying down.  This information is not intended to replace advice given to you by your health care provider. Make sure you discuss any questions you have with your health care provider. Document Revised: 04/07/2021 Document Reviewed: 04/07/2021 Elsevier Patient Education  2024 [employer].

## 2024-06-27 NOTE — Assessment & Plan Note (Signed)
 Significant peripheral edema on physical exam Recommend to start Lasix 20 mg daily Continue present blood pressure medications No clinical findings of congestive heart failure

## 2024-06-27 NOTE — Progress Notes (Signed)
 Tammy Mccullough 83 y.o.   Chief Complaint  Patient presents with   Leg Swelling    Pt may be having a allergic reaction to a injection she had done August the 20th and her legs are swelling and ankles and they are itching and burning     HISTORY OF PRESENT ILLNESS: This is a 83 y.o. female complaining of edema both lower legs for the past several weeks States she had shot in her knees August 20 and legs have been swelling since.  Denies pain.  Able to ambulate. No other associated symptoms No other complaints or medical concerns today.  HPI   Prior to Admission medications   Medication Sig Start Date End Date Taking? Authorizing Provider  acetaminophen  (TYLENOL ) 500 MG tablet Take 500 mg by mouth every 8 (eight) hours as needed.   Yes [provider]  amLODipine  (NORVASC ) 5 MG tablet TAKE 1 TABLET(5 MG) BY MOUTH DAILY 07/12/23  Yes Yisroel Mullendore, Emil Schanz, MD  diclofenac  Sodium (VOLTAREN ) 1 % GEL Apply 2 g topically 4 (four) times daily. 08/28/20  Yes Levora Reyes JONELLE, MD  EYSUVIS 0.25 % SUSP Apply 1 drop to eye 4 (four) times daily. 02/29/24  Yes [provider]  furosemide (LASIX) 20 MG tablet Take 1 tablet (20 mg total) by mouth daily. 06/27/24  Yes Yaretsi Humphres, Emil Schanz, MD  gabapentin  (NEURONTIN ) 100 MG capsule Take 1 capsule (100 mg total) by mouth at bedtime. 05/11/24  Yes Hipolito Martinezlopez, Emil Schanz, MD  IYUZEH 0.005 % SOLN SMARTSIG:1 Drop(s) In Eye(s) Every Evening 07/12/23  Yes [provider]  neomycin -polymyxin-dexamethasone (MAXITROL) 0.1 % ophthalmic suspension Apply 1 drop to eye 2 (two) times daily. 04/19/24  Yes [provider]  olmesartan -hydrochlorothiazide  (BENICAR  HCT) 40-25 MG tablet TAKE 1 TABLET BY MOUTH DAILY 08/05/23  Yes Umer Harig, Emil Schanz, MD  Polyethyl Glycol-Propyl Glycol (SYSTANE HYDRATION PF OP) Apply to eye. Dry eyes   Yes [provider]  Povidone (IVIZIA DRY EYES OP) Apply 1 drop to eye as needed.   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   Cataract    Glaucoma    Glucose intolerance (impaired glucose tolerance)    Hypertension    Renal insufficiency     Past Surgical History:  Procedure Laterality Date   CATARACT EXTRACTION Right 07-2014   CATARACT EXTRACTION Left 01/16/2016   COLONOSCOPY     10 +yrs ago in , normal per pt   VAGINAL DELIVERY     x2    Social History   Socioeconomic  History   Marital status: Widowed    Spouse name: Not on file   Number of children: 2   Years of education: Not on file   Highest education level: Not on file  Occupational History   Occupation: retired  Tobacco Use   Smoking status: Never   Smokeless tobacco: Never  Vaping Use   Vaping status: Never Used  Substance and Sexual Activity   Alcohol use: Not Currently    Alcohol/week: 6.0 standard drinks of alcohol    Types: 6 Standard drinks or equivalent per week    Comment: 2-3   Drug use: No   Sexual activity: Not Currently  Other Topics Concern    Not on file  Social History Narrative   Marital status: widowed since 2010 after 15 years of marriage.  Not dating but would like to be.      Children:  2 Children; no grandchildren; 2 adopted grandchildren.  One son is disabled; patient supports; son is obese.      Living: alone with 1 cat.      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      Tobacco: never      Alcohol: weekends liquor; some during the week.  3-5 cups per week.      Drugs: none      Exercise: water aerobics three days per week.  [employer].      Seatbelt: 100%;      Guns: none      ADLs:: independent with ADLs.   Drives. No assistant devices.      Advanced Directives: yes; healthcare POA:  Older brother Davia Ramseur);  FULL CODE but no prolonged measures.     Social Drivers of Corporate Investment Banker Strain: Low Risk  (09/10/2023)   Overall Financial Resource Strain (CARDIA)    Difficulty of Paying Living Expenses: Not hard at all  Food Insecurity: No Food Insecurity (09/10/2023)   Hunger Vital Sign    Worried About Running Out of Food in the Last Year: Never true    Ran Out of Food in the Last Year: Never true  Transportation Needs: No Transportation Needs (09/10/2023)   PRAPARE - Administrator, Civil Service (Medical): No    Lack of Transportation (Non-Medical): No  Physical Activity: Insufficiently Active (09/10/2023)   Exercise Vital Sign    Days of Exercise per Week: 7 days    Minutes of Exercise per Session: 10 min  Stress: No Stress Concern Present (09/10/2023)   [employer] of Occupational Health - Occupational Stress Questionnaire    Feeling of Stress : Not at all  Social Connections: Socially Isolated (09/10/2023)   Social Connection and Isolation Panel    Frequency of Communication with Friends and Family: More than three times a week    Frequency of Social Gatherings with Friends and Family: More than three times a  week    Attends Religious Services: Never    Database Administrator or Organizations: No    Attends Banker Meetings: Never    Marital Status: Widowed  Intimate Partner Violence: Not At Risk (09/10/2023)   Humiliation, Afraid, Rape, and Kick questionnaire    Fear of Current or Ex-Partner: No    Emotionally Abused: No    Physically Abused: No    Sexually Abused: No    Family History  Problem Relation Age of Onset   Diabetes Mother  Heart disease Mother        unsure   COPD Father    Diabetes Sister    Hypertension Sister    Kidney disease Sister    Diabetes Brother    Diabetes Brother    Colon cancer Neg Hx    Rectal cancer Neg Hx    Stomach cancer Neg Hx      Review of Systems  Constitutional: Negative.  Negative for chills and fever.  HENT: Negative.  Negative for congestion and sore throat.   Respiratory:  Negative for cough and shortness of breath.   Cardiovascular:  Positive for leg swelling. Negative for chest pain and palpitations.  Gastrointestinal:  Negative for abdominal pain, nausea and vomiting.  Skin: Negative.  Negative for rash.  Neurological: Negative.  Negative for dizziness and headaches.  All other systems reviewed and are negative.   Vitals:   06/27/24 0955  BP: (!) 146/80  Pulse: 98  Temp: 98.2 F (36.8 C)  SpO2: 99%    Physical Exam Constitutional:      Appearance: Normal appearance. She is obese.  HENT:     Head: Normocephalic.  Eyes:     Extraocular Movements: Extraocular movements intact.  Cardiovascular:     Rate and Rhythm: Normal rate.  Pulmonary:     Effort: Pulmonary effort is normal.  Musculoskeletal:     Right lower leg: Edema present.     Left lower leg: Edema present.  Skin:    General: Skin is warm and dry.  Neurological:     Mental Status: She is alert and oriented to person, place, and time.  Psychiatric:        Mood and Affect: Mood normal.        Behavior: Behavior normal.      ASSESSMENT &  PLAN: Problem List Items Addressed This Visit       Cardiovascular and Mediastinum   HTN (hypertension)   BP Readings from Last 3 Encounters:  06/27/24 (!) 146/80  05/18/24 (!) 146/78  05/11/24 130/80  Elevated blood pressure reading in the office Continue Benicar  HCT 40-25 mg daily on amlodipine  5 mg daily Dietary approaches to stop hypertension discussed      Relevant Medications   furosemide (LASIX) 20 MG tablet     Other   Bilateral leg edema - Primary   Significant peripheral edema on physical exam Recommend to start Lasix 20 mg daily Continue present blood pressure medications No clinical findings of congestive heart failure       Relevant Medications   furosemide (LASIX) 20 MG tablet   Patient Instructions  Peripheral Edema  Peripheral edema is swelling that is caused by a buildup of fluid. Peripheral edema most often affects the lower legs, ankles, and feet. It can also develop in the arms, hands, and face. The area of the body that has peripheral edema will look swollen. It may also feel heavy or warm. Your clothes may start to feel tight. Pressing on the area may make a temporary dent in your skin (pitting edema). You may not be able to move your swollen arm or leg as much as usual. There are many causes of peripheral edema. It can happen because of a complication of other conditions such as heart failure, kidney disease, or a problem with your circulation. It also can be a side effect of certain medicines or happen because of an infection. It often happens to women during pregnancy. Sometimes, the cause is not known. Follow these instructions  at home: Managing pain, stiffness, and swelling  Raise (elevate) your legs while you are sitting or lying down. Move around often to prevent stiffness and to reduce swelling. Do not sit or stand for long periods of time. Do not wear tight clothing. Do not wear garters on your upper legs. Exercise your legs to get your  circulation going. This helps to move the fluid back into your blood vessels, and it may help the swelling go down. Wear compression stockings as told by your health care provider. These stockings help to prevent blood clots and reduce swelling in your legs. It is important that these are the correct size. These stockings should be prescribed by your doctor to prevent possible injuries. If elastic bandages or wraps are recommended, use them as told by your health care provider. Medicines Take over-the-counter and prescription medicines only as told by your health care provider. Your health care provider may prescribe medicine to help your body get rid of excess water (diuretic). Take this medicine if you are told to take it. General instructions Eat a low-salt (low-sodium) diet as told by your health care provider. Sometimes, eating less salt may reduce swelling. Pay attention to any changes in your symptoms. Moisturize your skin daily to help prevent skin from cracking and draining. Keep all follow-up visits. This is important. Contact a health care provider if: You have a fever. You have swelling in only one leg. You have increased swelling, redness, or pain in one or both of your legs. You have drainage or sores at the area where you have edema. Get help right away if: You have edema that starts suddenly or is getting worse, especially if you are pregnant or have a medical condition. You develop shortness of breath, especially when you are lying down. You have pain in your chest or abdomen. You feel weak. You feel like you will faint. These symptoms may be an emergency. Get help right away. Call 911. Do not wait to see if the symptoms will go away. Do not drive yourself to the hospital. Summary Peripheral edema is swelling that is caused by a buildup of fluid. Peripheral edema most often affects the lower legs, ankles, and feet. Move around often to prevent stiffness and to reduce  swelling. Do not sit or stand for long periods of time. Pay attention to any changes in your symptoms. Contact a health care provider if you have edema that starts suddenly or is getting worse, especially if you are pregnant or have a medical condition. Get help right away if you develop shortness of breath, especially when lying down. This information is not intended to replace advice given to you by your health care provider. Make sure you discuss any questions you have with your health care provider. Document Revised: 04/07/2021 Document Reviewed: 04/07/2021 Elsevier Patient Education  2024 Elsevier Inc.     Emil Schaumann, MD [facility]

## 2024-06-27 NOTE — Assessment & Plan Note (Signed)
 BP Readings from Last 3 Encounters:  06/27/24 (!) 146/80  05/18/24 (!) 146/78  05/11/24 130/80  Elevated blood pressure reading in the office Continue Benicar  HCT 40-25 mg daily on amlodipine  5 mg daily Dietary approaches to stop hypertension discussed

## 2024-06-29 ENCOUNTER — Other Ambulatory Visit: Payer: Self-pay | Admitting: Emergency Medicine

## 2024-06-29 DIAGNOSIS — I10 Essential (primary) hypertension: Secondary | ICD-10-CM

## 2024-07-27 ENCOUNTER — Telehealth: Payer: Self-pay

## 2024-07-27 ENCOUNTER — Ambulatory Visit: Payer: Self-pay

## 2024-07-27 NOTE — Telephone Encounter (Signed)
 Called patient and informed her about this on VS

## 2024-07-27 NOTE — Telephone Encounter (Signed)
 We will evaluate when she gets here

## 2024-07-27 NOTE — Telephone Encounter (Signed)
 Called patient and informed her of this on VS

## 2024-07-27 NOTE — Telephone Encounter (Signed)
 Please advise

## 2024-07-27 NOTE — Telephone Encounter (Signed)
 Copied from CRM #[id number]. Topic: Clinical - Medical Advice >> Jul 27, 2024  1:54 PM Franky GRADE wrote: Reason for CRM: Patient was seen on 06/27/2024 by Dr.Sagardia and is set to follow up with him on 08/03/2024; however, she would like to know if He wanted to do any labs prior as the swelling around her leg has not gone down and feels tight.

## 2024-07-27 NOTE — Telephone Encounter (Signed)
 FYI Only or Action Required?: Action required by provider: clinical question for provider. Pt reports she has had no improvement to edema since last OV, would like to know if PCP would like to order testing prior to follow up or see patient sooner.  Patient was last seen in primary care on 06/27/2024 by Purcell Emil Schanz, MD.  Called Nurse Triage reporting Edema.  Symptoms began Ongoing, under care of PCP, no improvement.  Interventions attempted: Prescription medications: Furosemide  20 mg.  Symptoms are: unchanged.  Triage Disposition: See PCP When Office is Open (Within 3 Days)  Patient/caregiver understands and will follow disposition?: No, wishes to speak with PCP    Reason for Triage: Patient called in stating that her Bilateral Edema has still not gone down. Patient described symptoms of: Tightness on the Ankle at night (on the other side) & Inside of the right ankle  She stated she's not sure if she should come in earlier than her listed appt next week, she's just concerned as it hasn't shown any progress.  **Wrm. Transf. To NT**   Reason for Disposition  [1] MILD swelling of both ankles (e.g., ankle joints look swollen; or bilateral mild pedal edema) AND [2] new-onset or getting worse  (Exceptions: Caused by hot weather, already seen by doctor or NP/PA for this.)  Answer Assessment - Initial Assessment Questions 1. LOCATION: Which ankle is swollen? Where is the swelling?     Bilateral, right is worse 2. ONSET: When did the swelling start?     No improvement since last OV 3. SWELLING: How bad is the swelling? Or, How large is it? (e.g., mild, moderate, severe; size of localized swelling)      Left mild, right severe 4. PAIN: Is there any pain? If Yes, ask: How bad is it? (Scale 0-10; or none, mild, moderate, severe)     Mild pain to right ankle intermittently  6. OTHER SYMPTOMS: Do you have any other symptoms? (e.g., fever, chest pain, difficulty  breathing, calf pain)     None, denies CP, SOB, pain to calf, redness  Protocols used: Ankle Swelling-A-AH

## 2024-08-01 ENCOUNTER — Ambulatory Visit: Payer: Self-pay

## 2024-08-01 NOTE — Telephone Encounter (Signed)
 FYI pt has an upcoming patient on 12/18

## 2024-08-01 NOTE — Telephone Encounter (Signed)
 FYI Only or Action Required?: FYI only for provider: appointment scheduled on 08/03/2024.  Patient was last seen in primary care on 06/27/2024 by Purcell Emil Schanz, MD.  Called Nurse Triage reporting Joint Swelling.  Symptoms began about a month ago.  Symptoms are: unchanged.  Triage Disposition: See PCP Within 2 Weeks  Patient/caregiver understands and will follow disposition?: Yes           Copied from CRM #[id number]. Topic: Clinical - Red Word Triage >> Jul 31, 2024  2:51 PM Tammy Mccullough wrote: Red Word that prompted transfer to Nurse Triage: Patient states she has spoken to pcp before about her ankle. But she says she is having new and worse symptoms including right ankle is very swollon,feels really tight up her leg, and really painful. >> Jul 31, 2024  3:12 PM Tammy Mccullough wrote: Patient disconnected while holding. Extreme swelling and tightness in ankle and pain. No answer when tried call back.  Reason for Disposition  [1] MILD swelling of one ankle AND [2] is a chronic symptom (recurrent or ongoing AND present > 4 weeks)  Answer Assessment - Initial Assessment Questions Pt states she received a message yesterday that PCP would evaluate her on Thurs, 12/18, at her appt already scheduled Pt was seen in office on 11/11 with the same symptoms- pt states it is about the same Swelling in R ankle x1 month Pain level 1/10  Denies difficulty breathing, CP Can walk on it normally Denies hx of DVT  Cause: shot (cortisone) given in knee which is when this started (end of Aug)  Protocols used: Ankle Swelling-A-AH

## 2024-08-03 ENCOUNTER — Ambulatory Visit: Admitting: Emergency Medicine

## 2024-08-03 ENCOUNTER — Encounter: Payer: Self-pay | Admitting: Emergency Medicine

## 2024-08-03 VITALS — BP 122/80 | HR 85 | Temp 97.5°F | Ht 64.0 in | Wt 211.0 lb

## 2024-08-03 DIAGNOSIS — Z6841 Body Mass Index (BMI) 40.0 and over, adult: Secondary | ICD-10-CM | POA: Diagnosis not present

## 2024-08-03 DIAGNOSIS — Z8739 Personal history of other diseases of the musculoskeletal system and connective tissue: Secondary | ICD-10-CM

## 2024-08-03 DIAGNOSIS — I10 Essential (primary) hypertension: Secondary | ICD-10-CM | POA: Diagnosis not present

## 2024-08-03 DIAGNOSIS — N1832 Chronic kidney disease, stage 3b: Secondary | ICD-10-CM | POA: Insufficient documentation

## 2024-08-03 DIAGNOSIS — R600 Localized edema: Secondary | ICD-10-CM | POA: Diagnosis not present

## 2024-08-03 MED ORDER — FUROSEMIDE 20 MG PO TABS: 40.0000 mg | ORAL_TABLET | Freq: Every day | ORAL | 3 refills | Status: AC

## 2024-08-03 NOTE — Assessment & Plan Note (Signed)
 As per most recent nephrologist office visit notes:  Plan:  -Urinalysis negative for blood, protein. -No significant risk factor for CKD progression. -Phos, bicarb, PTH, vitamin D , hemoglobin at goal for CKD. -Blood pressure well-controlled -Continue to avoid chronic NSAID use. -Return to clinic in 1 year or PRN.

## 2024-08-03 NOTE — Assessment & Plan Note (Signed)
 Stable.  Takes Tylenol as needed.  Advised to avoid NSAIDs.

## 2024-08-03 NOTE — Assessment & Plan Note (Signed)
 BP Readings from Last 3 Encounters:  08/03/24 122/80  06/27/24 (!) 146/80  05/18/24 (!) 146/78  Well-controlled hypertension Continue Benicar  HCT 40-25 mg daily and amlodipine  5 mg daily Dietary approaches to stop hypertension discussed

## 2024-08-03 NOTE — Patient Instructions (Signed)
 Increase Lasix  to 40 mg daily Continue all other medications  Peripheral Edema  Peripheral edema is swelling that is caused by a buildup of fluid. Peripheral edema most often affects the lower legs, ankles, and feet. It can also develop in the arms, hands, and face. The area of the body that has peripheral edema will look swollen. It may also feel heavy or warm. Your clothes may start to feel tight. Pressing on the area may make a temporary dent in your skin (pitting edema). You may not be able to move your swollen arm or leg as much as usual. There are many causes of peripheral edema. It can happen because of a complication of other conditions such as heart failure, kidney disease, or a problem with your circulation. It also can be a side effect of certain medicines or happen because of an infection. It often happens to women during pregnancy. Sometimes, the cause is not known. Follow these instructions at home: Managing pain, stiffness, and swelling  Raise (elevate) your legs while you are sitting or lying down. Move around often to prevent stiffness and to reduce swelling. Do not sit or stand for long periods of time. Do not wear tight clothing. Do not wear garters on your upper legs. Exercise your legs to get your circulation going. This helps to move the fluid back into your blood vessels, and it may help the swelling go down. Wear compression stockings as told by your health care provider. These stockings help to prevent blood clots and reduce swelling in your legs. It is important that these are the correct size. These stockings should be prescribed by your doctor to prevent possible injuries. If elastic bandages or wraps are recommended, use them as told by your health care provider. Medicines Take over-the-counter and prescription medicines only as told by your health care provider. Your health care provider may prescribe medicine to help your body get rid of excess water (diuretic). Take  this medicine if you are told to take it. General instructions Eat a low-salt (low-sodium) diet as told by your health care provider. Sometimes, eating less salt may reduce swelling. Pay attention to any changes in your symptoms. Moisturize your skin daily to help prevent skin from cracking and draining. Keep all follow-up visits. This is important. Contact a health care provider if: You have a fever. You have swelling in only one leg. You have increased swelling, redness, or pain in one or both of your legs. You have drainage or sores at the area where you have edema. Get help right away if: You have edema that starts suddenly or is getting worse, especially if you are pregnant or have a medical condition. You develop shortness of breath, especially when you are lying down. You have pain in your chest or abdomen. You feel weak. You feel like you will faint. These symptoms may be an emergency. Get help right away. Call 911. Do not wait to see if the symptoms will go away. Do not drive yourself to the hospital. Summary Peripheral edema is swelling that is caused by a buildup of fluid. Peripheral edema most often affects the lower legs, ankles, and feet. Move around often to prevent stiffness and to reduce swelling. Do not sit or stand for long periods of time. Pay attention to any changes in your symptoms. Contact a health care provider if you have edema that starts suddenly or is getting worse, especially if you are pregnant or have a medical condition. Get help right  away if you develop shortness of breath, especially when lying down. This information is not intended to replace advice given to you by your health care provider. Make sure you discuss any questions you have with your health care provider. Document Revised: 04/07/2021 Document Reviewed: 04/07/2021 Elsevier Patient Education  2024 [employer].

## 2024-08-03 NOTE — Assessment & Plan Note (Signed)
 Better than before Encouraged to elevate legs when resting at home Increase Lasix  to 40 mg daily Low-salt diet

## 2024-08-03 NOTE — Progress Notes (Signed)
 Tammy Mccullough 83 y.o.   Chief Complaint  Patient presents with   Follow-up    Pt legs are both still swollen but she states that they have gotten better     HISTORY OF PRESENT ILLNESS: This is a 83 y.o. female here for 52-month follow-up of multiple chronic medical conditions Peripheral edema still there but better than before. No other complaints or medical concerns today.  HPI   Prior to Admission medications  Medication Sig Start Date End Date Taking? Authorizing Provider  acetaminophen  (TYLENOL ) 500 MG tablet Take 500 mg by mouth every 8 (eight) hours as needed.   Yes [provider]  amLODipine  (NORVASC ) 5 MG tablet TAKE 1 TABLET(5 MG) BY MOUTH DAILY 06/29/24  Yes Mando Blatz, Emil Schanz, MD  diclofenac  Sodium (VOLTAREN ) 1 % GEL Apply 2 g topically 4 (four) times daily. 08/28/20  Yes Levora Reyes JONELLE, MD  EYSUVIS 0.25 % SUSP Apply 1 drop to eye 4 (four) times daily. 02/29/24  Yes [provider]  furosemide  (LASIX ) 20 MG tablet Take 1 tablet (20 mg total) by mouth daily. 06/27/24  Yes Jaye Saal, Emil Schanz, MD  gabapentin  (NEURONTIN ) 100 MG capsule Take 1 capsule (100 mg total) by mouth at bedtime. 05/11/24  Yes Briggs Edelen, Emil Schanz, MD  IYUZEH 0.005 % SOLN SMARTSIG:1 Drop(s) In Eye(s) Every Evening 07/12/23  Yes [provider]  neomycin -polymyxin-dexamethasone (MAXITROL) 0.1 % ophthalmic suspension Apply 1 drop to eye 2 (two) times daily. 04/19/24  Yes [provider]  olmesartan -hydrochlorothiazide  (BENICAR  HCT) 40-25 MG tablet TAKE 1 TABLET BY MOUTH DAILY 08/05/23  Yes Tanda Morrissey, Emil Schanz, MD  Polyethyl Glycol-Propyl Glycol (SYSTANE HYDRATION PF OP) Apply to eye. Dry eyes   Yes [provider]  Povidone (IVIZIA DRY EYES OP) Apply 1 drop to eye as needed.   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   Cataract    Glaucoma    Glucose intolerance (impaired glucose tolerance)    Hypertension    Renal insufficiency     Past Surgical History:  Procedure Laterality Date   CATARACT EXTRACTION Right 07-2014   CATARACT EXTRACTION Left 01/16/2016   COLONOSCOPY     10 +yrs ago in [place], normal per pt   VAGINAL DELIVERY     x2    Social History   Socioeconomic History   Marital status: Widowed    Spouse name: Not on file   Number of children: 2   Years of education: Not on file  Highest education level: Not on file  Occupational History   Occupation: retired  Tobacco Use   Smoking status: Never   Smokeless tobacco: Never  Vaping Use   Vaping status: Never Used  Substance and Sexual Activity   Alcohol use: Not Currently    Alcohol/week: 6.0 standard drinks of alcohol    Types: 6 Standard drinks or equivalent per week    Comment: 2-3   Drug use: No   Sexual activity: Not Currently  Other Topics Concern   Not on file  Social History Narrative   Marital status: widowed since 2010 after 15 years of marriage.  Not  dating but would like to be.      Children:  2 Children; no grandchildren; 2 adopted grandchildren.  One son is disabled; patient supports; son is obese.      Living: alone with 1 cat.      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      Tobacco: never      Alcohol: weekends liquor; some during the week.  3-5 cups per week.      Drugs: none      Exercise: water aerobics three days per week.  [employer].      Seatbelt: 100%;      Guns: none      ADLs:: independent with ADLs.   Drives. No assistant devices.      Advanced Directives: yes; healthcare POA:  Older brother Davia Ramseur);  FULL CODE but no prolonged measures.     Social Drivers of Health   Tobacco Use: Low Risk (06/27/2024)   Patient History    Smoking Tobacco Use: Never    Smokeless Tobacco Use: Never    Passive Exposure: Not on file  Financial Resource Strain: Low Risk (09/10/2023)   Overall Financial Resource Strain (CARDIA)    Difficulty of Paying Living Expenses: Not hard at all  Food Insecurity: No Food Insecurity (09/10/2023)   Hunger Vital Sign    Worried About Running Out of Food in the Last Year: Never true    Ran Out of Food in the Last Year: Never true  Transportation Needs: No Transportation Needs (09/10/2023)   PRAPARE - Administrator, Civil Service (Medical): No    Lack of Transportation (Non-Medical): No  Physical Activity: Insufficiently Active (09/10/2023)   Exercise Vital Sign    Days of Exercise per Week: 7 days    Minutes of Exercise per Session: 10 min  Stress: No Stress Concern Present (09/10/2023)   [employer] of Occupational Health - Occupational Stress Questionnaire    Feeling of Stress : Not at all  Social Connections: Socially Isolated (09/10/2023)   Social Connection and Isolation Panel    Frequency of Communication with Friends and Family: More than three times a week    Frequency of Social Gatherings with  Friends and Family: More than three times a week    Attends Religious Services: Never    Database Administrator or Organizations: No    Attends Banker Meetings: Never    Marital Status: Widowed  Intimate Partner Violence: Not At Risk (09/10/2023)   Humiliation, Afraid, Rape, and Kick questionnaire    Fear of Current or Ex-Partner: No    Emotionally Abused: No    Physically Abused: No    Sexually Abused: No  Depression (PHQ2-9): Low Risk (02/02/2024)   Depression (PHQ2-9)    PHQ-2 Score: 0  Alcohol Screen: Low Risk (09/10/2023)   Alcohol Screen    Last Alcohol Screening Score (AUDIT): 0  Housing: Unknown (09/10/2023)   Housing Stability Vital Sign    Unable to Pay for Housing in the Last Year: No    Number of Times Moved in the Last Year: Not on file    Homeless in the Last Year: No  Utilities: Not At Risk (09/10/2023)   AHC Utilities    Threatened with loss of utilities: No  Health Literacy: Adequate Health Literacy (09/10/2023)   B1300 Health Literacy    Frequency of need for help with medical instructions: Never    Family History  Problem Relation Age of Onset   Diabetes Mother    Heart disease Mother        unsure   COPD Father    Diabetes Sister    Hypertension Sister    Kidney disease Sister    Diabetes Brother    Diabetes Brother    Colon cancer Neg Hx    Rectal cancer Neg Hx    Stomach cancer Neg Hx      Review of Systems  Constitutional: Negative.  Negative for chills and fever.  HENT: Negative.  Negative for congestion and sore throat.   Respiratory: Negative.  Negative for cough and shortness of breath.   Cardiovascular:  Positive for leg swelling. Negative for chest pain and palpitations.  Gastrointestinal:  Negative for abdominal pain, diarrhea, nausea and vomiting.  Genitourinary: Negative.  Negative for dysuria and hematuria.  Skin: Negative.  Negative for rash.  Neurological: Negative.  Negative for dizziness and headaches.  All other  systems reviewed and are negative.   Today's Vitals   08/03/24 0806  BP: 122/80  Pulse: 85  Temp: (!) 97.5 F (36.4 C)  TempSrc: Oral  SpO2: 93%  Weight: 211 lb (95.7 kg)  Height: 5' 4 (1.626 m)   Body mass index is 36.22 kg/m.   Physical Exam Vitals reviewed.  Constitutional:      Appearance: Normal appearance.  HENT:     Head: Normocephalic.     Mouth/Throat:     Mouth: Mucous membranes are moist.     Pharynx: Oropharynx is clear.  Eyes:     Extraocular Movements: Extraocular movements intact.  Neck:     Comments: No JVD noted Cardiovascular:     Rate and Rhythm: Normal rate and regular rhythm.     Pulses: Normal pulses.     Heart sounds: Normal heart sounds.     Comments: No gallop noted Pulmonary:     Effort: Pulmonary effort is normal.     Breath sounds: Rales (Bibasilar crackles) present.  Abdominal:     Palpations: Abdomen is soft.     Tenderness: There is no abdominal tenderness.  Musculoskeletal:     Cervical back: No tenderness.     Right lower leg: Edema present.     Left lower leg: Edema present.  Lymphadenopathy:     Cervical: No cervical adenopathy.  Neurological:     General: No focal deficit present.     Mental Status: She is alert and oriented to person, place, and time.  Psychiatric:        Mood and Affect: Mood normal.        Behavior: Behavior normal.      ASSESSMENT & PLAN: A total of 42 minutes was spent with the patient and counseling/coordination of care regarding preparing for this visit, review of most recent office visit notes, review  of most recent nephrologist office visit notes, review of multiple chronic medical conditions and their management, management of peripheral edema, review of all medications and changes made, review of most recent bloodwork results, review of health maintenance items, education on nutrition, prognosis, documentation, and need for follow up.   Problem List Items Addressed This Visit        Cardiovascular and Mediastinum   HTN (hypertension) - Primary   BP Readings from Last 3 Encounters:  08/03/24 122/80  06/27/24 (!) 146/80  05/18/24 (!) 146/78  Well-controlled hypertension Continue Benicar  HCT 40-25 mg daily and amlodipine  5 mg daily Dietary approaches to stop hypertension discussed       Relevant Medications   furosemide  (LASIX ) 20 MG tablet     Genitourinary   Stage 3b chronic kidney disease (HCC)   As per most recent nephrologist office visit notes:  Plan:  -Urinalysis negative for blood, protein. -No significant risk factor for CKD progression. -Phos, bicarb, PTH, vitamin D , hemoglobin at goal for CKD. -Blood pressure well-controlled -Continue to avoid chronic NSAID use. -Return to clinic in 1 year or PRN.         Other   Body mass index (BMI) of 40.0-44.9 in adult [facility])   History of osteoarthritis   Stable. Takes Tylenol  as needed. Advised to avoid NSAIDs.       Bilateral leg edema   Better than before Encouraged to elevate legs when resting at home Increase Lasix  to 40 mg daily Low-salt diet      Relevant Medications   furosemide  (LASIX ) 20 MG tablet   Patient Instructions  Increase Lasix  to 40 mg daily Continue all other medications  Peripheral Edema  Peripheral edema is swelling that is caused by a buildup of fluid. Peripheral edema most often affects the lower legs, ankles, and feet. It can also develop in the arms, hands, and face. The area of the body that has peripheral edema will look swollen. It may also feel heavy or warm. Your clothes may start to feel tight. Pressing on the area may make a temporary dent in your skin (pitting edema). You may not be able to move your swollen arm or leg as much as usual. There are many causes of peripheral edema. It can happen because of a complication of other conditions such as heart failure, kidney disease, or a problem with your circulation. It also can be a side effect of certain medicines or  happen because of an infection. It often happens to women during pregnancy. Sometimes, the cause is not known. Follow these instructions at home: Managing pain, stiffness, and swelling  Raise (elevate) your legs while you are sitting or lying down. Move around often to prevent stiffness and to reduce swelling. Do not sit or stand for long periods of time. Do not wear tight clothing. Do not wear garters on your upper legs. Exercise your legs to get your circulation going. This helps to move the fluid back into your blood vessels, and it may help the swelling go down. Wear compression stockings as told by your health care provider. These stockings help to prevent blood clots and reduce swelling in your legs. It is important that these are the correct size. These stockings should be prescribed by your doctor to prevent possible injuries. If elastic bandages or wraps are recommended, use them as told by your health care provider. Medicines Take over-the-counter and prescription medicines only as told by your health care provider. Your health care provider may prescribe  medicine to help your body get rid of excess water (diuretic). Take this medicine if you are told to take it. General instructions Eat a low-salt (low-sodium) diet as told by your health care provider. Sometimes, eating less salt may reduce swelling. Pay attention to any changes in your symptoms. Moisturize your skin daily to help prevent skin from cracking and draining. Keep all follow-up visits. This is important. Contact a health care provider if: You have a fever. You have swelling in only one leg. You have increased swelling, redness, or pain in one or both of your legs. You have drainage or sores at the area where you have edema. Get help right away if: You have edema that starts suddenly or is getting worse, especially if you are pregnant or have a medical condition. You develop shortness of breath, especially when you are  lying down. You have pain in your chest or abdomen. You feel weak. You feel like you will faint. These symptoms may be an emergency. Get help right away. Call 911. Do not wait to see if the symptoms will go away. Do not drive yourself to the hospital. Summary Peripheral edema is swelling that is caused by a buildup of fluid. Peripheral edema most often affects the lower legs, ankles, and feet. Move around often to prevent stiffness and to reduce swelling. Do not sit or stand for long periods of time. Pay attention to any changes in your symptoms. Contact a health care provider if you have edema that starts suddenly or is getting worse, especially if you are pregnant or have a medical condition. Get help right away if you develop shortness of breath, especially when lying down. This information is not intended to replace advice given to you by your health care provider. Make sure you discuss any questions you have with your health care provider. Document Revised: 04/07/2021 Document Reviewed: 04/07/2021 Elsevier Patient Education  2024 Elsevier Inc.     Emil Schaumann, MD [facility]    [1] No Known Allergies

## 2024-08-06 ENCOUNTER — Encounter (HOSPITAL_COMMUNITY): Payer: Self-pay

## 2024-08-06 ENCOUNTER — Emergency Department (HOSPITAL_COMMUNITY)

## 2024-08-06 ENCOUNTER — Other Ambulatory Visit: Payer: Self-pay

## 2024-08-06 ENCOUNTER — Emergency Department (HOSPITAL_COMMUNITY)
Admission: EM | Admit: 2024-08-06 | Discharge: 2024-08-06 | Disposition: A | Discharge status: Home / Self Care | Attending: Emergency Medicine | Admitting: Emergency Medicine

## 2024-08-06 DIAGNOSIS — Z79899 Other long term (current) drug therapy: Secondary | ICD-10-CM | POA: Diagnosis not present

## 2024-08-06 DIAGNOSIS — M79661 Pain in right lower leg: Secondary | ICD-10-CM | POA: Insufficient documentation

## 2024-08-06 DIAGNOSIS — R600 Localized edema: Secondary | ICD-10-CM | POA: Diagnosis not present

## 2024-08-06 DIAGNOSIS — M7989 Other specified soft tissue disorders: Secondary | ICD-10-CM | POA: Diagnosis not present

## 2024-08-06 DIAGNOSIS — I10 Essential (primary) hypertension: Secondary | ICD-10-CM | POA: Diagnosis not present

## 2024-08-06 LAB — COMPREHENSIVE METABOLIC PANEL WITH GFR
ALT: 11 U/L (ref 0–44)
AST: 27 U/L (ref 15–41)
Albumin: 4.4 g/dL (ref 3.5–5.0)
Alkaline Phosphatase: 65 U/L (ref 38–126)
Anion gap: 13 (ref 5–15)
BUN: 27 mg/dL — ABNORMAL HIGH (ref 8–23)
CO2: 25 mmol/L (ref 22–32)
Calcium: 9.9 mg/dL (ref 8.9–10.3)
Chloride: 104 mmol/L (ref 98–111)
Creatinine, Ser: 1.5 mg/dL — ABNORMAL HIGH (ref 0.44–1.00)
GFR, Estimated: 34 mL/min — ABNORMAL LOW
Glucose, Bld: 101 mg/dL — ABNORMAL HIGH (ref 70–99)
Potassium: 3.8 mmol/L (ref 3.5–5.1)
Sodium: 142 mmol/L (ref 135–145)
Total Bilirubin: 0.9 mg/dL (ref 0.0–1.2)
Total Protein: 7.5 g/dL (ref 6.5–8.1)

## 2024-08-06 LAB — CBC WITH DIFFERENTIAL/PLATELET
Abs Immature Granulocytes: 0.02 K/uL (ref 0.00–0.07)
Basophils Absolute: 0 K/uL (ref 0.0–0.1)
Basophils Relative: 1 %
Eosinophils Absolute: 0.1 K/uL (ref 0.0–0.5)
Eosinophils Relative: 2 %
HCT: 37.2 % (ref 36.0–46.0)
Hemoglobin: 11.9 g/dL — ABNORMAL LOW (ref 12.0–15.0)
Immature Granulocytes: 1 %
Lymphocytes Relative: 32 %
Lymphs Abs: 1.4 K/uL (ref 0.7–4.0)
MCH: 31.9 pg (ref 26.0–34.0)
MCHC: 32 g/dL (ref 30.0–36.0)
MCV: 99.7 fL (ref 80.0–100.0)
Monocytes Absolute: 0.3 K/uL (ref 0.1–1.0)
Monocytes Relative: 7 %
Neutro Abs: 2.5 K/uL (ref 1.7–7.7)
Neutrophils Relative %: 57 %
Platelets: 200 K/uL (ref 150–400)
RBC: 3.73 MIL/uL — ABNORMAL LOW (ref 3.87–5.11)
RDW: 12.8 % (ref 11.5–15.5)
WBC: 4.3 K/uL (ref 4.0–10.5)
nRBC: 0 % (ref 0.0–0.2)

## 2024-08-06 LAB — PRO BRAIN NATRIURETIC PEPTIDE: Pro Brain Natriuretic Peptide: 137 pg/mL

## 2024-08-06 NOTE — ED Triage Notes (Signed)
 PT arrives via POV. PT reports pain and swelling to right leg since Thursday. Denies injury. She is AxOx4.

## 2024-08-06 NOTE — Discharge Instructions (Signed)
 The ultrasound did not show any signs of blood clot.  Continue the increased Lasix  dose recommended by your primary care doctor

## 2024-08-06 NOTE — Progress Notes (Signed)
 VASCULAR LAB    Bilateral lower extremity venous duplex has been performed.  See CV proc for preliminary results.  Relayed results to Dr. Randol via secure chat.  Othel Hoogendoorn, RVT 08/06/2024, 3:10 PM

## 2024-08-06 NOTE — ED Provider Notes (Signed)
 " [place] [facility] Provider Note   CSN: [id number] Arrival date & time: 08/06/24  1045     Patient presents with: Leg Pain   Tammy Mccullough is a 83 y.o. female.    Leg Pain    Patient has a history of glaucoma hypertension arthritis renal sufficiency.  Patient states she is having pain in both of her legs although primarily her right leg.  She has noticed some calf discomfort.  Patient states she has been having some issues with discomfort that she saw an orthopedic doctor for several months ago.  Patient did follow-up with her primary care doctor recently for her edema.  She has had this in the past..  According to her primary care doctor note on the 18th of this month patient was to continue her Lasix  and to increase it to 40 mg daily.  Patient came to the ED because she still continues to have this leg swelling.  She is not have any trams pain or shortness of breath.  Patient was concerned about possible blood clot  Prior to Admission medications  Medication Sig Start Date End Date Taking? Authorizing Provider  acetaminophen  (TYLENOL ) 500 MG tablet Take 500 mg by mouth every 8 (eight) hours as needed.    [provider]  amLODipine  (NORVASC ) 5 MG tablet TAKE 1 TABLET(5 MG) BY MOUTH DAILY 06/29/24   Purcell Emil Schanz, MD  diclofenac  Sodium (VOLTAREN ) 1 % GEL Apply 2 g topically 4 (four) times daily. 08/28/20   Levora Reyes JONELLE, MD  EYSUVIS 0.25 % SUSP Apply 1 drop to eye 4 (four) times daily. 02/29/24   [provider]  furosemide  (LASIX ) 20 MG tablet Take 2 tablets (40 mg total) by mouth daily. 08/03/24   Purcell Emil Schanz, MD  gabapentin  (NEURONTIN ) 100 MG capsule Take 1 capsule (100 mg total) by mouth at bedtime. 05/11/24   Sagardia, Miguel Jose, MD  IYUZEH 0.005 % SOLN SMARTSIG:1 Drop(s) In Eye(s) Every Evening 07/12/23   [provider]  neomycin -polymyxin-dexamethasone (MAXITROL) 0.1 % ophthalmic  suspension Apply 1 drop to eye 2 (two) times daily. 04/19/24   [provider]  olmesartan -hydrochlorothiazide  (BENICAR  HCT) 40-25 MG tablet TAKE 1 TABLET BY MOUTH DAILY 08/05/23   Sagardia, Emil Schanz, MD  Polyethyl Glycol-Propyl Glycol (SYSTANE HYDRATION PF OP) Apply to eye. Dry eyes    [provider]  Povidone (IVIZIA DRY EYES OP) Apply 1 drop to eye as needed.    [provider]  ZIOPTAN 0.0015 % SOLN  09/26/16   [provider]    Allergies: Patient has no known allergies.    Review of Systems  Updated Vital Signs BP (!) 149/60 (BP Location: Right Arm)   Pulse 98   Temp 98.3 F (36.8 C)   Resp 18   SpO2 98%   Physical Exam Vitals and nursing note reviewed.  Constitutional:      General: She is not in acute distress.    Appearance: She is well-developed.  HENT:     Head: Normocephalic and atraumatic.     Right Ear: External ear normal.     Left Ear: External ear normal.  Eyes:     General: No scleral icterus.       Right eye: No discharge.        Left eye: No discharge.     Conjunctiva/sclera: Conjunctivae normal.  Neck:     Trachea: No tracheal deviation.  Cardiovascular:  Rate and Rhythm: Normal rate and regular rhythm.  Pulmonary:     Effort: Pulmonary effort is normal. No respiratory distress.     Breath sounds: No stridor.  Abdominal:     General: There is no distension.  Musculoskeletal:        General: No swelling or deformity.     Cervical back: Neck supple.     Right lower leg: Edema present.     Left lower leg: Edema present.     Comments: Mild tenderness right calf  Skin:    General: Skin is warm and dry.     Findings: No rash.  Neurological:     Mental Status: She is alert. Mental status is at baseline.     Cranial Nerves: No dysarthria or facial asymmetry.     Motor: No seizure activity.     (all labs ordered are listed, but only abnormal results are displayed) Labs Reviewed  COMPREHENSIVE METABOLIC  PANEL WITH GFR - Abnormal; Notable for the following components:      Result Value   Glucose, Bld 101 (*)    BUN 27 (*)    Creatinine, Ser 1.50 (*)    GFR, Estimated 34 (*)    All other components within normal limits  CBC WITH DIFFERENTIAL/PLATELET - Abnormal; Notable for the following components:   RBC 3.73 (*)    Hemoglobin 11.9 (*)    All other components within normal limits  PRO BRAIN NATRIURETIC PEPTIDE    EKG: None  Radiology: No results found.   Procedures   Medications Ordered in the ED - No data to display  Clinical Course as of 08/06/24 1522  Sun Aug 06, 2024  1338 CBC with Differential(!) CBC metabolic panel unremarkable.  BNP normal [JK]  1509 Doppler study negative for DVT [JK]    Clinical Course User Index [JK] Randol Simmonds, MD                                 Medical Decision Making Problems Addressed: Peripheral edema: chronic illness or injury with exacerbation, progression, or side effects of treatment  Amount and/or Complexity of Data Reviewed Labs: ordered. Decision-making details documented in ED Course. Radiology: ordered.   Patient presented ED with complaints of discomfort in the lower extremities.  Patient does have history of peripheral edema.  She also her doctor recently was instructed to increase her Lasix  dose.  Exam not suggestive of acute infection.  Laboratory tests are unremarkable.  Patient does not have any respiratory difficulty.  Doppler study was performed.  No evidence of DVT.  Will have patient continue her outpatient Lasix  regimen.  Stable to follow-up with PCP     Final diagnoses:  Peripheral edema    ED Discharge Orders     None          Randol Simmonds, MD 08/06/24 1523  "

## 2024-08-06 NOTE — ED Provider Triage Note (Signed)
 Emergency Medicine Provider Triage Evaluation Note  Tammy Mccullough , a 83 y.o. female  was evaluated in triage.  Pt complains of leg swelling. Pt sts in August of this year her orthopedist inject cortisone into both of her knees but she mentioned he also inject it into the skin in front of both knees as well.  From that point on she notice pain and swelling to both legs.  She voice concern that the injections may have caused her leg pain/swelling.  Denies fever, chills, cp, sob.  Was taking fluid pills without relief.  No hx of DVT  Review of Systems  Positive: As above Negative: As above  Physical Exam  BP (!) 149/60 (BP Location: Right Arm)   Pulse 98   Temp 98.3 F (36.8 C)   Resp 18   SpO2 98%  Gen:   Awake, no distress   Resp:  Normal effort  MSK:   Moves extremities without difficulty  Other:  Edema to both legs with some anterior erythematous patches to RLE  Medical Decision Making  Medically screening exam initiated at 11:42 AM.  Appropriate orders placed.  Tammy Mccullough was informed that the remainder of the evaluation will be completed by another provider, this initial triage assessment does not replace that evaluation, and the importance of remaining in the ED until their evaluation is complete.     Nivia Colon, PA-C 08/06/24 1145

## 2024-08-07 ENCOUNTER — Ambulatory Visit: Admitting: Physician Assistant

## 2024-08-08 ENCOUNTER — Other Ambulatory Visit: Payer: Self-pay | Admitting: Emergency Medicine

## 2024-08-08 DIAGNOSIS — I10 Essential (primary) hypertension: Secondary | ICD-10-CM

## 2024-08-15 ENCOUNTER — Ambulatory Visit: Admitting: Physician Assistant

## 2024-08-21 ENCOUNTER — Telehealth: Payer: Self-pay

## 2024-08-21 NOTE — Telephone Encounter (Signed)
 Since last OV patient went to hosptial for leg pain and swelling. She was treated and wants to follow up with provider. At 12/18 OV patient Lasix  was increased but that has not helped much, which is what took her to the ED on 12/21. She has documentation stating she has peripheral neuropathy and not Edema, and want so discuss plans on how to manage this.

## 2024-08-23 ENCOUNTER — Ambulatory Visit: Admitting: Emergency Medicine

## 2024-08-23 ENCOUNTER — Encounter: Payer: Self-pay | Admitting: Emergency Medicine

## 2024-08-23 VITALS — BP 128/78 | HR 98 | Temp 98.0°F | Ht 64.0 in | Wt 204.0 lb

## 2024-08-23 DIAGNOSIS — M792 Neuralgia and neuritis, unspecified: Secondary | ICD-10-CM

## 2024-08-23 DIAGNOSIS — I10 Essential (primary) hypertension: Secondary | ICD-10-CM | POA: Diagnosis not present

## 2024-08-23 DIAGNOSIS — Z6841 Body Mass Index (BMI) 40.0 and over, adult: Secondary | ICD-10-CM | POA: Diagnosis not present

## 2024-08-23 DIAGNOSIS — R600 Localized edema: Secondary | ICD-10-CM | POA: Diagnosis not present

## 2024-08-23 DIAGNOSIS — N1832 Chronic kidney disease, stage 3b: Secondary | ICD-10-CM

## 2024-08-23 MED ORDER — DULOXETINE HCL 30 MG PO CPEP: 30.0000 mg | ORAL_CAPSULE | Freq: Every day | ORAL | 3 refills | Status: AC

## 2024-08-23 NOTE — Assessment & Plan Note (Signed)
 Diet and nutrition discussed.  Advised to decrease amount of daily carbohydrate intake and daily calories and increase amount of plant-based protein in her diet.

## 2024-08-23 NOTE — Assessment & Plan Note (Signed)
 Gabapentin  helping at night Recommend 100 to 200 mg at bedtime Recommend to start Cymbalta  30 mg daily

## 2024-08-23 NOTE — Assessment & Plan Note (Signed)
 BP Readings from Last 3 Encounters:  08/23/24 (!) 140/80  08/06/24 (!) 150/72  08/03/24 122/80  Elevated blood pressure reading in the office but normal readings at home Well-controlled hypertension Continue Benicar  HCT 40-25 mg daily and amlodipine  5 mg daily Dietary approaches to stop hypertension discussed

## 2024-08-23 NOTE — Progress Notes (Signed)
 Tammy Mccullough 84 y.o.   Chief Complaint  Patient presents with   Follow-up    Pt states that she wants to know what kind of treatment she have for neuropathy     HISTORY OF PRESENT ILLNESS: This is a 84 y.o. female complaining of intermittent pains to both legs for several months Gabapentin  helping at bedtime No other complaints or medical concerns today.  HPI   Prior to Admission medications  Medication Sig Start Date End Date Taking? Authorizing Provider  acetaminophen  (TYLENOL ) 500 MG tablet Take 500 mg by mouth every 8 (eight) hours as needed.   Yes [provider]  amLODipine  (NORVASC ) 5 MG tablet TAKE 1 TABLET(5 MG) BY MOUTH DAILY 06/29/24  Yes Florabelle Cardin, Emil Schanz, MD  diclofenac  Sodium (VOLTAREN ) 1 % GEL Apply 2 g topically 4 (four) times daily. 08/28/20  Yes Levora Reyes JONELLE, MD  DULoxetine  (CYMBALTA ) 30 MG capsule Take 1 capsule (30 mg total) by mouth daily. 08/23/24 09/22/24 Yes Kiefer Opheim, Emil Schanz, MD  EYSUVIS 0.25 % SUSP Apply 1 drop to eye 4 (four) times daily. 02/29/24  Yes [provider]  furosemide  (LASIX ) 20 MG tablet Take 2 tablets (40 mg total) by mouth daily. 08/03/24  Yes Dasja Brase, Emil Schanz, MD  gabapentin  (NEURONTIN ) 100 MG capsule Take 1 capsule (100 mg total) by mouth at bedtime. 05/11/24  Yes Febe Champa, Emil Schanz, MD  IYUZEH 0.005 % SOLN SMARTSIG:1 Drop(s) In Eye(s) Every Evening 07/12/23  Yes [provider]  neomycin -polymyxin-dexamethasone (MAXITROL) 0.1 % ophthalmic suspension Apply 1 drop to eye 2 (two) times daily. 04/19/24  Yes [provider]  olmesartan -hydrochlorothiazide  (BENICAR  HCT) 40-25 MG tablet TAKE 1 TABLET BY MOUTH DAILY 08/08/24  Yes Webb, Padonda B, FNP  Polyethyl Glycol-Propyl Glycol (SYSTANE HYDRATION PF OP) Apply to eye. Dry eyes   Yes [provider]  Povidone (IVIZIA DRY EYES OP) Apply 1 drop to eye as needed.   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   Cataract    Glaucoma    Glucose intolerance (impaired glucose tolerance)    Hypertension    Renal insufficiency     Past Surgical History:  Procedure Laterality Date   CATARACT EXTRACTION Right 07-2014   CATARACT EXTRACTION Left 01/16/2016   COLONOSCOPY     10 +yrs ago in [place], normal per pt   VAGINAL DELIVERY     x2    Social History   Socioeconomic History   Marital status:  Widowed    Spouse name: Not on file   Number of children: 2   Years of education: Not on file   Highest education level: Not on file  Occupational History   Occupation: retired  Tobacco Use   Smoking status: Never   Smokeless tobacco: Never  Vaping Use   Vaping status: Never Used  Substance and Sexual Activity   Alcohol use: Not Currently    Alcohol/week: 6.0 standard drinks of alcohol    Types: 6 Standard drinks or equivalent per week    Comment: 2-3   Drug use: No   Sexual activity: Not  Currently  Other Topics Concern   Not on file  Social History Narrative   Marital status: widowed since 2010 after 15 years of marriage.  Not dating but would like to be.      Children:  2 Children; no grandchildren; 2 adopted grandchildren.  One son is disabled; patient supports; son is obese.      Living: alone with 1 cat.      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      Tobacco: never      Alcohol: weekends liquor; some during the week.  3-5 cups per week.      Drugs: none      Exercise: water aerobics three days per week.  [employer].      Seatbelt: 100%;      Guns: none      ADLs:: independent with ADLs.   Drives. No assistant devices.      Advanced Directives: yes; healthcare POA:  Older brother Davia Ramseur);  FULL CODE but no prolonged measures.     Social Drivers of Health   Tobacco Use: Low Risk (08/23/2024)   Patient History    Smoking Tobacco Use: Never    Smokeless Tobacco Use: Never    Passive Exposure: Not on file  Financial Resource Strain: Low Risk (09/10/2023)   Overall Financial Resource Strain (CARDIA)    Difficulty of Paying Living Expenses: Not hard at all  Food Insecurity: No Food Insecurity (09/10/2023)   Hunger Vital Sign    Worried About Running Out of Food in the Last Year: Never true    Ran Out of Food in the Last Year: Never true  Transportation Needs: No Transportation Needs (09/10/2023)   PRAPARE - Administrator, Civil Service (Medical): No    Lack of Transportation (Non-Medical): No  Physical Activity: Insufficiently Active (09/10/2023)   Exercise Vital Sign    Days of Exercise per Week: 7 days    Minutes of Exercise per Session: 10 min  Stress: No Stress Concern Present (09/10/2023)   [employer] of Occupational Health - Occupational Stress Questionnaire    Feeling of Stress : Not at all  Social Connections: Socially Isolated (09/10/2023)   Social Connection and  Isolation Panel    Frequency of Communication with Friends and Family: More than three times a week    Frequency of Social Gatherings with Friends and Family: More than three times a week    Attends Religious Services: Never    Database Administrator or Organizations: No    Attends Banker Meetings: Never    Marital Status: Widowed  Intimate Partner Violence: Not At Risk (09/10/2023)   Humiliation, Afraid, Rape, and Kick questionnaire    Fear of Current or Ex-Partner: No    Emotionally Abused: No    Physically  Abused: No    Sexually Abused: No  Depression (PHQ2-9): Low Risk (02/02/2024)   Depression (PHQ2-9)    PHQ-2 Score: 0  Alcohol Screen: Low Risk (09/10/2023)   Alcohol Screen    Last Alcohol Screening Score (AUDIT): 0  Housing: Unknown (09/10/2023)   Housing Stability Vital Sign    Unable to Pay for Housing in the Last Year: No    Number of Times Moved in the Last Year: Not on file    Homeless in the Last Year: No  Utilities: Not At Risk (09/10/2023)   AHC Utilities    Threatened with loss of utilities: No  Health Literacy: Adequate Health Literacy (09/10/2023)   B1300 Health Literacy    Frequency of need for help with medical instructions: Never    Family History  Problem Relation Age of Onset   Diabetes Mother    Heart disease Mother        unsure   COPD Father    Diabetes Sister    Hypertension Sister    Kidney disease Sister    Diabetes Brother    Diabetes Brother    Colon cancer Neg Hx    Rectal cancer Neg Hx    Stomach cancer Neg Hx      Review of Systems  Constitutional: Negative.  Negative for chills and fever.  HENT: Negative.  Negative for congestion and sore throat.   Respiratory: Negative.  Negative for cough and shortness of breath.   Cardiovascular: Negative.  Negative for chest pain and palpitations.  Gastrointestinal:  Negative for abdominal pain, diarrhea, nausea and vomiting.  Genitourinary: Negative.  Negative for dysuria and  hematuria.  Skin: Negative.  Negative for rash.  Neurological: Negative.  Negative for dizziness and headaches.  All other systems reviewed and are negative.   Vitals:   08/23/24 1444 08/23/24 1446  BP: (!) 140/80 (!) 140/80  Pulse: 98   Temp: 98 F (36.7 C)   SpO2: 98%     Physical Exam Vitals reviewed.  Constitutional:      Appearance: Normal appearance. She is obese.  HENT:     Head: Normocephalic.     Mouth/Throat:     Mouth: Mucous membranes are moist.     Pharynx: Oropharynx is clear.  Eyes:     Extraocular Movements: Extraocular movements intact.     Pupils: Pupils are equal, round, and reactive to light.  Cardiovascular:     Rate and Rhythm: Normal rate and regular rhythm.     Pulses: Normal pulses.     Heart sounds: Normal heart sounds.  Pulmonary:     Effort: Pulmonary effort is normal.     Breath sounds: Normal breath sounds.  Musculoskeletal:     Cervical back: No tenderness.     Right lower leg: Edema present.     Left lower leg: Edema present.  Lymphadenopathy:     Cervical: No cervical adenopathy.  Skin:    General: Skin is warm and dry.  Neurological:     General: No focal deficit present.     Mental Status: She is alert and oriented to person, place, and time.  Psychiatric:        Mood and Affect: Mood normal.        Behavior: Behavior normal.      ASSESSMENT & PLAN: A total of 43 minutes was spent with the patient and counseling/coordination of care regarding preparing for this visit, review of most recent office visit notes, review of multiple chronic medical  conditions and their management, review of all medications, review of most recent bloodwork results, review of health maintenance items, education on nutrition, prognosis, documentation, and need for follow up.  Problem List Items Addressed This Visit       Cardiovascular and Mediastinum   HTN (hypertension)   BP Readings from Last 3 Encounters:  08/23/24 (!) 140/80  08/06/24 (!)  150/72  08/03/24 122/80  Elevated blood pressure reading in the office but normal readings at home Well-controlled hypertension Continue Benicar  HCT 40-25 mg daily and amlodipine  5 mg daily Dietary approaches to stop hypertension discussed        Genitourinary   Stage 3b chronic kidney disease (HCC)   As per most recent nephrologist office visit notes:  Plan:  -Urinalysis negative for blood, protein. -No significant risk factor for CKD progression. -Phos, bicarb, PTH, vitamin D , hemoglobin at goal for CKD. -Blood pressure well-controlled -Continue to avoid chronic NSAID use. -Return to clinic in 1 year or PRN.         Other   Body mass index (BMI) of 40.0-44.9 in adult [facility])   Diet and nutrition discussed. Advised to decrease amount of daily carbohydrate intake and daily calories and increase amount of plant-based protein in her diet      Bilateral leg edema   Better than before Encouraged to elevate legs when resting at home Increase Lasix  to 40 mg daily Low-salt diet      Neuropathic pain - Primary   Gabapentin  helping at night Recommend 100 to 200 mg at bedtime Recommend to start Cymbalta  30 mg daily      Relevant Medications   DULoxetine  (CYMBALTA ) 30 MG capsule   Patient Instructions  Nerve Pain (Neuropathic Pain): What to Know Nerve pain, also called neuropathic pain, happens when nerves in your body are damaged. This type of pain can make you feel more pain than usual. Even a small touch can hurt a lot. Nerve pain can last for a long time (be chronic) and can be hard to treat. The pain can be different for each person. It might: Start suddenly or slowly. Come and go as the damaged nerves heal, or it may stay the same for years. Cause stress, trouble sleeping, and make life harder. What are the causes? Many things can cause nerve damage, such as: Metabolic problems like: Diabetes. This is the most common cause. Lack of vitamins like B12. Medicines and  chemicals. Nerve damage can happen from medicines that kill cancer cells (chemotherapy) or from drinking too much alcohol. Any injury that cuts, crushes, or stretches a nerve. Compression. If a nerve gets trapped or compressed for too long, the blood supply to the nerve can be cut off. Blood vessel disease. This can cause pain by decreasing blood supply and oxygen to nerves. Autoimmune diseases like lupus or multiple sclerosis. These are diseases where the body's defense system (immune system) attacks its own nerves. Infections with germs, also called viruses, such as shingles. Diseases that are passed down through families. What increases the risk? You're more likely to get nerve pain if: You have diabetes. You smoke. You drink too much alcohol. You take certain medicines, like those for cancer or immune system problems. What are the signs or symptoms? The main symptom is pain. Nerve pain is often described as: Burning. Shock-like. Stinging. Hot or cold. Itching. Tingling. Prickling. How is this diagnosed? No single test can diagnose nerve pain. It's diagnosed based on: A physical exam and your symptoms. Your health  care provider will ask you about your pain. You may be asked to use a pain scale to describe how bad your pain is. Tests to find nerve damage, like: Nerve conduction studies and EMG to check how well nerves and muscles work. Skin biopsy, which is when a small piece of skin is removed for testing. This test looks for a nerve problem called small fiber neuropathy. Imaging tests, such as: X-rays. CT scan. MRI. How is this treated? Treatment can change over time. You might need to try different treatments or a mix of them. Options include: Treating the cause such as managing diabetes or fixing vitamin levels. Stopping medicines that can cause nerve pain. Taking medicines to relieve pain. These may include: Pain medicines. Anti-seizure medicines. Antidepressant  medicines. Pain-relieving patches or creams that are put on painful areas of skin. A medicine to numb the area, which can be injected as a nerve block. Transcutaneous nerve stimulation. This uses electrical currents to block painful nerve signals. The treatment is painless. Other treatments, such as: Acupuncture. Meditation. Massage. Occupational or physical therapy. Pain management programs. Counseling. Follow these instructions at home: Medicines  Take your medicines only as told. You may need to take steps to help treat or prevent trouble pooping (constipation), such as: Taking medicines to help you poop. Eating foods high in fiber, like beans, whole grains, and fresh fruits and vegetables. Drinking more fluids as told. Ask your provider if it's safe to drive or use machines while taking your medicine. Lifestyle  Have a good support system at home. Join a chronic pain support group. Consider talking with a mental health care provider about how to cope with the pain. Do not smoke, vape, or use nicotine or tobacco. Do not drink alcohol. General instructions Learn as much as you can about your condition. Work closely with all your providers to find the treatment plan that works best for you. Ask what things are safe for you to do at home. Exercise as told. Keep all follow-up visits. Your provider will check if the treatments are working and change them if needed. Contact a health care provider if: Your pain treatments aren't working. You're having side effects from your medicines. You feel very tired, sad, or anxious. Get help right away if: You feel like you may hurt yourself or others. You have thoughts about taking your own life. You have other thoughts or feelings that worry you. These symptoms may be an emergency. Take one of these steps right away: Go to your nearest emergency room. Call 911. Contact the Suicide & Crisis Lifeline (24/7, free and confidential): Call  or text 988. Chat online at chat.newsactor.se. For Veterans and their loved ones: Call 988 and press 1. Text the [employer] at 3184759967. Chat online at reservationslist.si. This information is not intended to replace advice given to you by your health care provider. Make sure you discuss any questions you have with your health care provider. Document Revised: 10/20/2023 Document Reviewed: 10/20/2023 Elsevier Patient Education  2025 Elsevier Inc.      Emil Schaumann, MD [facility]     [1] No Known Allergies

## 2024-08-23 NOTE — Assessment & Plan Note (Signed)
 As per most recent nephrologist office visit notes:  Plan:  -Urinalysis negative for blood, protein. -No significant risk factor for CKD progression. -Phos, bicarb, PTH, vitamin D , hemoglobin at goal for CKD. -Blood pressure well-controlled -Continue to avoid chronic NSAID use. -Return to clinic in 1 year or PRN.

## 2024-08-23 NOTE — Patient Instructions (Signed)
 Nerve Pain (Neuropathic Pain): What to Know Nerve pain, also called neuropathic pain, happens when nerves in your body are damaged. This type of pain can make you feel more pain than usual. Even a small touch can hurt a lot. Nerve pain can last for a long time (be chronic) and can be hard to treat. The pain can be different for each person. It might: Start suddenly or slowly. Come and go as the damaged nerves heal, or it may stay the same for years. Cause stress, trouble sleeping, and make life harder. What are the causes? Many things can cause nerve damage, such as: Metabolic problems like: Diabetes. This is the most common cause. Lack of vitamins like B12. Medicines and chemicals. Nerve damage can happen from medicines that kill cancer cells (chemotherapy) or from drinking too much alcohol. Any injury that cuts, crushes, or stretches a nerve. Compression. If a nerve gets trapped or compressed for too long, the blood supply to the nerve can be cut off. Blood vessel disease. This can cause pain by decreasing blood supply and oxygen to nerves. Autoimmune diseases like lupus or multiple sclerosis. These are diseases where the body's defense system (immune system) attacks its own nerves. Infections with germs, also called viruses, such as shingles. Diseases that are passed down through families. What increases the risk? You're more likely to get nerve pain if: You have diabetes. You smoke. You drink too much alcohol. You take certain medicines, like those for cancer or immune system problems. What are the signs or symptoms? The main symptom is pain. Nerve pain is often described as: Burning. Shock-like. Stinging. Hot or cold. Itching. Tingling. Prickling. How is this diagnosed? No single test can diagnose nerve pain. It's diagnosed based on: A physical exam and your symptoms. Your health care provider will ask you about your pain. You may be asked to use a pain scale to describe how  bad your pain is. Tests to find nerve damage, like: Nerve conduction studies and EMG to check how well nerves and muscles work. Skin biopsy, which is when a small piece of skin is removed for testing. This test looks for a nerve problem called small fiber neuropathy. Imaging tests, such as: X-rays. CT scan. MRI. How is this treated? Treatment can change over time. You might need to try different treatments or a mix of them. Options include: Treating the cause such as managing diabetes or fixing vitamin levels. Stopping medicines that can cause nerve pain. Taking medicines to relieve pain. These may include: Pain medicines. Anti-seizure medicines. Antidepressant medicines. Pain-relieving patches or creams that are put on painful areas of skin. A medicine to numb the area, which can be injected as a nerve block. Transcutaneous nerve stimulation. This uses electrical currents to block painful nerve signals. The treatment is painless. Other treatments, such as: Acupuncture. Meditation. Massage. Occupational or physical therapy. Pain management programs. Counseling. Follow these instructions at home: Medicines  Take your medicines only as told. You may need to take steps to help treat or prevent trouble pooping (constipation), such as: Taking medicines to help you poop. Eating foods high in fiber, like beans, whole grains, and fresh fruits and vegetables. Drinking more fluids as told. Ask your provider if it's safe to drive or use machines while taking your medicine. Lifestyle  Have a good support system at home. Join a chronic pain support group. Consider talking with a mental health care provider about how to cope with the pain. Do not smoke, vape, or  use nicotine or tobacco. Do not drink alcohol. General instructions Learn as much as you can about your condition. Work closely with all your providers to find the treatment plan that works best for you. Ask what things are  safe for you to do at home. Exercise as told. Keep all follow-up visits. Your provider will check if the treatments are working and change them if needed. Contact a health care provider if: Your pain treatments aren't working. You're having side effects from your medicines. You feel very tired, sad, or anxious. Get help right away if: You feel like you may hurt yourself or others. You have thoughts about taking your own life. You have other thoughts or feelings that worry you. These symptoms may be an emergency. Take one of these steps right away: Go to your nearest emergency room. Call 911. Contact the Suicide & Crisis Lifeline (24/7, free and confidential): Call or text 988. Chat online at chat.NewsActor.se. For Veterans and their loved ones: Call 988 and press 1. Text the [employer] at 848 299 4225. Chat online at ReservationsList.si. This information is not intended to replace advice given to you by your health care provider. Make sure you discuss any questions you have with your health care provider. Document Revised: 10/20/2023 Document Reviewed: 10/20/2023 Elsevier Patient Education  2025 [employer].

## 2024-08-23 NOTE — Assessment & Plan Note (Signed)
 Better than before Encouraged to elevate legs when resting at home Increase Lasix  to 40 mg daily Low-salt diet

## 2024-08-31 ENCOUNTER — Ambulatory Visit: Admitting: Physician Assistant

## 2024-09-05 ENCOUNTER — Other Ambulatory Visit: Payer: Self-pay | Admitting: Emergency Medicine

## 2024-09-05 DIAGNOSIS — R202 Paresthesia of skin: Secondary | ICD-10-CM

## 2024-09-11 ENCOUNTER — Ambulatory Visit: Payer: Medicare Other

## 2024-09-13 ENCOUNTER — Ambulatory Visit (INDEPENDENT_AMBULATORY_CARE_PROVIDER_SITE_OTHER)

## 2024-09-13 VITALS — Ht 64.0 in | Wt 214.0 lb

## 2024-09-13 DIAGNOSIS — Z0000 Encounter for general adult medical examination without abnormal findings: Secondary | ICD-10-CM

## 2024-09-13 NOTE — Progress Notes (Signed)
 "  Chief Complaint  Patient presents with   Medicare Wellness     Subjective:   Tammy Mccullough is a 84 y.o. female who presents for a Medicare Annual Wellness Visit.  Visit info / Clinical Intake: Medicare Wellness Visit Type:: Subsequent Annual Wellness Visit Persons participating in visit and providing information:: patient Medicare Wellness Visit Mode:: Telephone If telephone:: video declined Since this visit was completed virtually, some vitals may be partially provided or unavailable. Missing vitals are due to the limitations of the virtual format.: Documented vitals are patient reported If Telephone or Video please confirm:: I connected with patient using audio/video enable telemedicine. I verified patient identity with two identifiers, discussed telehealth limitations, and patient agreed to proceed. Patient Location:: Home Provider Location:: Office Interpreter Needed?: No Pre-visit prep was completed: yes AWV questionnaire completed by patient prior to visit?: no Living arrangements:: (!) lives alone Patient's Overall Health Status Rating: good Typical amount of pain: none Does pain affect daily life?: no Are you currently prescribed opioids?: no  Dietary Habits and Nutritional Risks How many meals a day?: 3 Eats fruit and vegetables daily?: yes Most meals are obtained by: preparing own meals In the last 2 weeks, have you had any of the following?: none Diabetic:: no  Functional Status Activities of Daily Living (to include ambulation/medication): Independent Ambulation: Independent with device- listed below Home Assistive Devices/Equipment: Eyeglasses; Dentures (specify type) Medication Administration: Independent Home Management (perform basic housework or laundry): Independent Manage your own finances?: yes Primary transportation is: driving Concerns about vision?: no *vision screening is required for WTM* Concerns about hearing?: no  Fall Screening Falls  in the past year?: 0 Number of falls in past year: 0 Was there an injury with Fall?: 0 Fall Risk Category Calculator: 0 Patient Fall Risk Level: Low Fall Risk  Fall Risk Patient at Risk for Falls Due to: No Fall Risks Fall risk Follow up: Falls evaluation completed; Falls prevention discussed  Home and Transportation Safety: All rugs have non-skid backing?: N/A, no rugs All stairs or steps have railings?: yes Grab bars in the bathtub or shower?: (!) no Have non-skid surface in bathtub or shower?: yes Good home lighting?: yes Regular seat belt use?: yes Hospital stays in the last year:: no  Cognitive Assessment Difficulty concentrating, remembering, or making decisions? : no Will 6CIT or Mini Cog be Completed: yes What year is it?: 0 points What month is it?: 0 points Give patient an address phrase to remember (5 components): [address] [place], [place] About what time is it?: 0 points Count backwards from 20 to 1: 0 points Say the months of the year in reverse: 0 points Repeat the address phrase from earlier: 0 points 6 CIT Score: 0 points  Advance Directives (For Healthcare) Does Patient Have a Medical Advance Directive?: No Would patient like information on creating a medical advance directive?: Yes (MAU/Ambulatory/Procedural Areas - Information given)  Reviewed/Updated  Reviewed/Updated: Reviewed All (Medical, Surgical, Family, Medications, Allergies, Care Teams, Patient Goals)    Allergies (verified) Patient has no known allergies.   Current Medications (verified) Outpatient Encounter Medications as of 09/13/2024  Medication Sig   acetaminophen  (TYLENOL ) 500 MG tablet Take 500 mg by mouth every 8 (eight) hours as needed.   amLODipine  (NORVASC ) 5 MG tablet TAKE 1 TABLET(5 MG) BY MOUTH DAILY   diclofenac  Sodium (VOLTAREN ) 1 % GEL Apply 2 g topically 4 (four) times daily.   DULoxetine  (CYMBALTA ) 30 MG capsule Take 1 capsule (30 mg total) by  mouth daily.   EYSUVIS  0.25 % SUSP Apply 1 drop to eye 4 (four) times daily.   furosemide  (LASIX ) 20 MG tablet Take 2 tablets (40 mg total) by mouth daily.   gabapentin  (NEURONTIN ) 100 MG capsule TAKE 1 CAPSULE(100 MG) BY MOUTH AT BEDTIME   IYUZEH 0.005 % SOLN SMARTSIG:1 Drop(s) In Eye(s) Every Evening   neomycin -polymyxin-dexamethasone (MAXITROL) 0.1 % ophthalmic suspension Apply 1 drop to eye 2 (two) times daily.   olmesartan -hydrochlorothiazide  (BENICAR  HCT) 40-25 MG tablet TAKE 1 TABLET BY MOUTH DAILY   Polyethyl Glycol-Propyl Glycol (SYSTANE HYDRATION PF OP) Apply to eye. Dry eyes   Povidone (IVIZIA DRY EYES OP) Apply 1 drop to eye as needed.   ZIOPTAN 0.0015 % SOLN    No facility-administered encounter medications on file as of 09/13/2024.    History: Past Medical History:  Diagnosis Date   Arthritis    B knees.   Cataract    Glaucoma    Glucose intolerance (impaired glucose tolerance)    Hypertension    Renal insufficiency    Past Surgical History:  Procedure Laterality Date   CATARACT EXTRACTION Right 07-2014   CATARACT EXTRACTION Left 01/16/2016   COLONOSCOPY     10 +yrs ago in [place], normal per pt   VAGINAL DELIVERY     x2   Family History  Problem Relation Age of Onset   Diabetes Mother    Heart disease Mother        unsure   COPD Father    Diabetes Sister    Hypertension Sister    Kidney disease Sister    Diabetes Brother    Diabetes Brother    Colon cancer Neg Hx    Rectal cancer Neg Hx    Stomach cancer Neg Hx    Social History   Occupational History   Occupation: retired  Tobacco Use   Smoking status: Never   Smokeless tobacco: Never  Vaping Use   Vaping status: Never Used  Substance and Sexual Activity   Alcohol use: Not Currently    Alcohol/week: 6.0 standard drinks of alcohol    Types: 6 Standard drinks or equivalent per week    Comment: 2-3   Drug use: No   Sexual activity: Not Currently   Tobacco Counseling Counseling given: No  SDOH Screenings   Food  Insecurity: No Food Insecurity (09/13/2024)  Housing: Unknown (09/13/2024)  Transportation Needs: No Transportation Needs (09/13/2024)  Utilities: Not At Risk (09/13/2024)  Alcohol Screen: Low Risk (09/10/2023)  Depression (PHQ2-9): Low Risk (09/13/2024)  Financial Resource Strain: Low Risk (09/10/2023)  Physical Activity: Inactive (09/13/2024)  Social Connections: Moderately Isolated (09/13/2024)  Stress: No Stress Concern Present (09/13/2024)  Tobacco Use: Low Risk (09/13/2024)  Health Literacy: Adequate Health Literacy (09/13/2024)   See flowsheets for full screening details  Depression Screen PHQ 2 & 9 Depression Scale- Over the past 2 weeks, how often have you been bothered by any of the following problems? Little interest or pleasure in doing things: 0 Feeling down, depressed, or hopeless (PHQ Adolescent also includes...irritable): 0 PHQ-2 Total Score: 0 Trouble falling or staying asleep, or sleeping too much: 0 Feeling tired or having little energy: 0 Poor appetite or overeating (PHQ Adolescent also includes...weight loss): 0 Feeling bad about yourself - or that you are a failure or have let yourself or your family down: 0 Trouble concentrating on things, such as reading the newspaper or watching television (PHQ Adolescent also includes...like school work): 0 Moving or speaking so slowly  that other people could have noticed. Or the opposite - being so fidgety or restless that you have been moving around a lot more than usual: 0 Thoughts that you would be better off dead, or of hurting yourself in some way: 0 PHQ-9 Total Score: 0 If you checked off any problems, how difficult have these problems made it for you to do your work, take care of things at home, or get along with other people?: Not difficult at all  Depression Treatment Depression Interventions/Treatment : EYV7-0 Score <4 Follow-up Not Indicated; Medication; Currently on Treatment     Goals Addressed               This  Visit's Progress     Patient Stated (pt-stated)        Patient stated she plans to continue managing her leg discomfort and bp readings             Objective:    Today's Vitals   09/13/24 1133  Weight: 214 lb (97.1 kg)  Height: 5' 4 (1.626 m)   Body mass index is 36.73 kg/m.  Hearing/Vision screen Hearing Screening - Comments:: Denies hearing difficulties   Vision Screening - Comments:: Wears eyeglasses for reading- up to date with routine eye exams with [facility] Quan Immunizations and Health Maintenance Health Maintenance  Topic Date Due   COVID-19 Vaccine (7 - 2025-26 season) 04/17/2024   Medicare Annual Wellness (AWV)  09/13/2025   DTaP/Tdap/Td (3 - Td or Tdap) 06/02/2026   Pneumococcal Vaccine: 50+ Years  Completed   Influenza Vaccine  Completed   Bone Density Scan  Completed   Zoster Vaccines- Shingrix  Completed   Meningococcal B Vaccine  Aged Out   Colonoscopy  Discontinued        Assessment/Plan:  This is a routine wellness examination for [place].  Patient Care Team: Purcell Emil Schanz, MD as PCP - General (Internal Medicine) Quan Gaither, MD as Consulting Physician (Ophthalmology)  I have personally reviewed and noted the following in the patients chart:   Medical and social history Use of alcohol, tobacco or illicit drugs  Current medications and supplements including opioid prescriptions. Functional ability and status Nutritional status Physical activity Advanced directives List of other physicians Hospitalizations, surgeries, and ER visits in previous 12 months Vitals Screenings to include cognitive, depression, and falls Referrals and appointments  No orders of the defined types were placed in this encounter.  In addition, I have reviewed and discussed with patient certain preventive protocols, quality metrics, and best practice recommendations. A written personalized care plan for preventive services as well as general preventive  health recommendations were provided to patient.   Tammy Mccullough, CMA   09/13/2024   Return in 1 year (on 09/13/2025).  After Visit Summary: (MyChart) Due to this being a telephonic visit, the after visit summary with patients personalized plan was offered to patient via MyChart   Nurse Notes: scheduled 2027 AWV appt "

## 2024-09-13 NOTE — Patient Instructions (Signed)
 Ms. Scaff,  Thank you for taking the time for your Medicare Wellness Visit. I appreciate your continued commitment to your health goals. Please review the care plan we discussed, and feel free to reach out if I can assist you further.  Please note that Annual Wellness Visits do not include a physical exam. Some assessments may be limited, especially if the visit was conducted virtually. If needed, we may recommend an in-person follow-up with your provider.  Ongoing Care Seeing your primary care provider every 3 to 6 months helps us  monitor your health and provide consistent, personalized care.   Referrals If a referral was made during today's visit and you haven't received any updates within two weeks, please contact the referred provider directly to check on the status.  Recommended Screenings:  Health Maintenance  Topic Date Due   COVID-19 Vaccine (7 - 2025-26 season) 04/17/2024   Medicare Annual Wellness Visit  09/13/2025   DTaP/Tdap/Td vaccine (3 - Td or Tdap) 06/02/2026   Pneumococcal Vaccine for age over 60  Completed   Flu Shot  Completed   Osteoporosis screening with Bone Density Scan  Completed   Zoster (Shingles) Vaccine  Completed   Meningitis B Vaccine  Aged Out   Colon Cancer Screening  Discontinued       09/13/2024   11:35 AM  Advanced Directives  Does Patient Have a Medical Advance Directive? No  Would patient like information on creating a medical advance directive? Yes (MAU/Ambulatory/Procedural Areas - Information given)    Vision: Annual vision screenings are recommended for early detection of glaucoma, cataracts, and diabetic retinopathy. These exams can also reveal signs of chronic conditions such as diabetes and high blood pressure.  Dental: Annual dental screenings help detect early signs of oral cancer, gum disease, and other conditions linked to overall health, including heart disease and diabetes.

## 2024-09-25 ENCOUNTER — Ambulatory Visit: Admitting: Emergency Medicine

## 2025-02-01 ENCOUNTER — Ambulatory Visit: Admitting: Emergency Medicine

## 2025-07-30 ENCOUNTER — Encounter: Admitting: Emergency Medicine

## 2025-09-14 ENCOUNTER — Ambulatory Visit
